# Patient Record
Sex: Female | Born: 1950
Health system: Southern US, Community
[De-identification: ages and names within clinical notes are randomized; demographics above are authoritative.]

## PROBLEM LIST (undated history)

## (undated) DIAGNOSIS — I219 Acute myocardial infarction, unspecified: Secondary | ICD-10-CM

## (undated) DIAGNOSIS — T8859XA Other complications of anesthesia, initial encounter: Secondary | ICD-10-CM

## (undated) DIAGNOSIS — E785 Hyperlipidemia, unspecified: Secondary | ICD-10-CM

## (undated) DIAGNOSIS — T7840XA Allergy, unspecified, initial encounter: Secondary | ICD-10-CM

## (undated) DIAGNOSIS — N390 Urinary tract infection, site not specified: Secondary | ICD-10-CM

## (undated) DIAGNOSIS — T4145XA Adverse effect of unspecified anesthetic, initial encounter: Secondary | ICD-10-CM

## (undated) DIAGNOSIS — H269 Unspecified cataract: Secondary | ICD-10-CM

## (undated) DIAGNOSIS — N393 Stress incontinence (female) (male): Secondary | ICD-10-CM

## (undated) DIAGNOSIS — Z9889 Other specified postprocedural states: Secondary | ICD-10-CM

## (undated) DIAGNOSIS — R112 Nausea with vomiting, unspecified: Secondary | ICD-10-CM

## (undated) DIAGNOSIS — M1990 Unspecified osteoarthritis, unspecified site: Secondary | ICD-10-CM

## (undated) DIAGNOSIS — R809 Proteinuria, unspecified: Secondary | ICD-10-CM

## (undated) DIAGNOSIS — K219 Gastro-esophageal reflux disease without esophagitis: Secondary | ICD-10-CM

## (undated) DIAGNOSIS — I209 Angina pectoris, unspecified: Secondary | ICD-10-CM

## (undated) HISTORY — PX: CARDIAC CATHETERIZATION: SHX172

## (undated) HISTORY — DX: Hyperlipidemia, unspecified: E78.5

## (undated) HISTORY — PX: POLYPECTOMY: SHX149

## (undated) HISTORY — DX: Allergy, unspecified, initial encounter: T78.40XA

## (undated) HISTORY — PX: CYSTOSCOPY: SUR368

## (undated) HISTORY — DX: Unspecified cataract: H26.9

## (undated) HISTORY — PX: COLONOSCOPY: SHX174

## (undated) HISTORY — PX: ABDOMINAL HYSTERECTOMY: SHX81

## (undated) HISTORY — PX: BACK SURGERY: SHX140

---

## 1998-04-19 ENCOUNTER — Ambulatory Visit (HOSPITAL_COMMUNITY): Admission: RE | Admit: 1998-04-19 | Discharge: 1998-04-19 | Payer: Self-pay

## 1999-04-15 ENCOUNTER — Ambulatory Visit (HOSPITAL_COMMUNITY): Admission: RE | Admit: 1999-04-15 | Discharge: 1999-04-15 | Payer: Self-pay | Admitting: Family Medicine

## 2000-02-21 ENCOUNTER — Emergency Department (HOSPITAL_COMMUNITY): Admission: EM | Admit: 2000-02-21 | Discharge: 2000-02-21 | Payer: Self-pay

## 2000-03-31 ENCOUNTER — Encounter: Payer: Self-pay | Admitting: Family Medicine

## 2000-03-31 ENCOUNTER — Encounter: Admission: RE | Admit: 2000-03-31 | Discharge: 2000-03-31 | Payer: Self-pay | Admitting: Family Medicine

## 2000-04-29 ENCOUNTER — Encounter: Payer: Self-pay | Admitting: Family Medicine

## 2000-04-29 ENCOUNTER — Ambulatory Visit (HOSPITAL_COMMUNITY): Admission: RE | Admit: 2000-04-29 | Discharge: 2000-04-29 | Payer: Self-pay | Admitting: Family Medicine

## 2000-05-13 ENCOUNTER — Encounter: Payer: Self-pay | Admitting: Family Medicine

## 2000-05-13 ENCOUNTER — Ambulatory Visit (HOSPITAL_COMMUNITY): Admission: RE | Admit: 2000-05-13 | Discharge: 2000-05-13 | Payer: Self-pay | Admitting: Family Medicine

## 2000-05-27 ENCOUNTER — Ambulatory Visit (HOSPITAL_COMMUNITY): Admission: RE | Admit: 2000-05-27 | Discharge: 2000-05-27 | Payer: Self-pay | Admitting: Family Medicine

## 2000-05-27 ENCOUNTER — Encounter: Payer: Self-pay | Admitting: Family Medicine

## 2000-08-11 ENCOUNTER — Encounter: Payer: Self-pay | Admitting: Family Medicine

## 2000-08-11 ENCOUNTER — Ambulatory Visit (HOSPITAL_COMMUNITY): Admission: RE | Admit: 2000-08-11 | Discharge: 2000-08-11 | Payer: Self-pay | Admitting: Family Medicine

## 2002-09-08 ENCOUNTER — Ambulatory Visit (HOSPITAL_COMMUNITY): Admission: RE | Admit: 2002-09-08 | Discharge: 2002-09-08 | Payer: Self-pay | Admitting: Family Medicine

## 2002-09-08 ENCOUNTER — Encounter: Payer: Self-pay | Admitting: Family Medicine

## 2002-09-21 ENCOUNTER — Encounter: Admission: RE | Admit: 2002-09-21 | Discharge: 2002-09-21 | Payer: Self-pay | Admitting: Family Medicine

## 2002-09-21 ENCOUNTER — Encounter: Payer: Self-pay | Admitting: Family Medicine

## 2003-10-21 DIAGNOSIS — I219 Acute myocardial infarction, unspecified: Secondary | ICD-10-CM

## 2003-10-21 HISTORY — DX: Acute myocardial infarction, unspecified: I21.9

## 2004-08-15 ENCOUNTER — Other Ambulatory Visit: Admission: RE | Admit: 2004-08-15 | Discharge: 2004-08-15 | Payer: Self-pay | Admitting: Gynecology

## 2004-08-20 ENCOUNTER — Ambulatory Visit (HOSPITAL_COMMUNITY): Admission: RE | Admit: 2004-08-20 | Discharge: 2004-08-20 | Payer: Self-pay | Admitting: Family Medicine

## 2004-09-02 ENCOUNTER — Ambulatory Visit (HOSPITAL_COMMUNITY): Admission: RE | Admit: 2004-09-02 | Discharge: 2004-09-02 | Payer: Self-pay | Admitting: Urology

## 2004-09-06 ENCOUNTER — Ambulatory Visit: Payer: Self-pay | Admitting: Oncology

## 2004-10-28 ENCOUNTER — Observation Stay (HOSPITAL_COMMUNITY): Admission: EM | Admit: 2004-10-28 | Discharge: 2004-10-29 | Payer: Self-pay | Admitting: Emergency Medicine

## 2005-02-20 ENCOUNTER — Emergency Department (HOSPITAL_COMMUNITY): Admission: EM | Admit: 2005-02-20 | Discharge: 2005-02-20 | Payer: Self-pay | Admitting: Emergency Medicine

## 2005-03-24 ENCOUNTER — Ambulatory Visit: Payer: Self-pay | Admitting: Gastroenterology

## 2006-09-01 ENCOUNTER — Ambulatory Visit (HOSPITAL_COMMUNITY): Admission: RE | Admit: 2006-09-01 | Discharge: 2006-09-01 | Payer: Self-pay | Admitting: Family Medicine

## 2006-09-03 ENCOUNTER — Other Ambulatory Visit: Admission: RE | Admit: 2006-09-03 | Discharge: 2006-09-03 | Payer: Self-pay | Admitting: Family Medicine

## 2007-06-28 ENCOUNTER — Ambulatory Visit (HOSPITAL_COMMUNITY): Admission: RE | Admit: 2007-06-28 | Discharge: 2007-06-28 | Payer: Self-pay | Admitting: Gynecology

## 2007-08-23 ENCOUNTER — Ambulatory Visit (HOSPITAL_COMMUNITY): Admission: RE | Admit: 2007-08-23 | Discharge: 2007-08-23 | Payer: Self-pay | Admitting: Gynecology

## 2007-09-06 ENCOUNTER — Ambulatory Visit (HOSPITAL_COMMUNITY): Admission: RE | Admit: 2007-09-06 | Discharge: 2007-09-06 | Payer: Self-pay | Admitting: Family Medicine

## 2007-12-28 ENCOUNTER — Ambulatory Visit: Payer: Self-pay | Admitting: Gastroenterology

## 2008-01-11 ENCOUNTER — Ambulatory Visit: Payer: Self-pay | Admitting: Gastroenterology

## 2008-01-11 ENCOUNTER — Encounter: Payer: Self-pay | Admitting: Gastroenterology

## 2009-05-17 ENCOUNTER — Ambulatory Visit (HOSPITAL_COMMUNITY): Admission: RE | Admit: 2009-05-17 | Discharge: 2009-05-18 | Payer: Self-pay | Admitting: Neurological Surgery

## 2009-10-16 ENCOUNTER — Inpatient Hospital Stay (HOSPITAL_COMMUNITY): Admission: RE | Admit: 2009-10-16 | Discharge: 2009-10-19 | Payer: Self-pay | Admitting: Neurological Surgery

## 2010-11-09 ENCOUNTER — Encounter: Payer: Self-pay | Admitting: *Deleted

## 2010-11-09 ENCOUNTER — Encounter: Payer: Self-pay | Admitting: Family Medicine

## 2010-11-10 ENCOUNTER — Encounter: Payer: Self-pay | Admitting: Family Medicine

## 2010-11-10 ENCOUNTER — Encounter: Payer: Self-pay | Admitting: Gynecology

## 2011-01-20 LAB — CBC
HCT: 39.9 % (ref 36.0–46.0)
Hemoglobin: 13.6 g/dL (ref 12.0–15.0)
MCHC: 34.2 g/dL (ref 30.0–36.0)
MCV: 89.8 fL (ref 78.0–100.0)
Platelets: 254 10*3/uL (ref 150–400)
RBC: 4.44 MIL/uL (ref 3.87–5.11)
RDW: 13.1 % (ref 11.5–15.5)
WBC: 5.2 10*3/uL (ref 4.0–10.5)

## 2011-01-20 LAB — ABO/RH: ABO/RH(D): A POS

## 2011-01-20 LAB — GLUCOSE, CAPILLARY
Glucose-Capillary: 110 mg/dL — ABNORMAL HIGH (ref 70–99)
Glucose-Capillary: 116 mg/dL — ABNORMAL HIGH (ref 70–99)
Glucose-Capillary: 121 mg/dL — ABNORMAL HIGH (ref 70–99)
Glucose-Capillary: 130 mg/dL — ABNORMAL HIGH (ref 70–99)
Glucose-Capillary: 136 mg/dL — ABNORMAL HIGH (ref 70–99)

## 2011-01-20 LAB — TYPE AND SCREEN
ABO/RH(D): A POS
Antibody Screen: NEGATIVE

## 2011-01-20 LAB — BASIC METABOLIC PANEL
BUN: 13 mg/dL (ref 6–23)
CO2: 23 mEq/L (ref 19–32)
Calcium: 9.2 mg/dL (ref 8.4–10.5)
Chloride: 106 mEq/L (ref 96–112)
Creatinine, Ser: 0.7 mg/dL (ref 0.4–1.2)
GFR calc Af Amer: >60 mL/min (ref >60)
GFR calc non Af Amer: >60 mL/min (ref >60)
Glucose, Bld: 141 mg/dL — ABNORMAL HIGH (ref 70–99)
Potassium: 4.4 mEq/L (ref 3.5–5.1)
Sodium: 139 mEq/L (ref 135–145)

## 2011-01-26 LAB — BASIC METABOLIC PANEL
CO2: 28 mEq/L (ref 19–32)
Calcium: 9.5 mg/dL (ref 8.4–10.5)
Chloride: 107 mEq/L (ref 96–112)
Creatinine, Ser: 0.69 mg/dL (ref 0.4–1.2)
GFR calc Af Amer: >60 mL/min (ref >60)
Glucose, Bld: 162 mg/dL — ABNORMAL HIGH (ref 70–99)
Sodium: 140 mEq/L (ref 135–145)

## 2011-01-26 LAB — GLUCOSE, CAPILLARY
Glucose-Capillary: 120 mg/dL — ABNORMAL HIGH (ref 70–99)
Glucose-Capillary: 125 mg/dL — ABNORMAL HIGH (ref 70–99)
Glucose-Capillary: 163 mg/dL — ABNORMAL HIGH (ref 70–99)

## 2011-01-26 LAB — CBC
Hemoglobin: 13.9 g/dL (ref 12.0–15.0)
MCHC: 34.6 g/dL (ref 30.0–36.0)
MCV: 89 fL (ref 78.0–100.0)
RBC: 4.52 MIL/uL (ref 3.87–5.11)
RDW: 13.3 % (ref 11.5–15.5)

## 2011-03-04 NOTE — Op Note (Signed)
NAMEALISI, LUPIEN            ACCOUNT NO.:  [id number]   MEDICAL RECORD NO.:  [id number]          PATIENT TYPE:  OIB   LOCATION:  3535                         FACILITY:  [facility]   PHYSICIAN:  Stefani Dama, M.D.  DATE OF BIRTH:  24-Feb-1951   DATE OF PROCEDURE:  05/17/2009  DATE OF DISCHARGE:                               OPERATIVE REPORT   PREOPERATIVE DIAGNOSES:  Herniated nucleus pulposus, spondylosis L5-S1  with right lumbar radiculopathy.   POSTOPERATIVE DIAGNOSES:  Herniated nucleus pulposus, spondylosis L5-S1  with right lumbar radiculopathy.   PROCEDURE:  Right METRx laminotomy and foraminotomy at L5-S1 with  operating microscope, microdissection technique.   SURGEON:  Stefani Dama, MD   FIRST ASSISTANT:  Cristi Loron, MD   ANESTHESIA:  General endotracheal.   INDICATIONS:  Jennice Renegar is a 60 year old individual who has had  problems with her right lumbar radiculopathy.  She has spondylitic  change at the L5-S1 level and has the lateral recess stenosis that is  felt to be due to a chronic disk herniation with perhaps in acute  fragment.  This problem has been refractory to conservative measures of  all sorts over period of a couple of years.  Because she has had  persistence of L5 and S1 radicular syndrome, she is been advised  regarding surgical decompression.   PROCEDURE:  The patient was brought to the operating room supine on the  stretcher.  After smooth induction of general endotracheal anesthesia,  she was turned prone.  The back was prepped with alcohol and DuraPrep  and draped in a sterile fashion.  Fluoroscopic guidance was used to  localize the L5-S1 interspace on the right side and then the skin above  this area was infiltrated with 10 mL of lidocaine with epinephrine mixed  50:50 with 0.5% Marcaine.  A small incision was created over the area of  the laminotomy and then a K-wire was passed under fluoroscopic guidance  to the laminar  arch of L5.  A #15 blade was used to make a small  vertical incision in the lumbodorsal fascia and then a series of  dilators were passed and a winding technique was used to dilate the  prevertebral area and the interlaminar space at L5-S1 using again  fluoroscopic guide.  The wound was then secured with an 18 mm diameter x  5 cm deep endoscopic cannula which was fixed to the operating table with  a clamp.  The inner tubes were removed and the microscope was draped and  brought into the field to look into the small aperture to the  interlaminar space at L5-S1.  Then, a monopolar cautery was used to  clear the soft tissues from the edges of the bone and to expose the L5  laminar arch and including the medial wall of the facet.  A high-speed  drill and 2.3-mm dissecting tool was then used to clear the inferior  margin of the lamina and medial wall of the facet and large laminotomy.  The 2 and 3 mm Kerrison punches were then used to lift and remove the  yellow ligament.  In the lateral recess, the S1 nerve root was  identified being flattened over a significant mass ventral to it.  Once  an adequate opening in the yellow ligament and partial resection of the  facet joint was obtained, the nerve root could be mobilized medially and  then the bipolar cautery was used to cauterize some epidural veins.  The  nerve root was retracted medially and the underlying this was noted to  be the disk space.  There was a solid ridge of bone in the inferior  margin body of L5 to the superior margin of the sacrum.  Palpation and  exploration of this yielded no evidence of loosened ligament or fragment  of disk.  This was felt to be a chronic process that was quite hardened.  In the light of the fact that the ligament was intact over this bony  ridge, a diskectomy was not performed.  However, a more generous  foraminotomy was performed to decompress the S1 nerve root both  inferiorly and superiorly.  Once the  lateral recess was cleared and the  area was secured, hemostasis in the soft tissues was obtained  meticulously.  The microscope was removed from the field.  The  endoscopic cannula was removed and then the lumbodorsal fascia was  closed with 3-0 Vicryl sutures in an interrupted fashion, 3-0 Vicryl  using subcuticular tissues, and Dermabond was used on the skin.  Blood  loss was nil.  The patient tolerated the procedure well and was returned  to the recovery room in stable condition.      Stefani Dama, M.D.  Electronically Signed     HJE/MEDQ  D:  05/17/2009  T:  05/18/2009  Job:  [id number]

## 2011-03-07 NOTE — H&P (Signed)
NAMEMAEDELL, HEDGER            ACCOUNT NO.:  [id number]   MEDICAL RECORD NO.:  [id number]          PATIENT TYPE:  EMS   LOCATION:  ED                           FACILITY:  [facility]   PHYSICIAN:  Sherin Quarry, MD      DATE OF BIRTH:  05-22-1951   DATE OF ADMISSION:  10/27/2004  DATE OF DISCHARGE:                                HISTORY & PHYSICAL   CHIEF COMPLAINT:  Regina Kim is a 60 year old lady who is generally in  good health. She reports that at about 6 p.m. tonight, she ate two bites of  pizza and then began to experience severe substernal chest pain radiating to  her right shoulder and down her right arm.  This was associated with nausea  and diaphoresis.  There was no associated vomiting.  The pain persisted and  eventually her husband brought her to the emergency room about 8 p.m.Marland Kitchen  At  that time she received one sublingual nitroglycerin which apparently did not  have any effect on her symptoms.  She says that her husband then began  rubbing her back and that seemed to make the pain gradually resolve.  At  this time she is having no chest pain or shortness of breath.  She feels as  though there is a burning sensation in the area between her shoulder blades.  Initial cardiac enzymes were remarkable for troponin value of 0.28 on the  initial specimen.  Subsequent specimens were 0.13 and 0.13.  The patient  will, therefore, be admitted at this time for evaluation of this episode of  chest pain.  While in the emergency room, a chest x-ray was obtained which  was remarkable for evidence of bronchitis.  Also a CT scan of the chest was  done which showed no evidence of  pulmonary embolus.  Also possibly of  significance is that the patient has had a two-week history of coughing.  Her cough is described as nonproductive.  She has presented to Dr. Pablo Lawrence  office about 10 days ago and was treated with Tussionex cough syrup and Z-  pack.  She has continued to have some  coughing.   PAST MEDICAL HISTORY:  She had had a hysterectomy in the past.  She cannot  recall having had any other operations.  There have been no other medical  illnesses.   MEDICATIONS:  1.  Prilosec over-the-counter one tablet daily.  2.  A fiber supplement.  3.  Ibuprofen 800 mg p.r.n. for back pain.   FAMILY HISTORY:  Significant in that the patient has seven brothers and four  sisters. Three of her brothers have had myocardial infarctions.  Two of her  brothers have had CABG.  Her father died at the age of 53 of an MI.   SOCIAL HISTORY:  The patient states that she does not smoke or abuse  alcohol.  She has not used any other drugs.  She lives with her husband.  She indicates that she is very sedentary, does not do any type of regular  exercise.   REVIEW OF SYSTEMS:  HEAD:  She denies headache or  dizziness.  EYES:  She  denies visual, blurring or diplopia.  ENT:  Denies earaches, sinus pain or  sore throat.  CHEST:  Denies productive cough.  Otherwise see above.  CARDIOVASCULAR:  Denies orthopnea, PND or ankle edema.  GI:  There has been  no history of acid reflux.  She denies chronic abdominal pain.  There has  been no hematemesis or melena.  GU:  Denies dysuria, urinary frequency.  NEUROLOGIC: Denies history of seizure or stroke.  ENDO:  Denies excessive  urinary frequency or nocturia.   PHYSICAL EXAMINATION:  GENERAL:  She is an obese woman who is currently  feeling well.  VITAL SIGNS:  Temperature 98.4, blood pressure 142/98, pulse 66,  respirations 18, O2 saturation 99%.  HEENT: Within normal limits.  CHEST:  Clear to auscultation and percussion.  BACK:  Examination of the back reveals no CVA or point tenderness.  CARDIOVASCULAR:  Normal S1 and S2.  No murmurs, rubs, or gallops.  ABDOMEN:  Benign.  Audible bowel sounds. No masses or tenderness.  No  guarding or rebound.  NEUROLOGIC: On neurologic testing, examination of the extremities is normal.   LABORATORY  DATA:  Remarkable for a CBC which shows hemoglobin 12.3, white  count 5900.  Sodium 138, potassium 3.6.  Of note is that the glucose is 142.  Troponin values are noted above.   IMPRESSION:  1.  Chest pain with abnormal troponin.  It is very significant that this      patient also has a strong family heart disease.  With these      considerations, the patient needs to be admitted for further evaluation      of her complaints of chest pain.  2.  History of gestational diabetes.  3.  Abnormal glucose on admission.  4.  Status post hysterectomy.   The patient will be admitted for telemetry monitoring.  Will obtain  additional cardiac enzymes values.  Standard rule out myocardial infarction  protocol will be initiated.  The patient will be placed on aspirin and  Lovenox.  Lopressor 25 mg every 12 hours will be initiated.  Consultation  will be obtained with [facility] cardiology for further evaluation, presumably  for Cardiolite stress.      SY/MEDQ  D:  10/28/2004  T:  10/28/2004  Job:  [id number]   cc:   Molly Maduro L. Foy Guadalajara, M.D.  [address] [place]  [place] [address]  Fax: [phone]

## 2011-03-07 NOTE — Consult Note (Signed)
[name], [name]            ACCOUNT NO.:  [id number]   MEDICAL RECORD NO.:  [id number]          PATIENT TYPE:  INP   LOCATION:  6524                         FACILITY:  [facility]   PHYSICIAN:  Meade Maw, M.D.    DATE OF BIRTH:  Nov 18, 1950   DATE OF CONSULTATION:  10/28/2004  DATE OF DISCHARGE:                                   CONSULTATION   REASON FOR CONSULTATION:  Chest pain.   Regina Kim is a very pleasant 60 year old female who presented to the emergency  room  following a two hour episode of substernal chest pain which initially  started as a pain in her right shoulder and back which radiated to her  front.  This was associated with nausea and diaphoresis.  The patient  actually went outside in an attempt to cool down.  The pain persisted.  Her  husband took her blood pressure and noted it was 185/100.  She subsequently  decided to come to the emergency room.  In the emergency room, she received  1 sublingual nitroglycerin.  The pain gradually resolved.  She is unsure as  to what was the alleviating factor.  She has had no further chest pain since  her admission.  Her initial cardiac enzymes were remarkable for an elevated  troponin as well as an elevated CK of 314 with an MB fraction of 31.  Her  relative index was 9.9.  Her troponin I is 1.99.   PAST MEDICAL HISTORY:  1.  Significant for recent upper respiratory infection.  She was treated for      bronchitis as an outpatient with a Z-Pak.  2.  Gastritis.   PAST SURGICAL HISTORY:  Significant for a hysterectomy.   CURRENT MEDICATIONS:  Prilosec over the counter, ibuprofen p.r.n. for back  pain.   FAMILY HISTORY:  Significant in that the patient has seven brothers and four  sisters, three of the brothers have had myocardial infarction, two of her  brothers have had coronary artery bypass graft.  Her father passed at age 25  from myocardial infarction.   SOCIAL HISTORY:  The patient stopped smoking in September 2005,  no history  of alcohol or illicit drug use.  She lives with her husband.  She works as  Hotel manager.  She is not involved in a regular exercise program, but  she states she is active with her job.   REVIEW OF SYMPTOMS:  As noted above, significant for recent bronchitis, she  has had no palpitations, no presyncope, and no syncope.  No orthopnea, no  pedal edema.   PHYSICAL EXAMINATION:  GENERAL:  Middle aged female in no acute distress.  VITAL SIGNS:  She is afebrile.  Her systolic pressure has ranged from 135 to  140 with a diastolic of 70 to 80.  Heart rate has been in the 60s to 70s.  Her O2 saturation has been 98% on 2 liters.  HEENT:  Unremarkable.  PULMONARY EXAM:  Breath sounds which are equal and clear to auscultation.  No use of accessory muscles.  CARDIOVASCULAR EXAM:  Regular rate and rhythm, normal S1 and  normal S2, no  murmurs, gallops, and rubs noted.  ABDOMEN:  Soft, benign, nontender.  EXTREMITIES:  Distal pulses which are equal and palpable.   LABORATORY DATA:  White count 5.9, hemoglobin 12.3, hematocrit 36, platelet  count 206.  Sodium 138, potassium 3.6, creatinine 0.8.  CK scan of the chest  was performed and this was noted to be negative for acute pulmonary embolus.   MEDICATIONS SINCE ADMISSION:  Lovenox per pharmacy protocol, Lopressor 25 mg  p.o. q.12h., Tylenol p.r.n., sublingual nitroglycerin, Restoril p.o. q.8h.  p.r.n., Ativan p.r.n., Protonix 40 mg daily.   IMPRESSION:  76.  60 year old female with a strong family history of coronary artery      disease and remote history of tobacco use.  She has had elevations in      her troponin Is as well as her Cks.  Left heart catheterization has been      recommended.  The risks, benefits, and options have been discussed.  The      patient wishes to proceed with left heart catheterization.  I will      premedicate with prednisone secondary to questionable allergy to      anesthetic material, drugs unknown.   The patient reports that she had a      respiratory affect, therefore, will treat empirically.  2.  Hypertension, blood pressure is currently well controlled.  3.  Health maintenance, cholesterol profile will need to be obtained for      further evaluation.      HP/MEDQ  D:  10/28/2004  T:  10/28/2004  Job:  [id number]   cc:   Sherin Quarry, MD   Doris Cheadle. Foy Guadalajara, M.D.  [address] [place]  [place] [address]  Fax: [phone]

## 2011-03-07 NOTE — Cardiovascular Report (Signed)
NAMEGERARDO, TERRITO            ACCOUNT NO.:  [id number]   MEDICAL RECORD NO.:  [id number]          PATIENT TYPE:  INP   LOCATION:  6524                         FACILITY:  [facility]   PHYSICIAN:  Meade Maw, M.D.    DATE OF BIRTH:  09/26/1951   DATE OF PROCEDURE:  10/29/2004  DATE OF DISCHARGE:                              CARDIAC CATHETERIZATION   REASON FOR PROCEDURE:  Chest pain with abnormal troponins, elevation of CK-  MB and ST depression noted on 12-lead EKG.   PROCEDURE:  After obtaining written informed consent, the patient was  brought to the cardiac catheterization lab in the post-absorptive state.  Preoperative sedation was achieved using Valium p.o. The patient was  premedicated for possible allergic reaction to IV sedation with prednisone  60 mg p.o. 18 hours prior to the procedure. The right groin was prepped and  draped in the usual sterile fashion. Local anesthesia was achieved using 1%  Xylocaine. A 6-French hemostasis sheath was placed into the right femoral  artery using a modified Seldinger technique. Selective coronary angiography  was performed using a 6-French JL4 and a non-torque JR4 catheter. Multiple  views were obtained. All catheter exchanges were made over a guide wire. The  hemostasis sheath was placed on each catheter exchange. Single plane  ventriculogram was performed in the RAO position using a 6-French pigtail  curved catheter. There was no immediate complications. The patient was  transferred to the holding area. Hemostasis was achieved using FemoStop  device. There was no immediate complications.   FINDINGS:  LV pressure was 130/10, EDP 17, aortic pressure was 125/75.  Single plane ventriculogram revealed normal wall motion, ejection fraction  of 75%.   Coronary angiography:  The left main coronary artery bifurcates into the  left anterior descending and circumflex vessel. There is no disease noted in  the left main coronary artery.   Left  anterior descending:  The left anterior descending is a large caliber  artery, gives rise to a large D1. Trivial D2 goes on to end as an apical  branch. There is no disease noted in the left anterior artery descending or  its branches.   Circumflex vessel:  Circumflex vessel is codominant for the posterior  circulation and gives rise to a trivial OM1, small OM2, small OM3, large  OM4, large OM5. It then goes on to end as an AV groove vessel. There is no  disease noted in the circumflex or its branches.   Right coronary artery:  Right coronary artery is codominant for the  posterior circulation and gives rise to a trivial RV marginal, a small to  moderate PDA, and a moderate PL branch. There is luminal irregularities in  the proximal right ________________.   FINAL IMPRESSION:  1.  Normal coronary angiography. Normal single plane ventriculogram.      Ejection fraction 65%.  2.  Elevation in cardiac enzymes possibly from coronary spasm. Will monitor      at this time. May need to consider calcium-channel blockers should the      patient have recurrence. We will also obtain repeat cardiac enzymes in  two weeks to determine if the patient is someone who has a false      positive elevation at baseline.      HP/MEDQ  D:  10/29/2004  T:  10/29/2004  Job:  [id number]   cc:   Molly Maduro L. Foy Guadalajara, M.D.  [address] [place]  [place] [address]  Fax: [phone]

## 2011-05-01 ENCOUNTER — Encounter (HOSPITAL_COMMUNITY)
Admission: RE | Admit: 2011-05-01 | Discharge: 2011-05-01 | Disposition: A | Discharge status: Home / Self Care | Payer: 59 | Source: Ambulatory Visit | Attending: Obstetrics and Gynecology | Admitting: Obstetrics and Gynecology

## 2011-05-01 ENCOUNTER — Encounter (HOSPITAL_COMMUNITY): Payer: Self-pay

## 2011-05-01 ENCOUNTER — Other Ambulatory Visit: Payer: Self-pay

## 2011-05-01 HISTORY — DX: Adverse effect of unspecified anesthetic, initial encounter: T41.45XA

## 2011-05-01 HISTORY — DX: Other complications of anesthesia, initial encounter: T88.59XA

## 2011-05-01 HISTORY — DX: Gastro-esophageal reflux disease without esophagitis: K21.9

## 2011-05-01 HISTORY — DX: Acute myocardial infarction, unspecified: I21.9

## 2011-05-01 HISTORY — DX: Angina pectoris, unspecified: I20.9

## 2011-05-01 HISTORY — DX: Unspecified osteoarthritis, unspecified site: M19.90

## 2011-05-01 LAB — CBC
Hemoglobin: 12.6 g/dL (ref 12.0–15.0)
MCH: 29.4 pg (ref 26.0–34.0)
MCHC: 32.8 g/dL (ref 30.0–36.0)
Platelets: 212 10*3/uL (ref 150–400)
RDW: 12.9 % (ref 11.5–15.5)

## 2011-05-01 LAB — BASIC METABOLIC PANEL
CO2: 24 mEq/L (ref 19–32)
Calcium: 9.4 mg/dL (ref 8.4–10.5)
Creatinine, Ser: 0.85 mg/dL (ref 0.50–1.10)
GFR calc non Af Amer: >60 mL/min (ref >60)
Sodium: 138 mEq/L (ref 135–145)

## 2011-05-01 LAB — URINALYSIS, ROUTINE W REFLEX MICROSCOPIC
Glucose, UA: NEGATIVE mg/dL
Ketones, ur: NEGATIVE mg/dL
Leukocytes, UA: NEGATIVE
Nitrite: NEGATIVE
Protein, ur: NEGATIVE mg/dL
Urobilinogen, UA: 0.2 mg/dL (ref 0.0–1.0)

## 2011-05-01 NOTE — Patient Instructions (Signed)
20 Regina Kim  05/01/2011   Your procedure is scheduled on:  05/08/11  Report to [facility] at 0600 AM.  Call this number if you have problems the morning of surgery: [phone]   Remember:   Do not eat food:After Midnight.  Do not drink clear liquids: After Midnight.  Take these medicines the morning of surgery with A SIP OF WATER: none   Do not wear jewelry, make-up or nail polish.  Do not bring valuables to the hospital.  Contacts, dentures or bridgework may not be worn into surgery.  Leave suitcase in the car. After surgery it may be brought to your room.  For patients admitted to the hospital, checkout time is 11:00 AM the day of discharge.   Patients discharged the day of surgery will not be allowed to drive home.  Name and phone number of your driver: Regina Kim- spouse  Special Instructions:     Please read over the following fact sheets that you were given: none

## 2011-05-01 NOTE — Anesthesia Preprocedure Evaluation (Addendum)
Anesthesia Evaluation  Name, MR# and DOB Patient awake  General Assessment Comment  Reviewed: Allergy & Precautions, H&P  and Patient's Chart, lab work & pertinent test results  History of Anesthesia Complications History of anesthetic complications: Asthmatic Attack with cystoscopy 30 yrs ago. Was a smoker at that time.  Airway Mallampati: II TM Distance: >3 FB Neck ROM: Full    Dental  (+) Teeth Intact   Pulmonaryneg pulmonary ROS      pulmonary exam normal   Cardiovascular Regular Normal   Neuro/PsychNegative Neurological ROS Negative Psych ROS  GI/Hepatic/Renal negative GI ROS, negative Liver ROS, and negative Renal ROS (+)       Endo/Other  Negative Endocrine ROS (+)   Abdominal Normal abdominal exam  (+)   Musculoskeletal negative musculoskeletal ROS (+)  Hematology negative hematology ROS (+)   Peds  Reproductive/Obstetrics negative OB ROS   Anesthesia Other Findings             Anesthesia Physical Anesthesia Plan  ASA: II  Anesthesia Plan: General   Post-op Pain Management:    Induction: Intravenous  Airway Management Planned: LMA  Additional Equipment:   Intra-op Plan:   Post-operative Plan:   Informed Consent: I have reviewed the patients History and Physical, chart, labs and discussed the procedure including the risks, benefits and alternatives for the proposed anesthesia with the patient or authorized representative who has indicated his/her understanding and acceptance.   Dental advisory given  Plan Discussed with: Anesthesiologist (AP)  Anesthesia Plan Comments:         Anesthesia Quick Evaluation

## 2011-05-08 ENCOUNTER — Encounter (HOSPITAL_COMMUNITY): Payer: Self-pay | Admitting: Anesthesiology

## 2011-05-08 ENCOUNTER — Encounter (HOSPITAL_COMMUNITY): Payer: Self-pay | Admitting: Obstetrics and Gynecology

## 2011-05-08 ENCOUNTER — Ambulatory Visit (HOSPITAL_COMMUNITY): Payer: 59 | Admitting: Anesthesiology

## 2011-05-08 ENCOUNTER — Ambulatory Visit (HOSPITAL_COMMUNITY)
Admission: AD | Admit: 2011-05-08 | Discharge: 2011-05-08 | Disposition: A | Discharge status: Home / Self Care | Payer: 59 | Source: Ambulatory Visit | Attending: Obstetrics and Gynecology | Admitting: Obstetrics and Gynecology

## 2011-05-08 ENCOUNTER — Encounter (HOSPITAL_COMMUNITY)
Admission: AD | Disposition: A | Discharge status: Home / Self Care | Payer: Self-pay | Source: Ambulatory Visit | Attending: Obstetrics and Gynecology

## 2011-05-08 DIAGNOSIS — Z01812 Encounter for preprocedural laboratory examination: Secondary | ICD-10-CM | POA: Insufficient documentation

## 2011-05-08 DIAGNOSIS — Z01818 Encounter for other preprocedural examination: Secondary | ICD-10-CM | POA: Insufficient documentation

## 2011-05-08 DIAGNOSIS — N393 Stress incontinence (female) (male): Secondary | ICD-10-CM | POA: Diagnosis present

## 2011-05-08 HISTORY — DX: Stress incontinence (female) (male): N39.3

## 2011-05-08 HISTORY — PX: BLADDER SURGERY: SHX569

## 2011-05-08 HISTORY — PX: BLADDER SUSPENSION: SHX72

## 2011-05-08 SURGERY — URETHROPEXY, USING TRANSVAGINAL TAPE
Anesthesia: General | Site: Vagina | Wound class: Clean Contaminated

## 2011-05-08 MED ORDER — LACTATED RINGERS IV SOLN
INTRAVENOUS | Status: DC
  Administered 2011-05-08 (×3): via INTRAVENOUS

## 2011-05-08 MED ORDER — OXYCODONE-ACETAMINOPHEN 5-325 MG PO TABS
ORAL_TABLET | ORAL | Status: AC
  Administered 2011-05-08: 2 via ORAL
  Filled 2011-05-08: qty 2

## 2011-05-08 MED ORDER — ACETAMINOPHEN 325 MG PO TABS
325.0000 mg | ORAL_TABLET | ORAL | Status: DC | PRN
Start: 2011-05-08 — End: 2011-05-08

## 2011-05-08 MED ORDER — PROMETHAZINE HCL 25 MG/ML IJ SOLN: 6.2500 mg | INTRAMUSCULAR | Status: DC | PRN

## 2011-05-08 MED ORDER — ONDANSETRON HCL 4 MG/2ML IJ SOLN
INTRAMUSCULAR | Status: AC
  Filled 2011-05-08: qty 2

## 2011-05-08 MED ORDER — HYDROMORPHONE HCL 1 MG/ML IJ SOLN
INTRAMUSCULAR | Status: AC
  Filled 2011-05-08: qty 1

## 2011-05-08 MED ORDER — PROPOFOL 10 MG/ML IV EMUL
INTRAVENOUS | Status: AC
  Filled 2011-05-08: qty 20

## 2011-05-08 MED ORDER — SODIUM CHLORIDE 0.9 % IJ SOLN
INTRAMUSCULAR | Status: DC | PRN
  Administered 2011-05-08: 100 mL

## 2011-05-08 MED ORDER — INDIGOTINDISULFONATE SODIUM 8 MG/ML IJ SOLN
INTRAMUSCULAR | Status: DC | PRN
  Administered 2011-05-08: 40 mg via INTRAVENOUS

## 2011-05-08 MED ORDER — HYDROMORPHONE HCL 1 MG/ML IJ SOLN: 0.2500 mg | INTRAMUSCULAR | Status: DC | PRN

## 2011-05-08 MED ORDER — MEPERIDINE HCL 25 MG/ML IJ SOLN: 6.2500 mg | INTRAMUSCULAR | Status: DC | PRN

## 2011-05-08 MED ORDER — KETOROLAC TROMETHAMINE 30 MG/ML IJ SOLN
INTRAMUSCULAR | Status: AC
  Filled 2011-05-08: qty 1

## 2011-05-08 MED ORDER — PROPOFOL 10 MG/ML IV EMUL
INTRAVENOUS | Status: DC | PRN
  Administered 2011-05-08: 20 mg via INTRAVENOUS
  Administered 2011-05-08: 180 mg via INTRAVENOUS

## 2011-05-08 MED ORDER — DEXAMETHASONE SODIUM PHOSPHATE 10 MG/ML IJ SOLN
INTRAMUSCULAR | Status: AC
  Filled 2011-05-08: qty 1

## 2011-05-08 MED ORDER — SODIUM CHLORIDE 0.9 % IR SOLN
Status: DC | PRN
  Administered 2011-05-08: 1000 mL

## 2011-05-08 MED ORDER — LABETALOL HCL 5 MG/ML IV SOLN
INTRAVENOUS | Status: AC
  Filled 2011-05-08: qty 4

## 2011-05-08 MED ORDER — LIDOCAINE-EPINEPHRINE 0.5-1:200000 % IJ SOLN
INTRAMUSCULAR | Status: DC | PRN
  Administered 2011-05-08: 100 mL

## 2011-05-08 MED ORDER — FENTANYL CITRATE 0.05 MG/ML IJ SOLN
INTRAMUSCULAR | Status: AC
  Filled 2011-05-08: qty 2

## 2011-05-08 MED ORDER — MIDAZOLAM HCL 5 MG/5ML IJ SOLN
INTRAMUSCULAR | Status: DC | PRN
  Administered 2011-05-08: 2 mg via INTRAVENOUS

## 2011-05-08 MED ORDER — MIDAZOLAM HCL 2 MG/2ML IJ SOLN
INTRAMUSCULAR | Status: AC
  Filled 2011-05-08: qty 2

## 2011-05-08 MED ORDER — CEFAZOLIN SODIUM 1-5 GM-% IV SOLN
INTRAVENOUS | Status: AC
  Filled 2011-05-08: qty 50

## 2011-05-08 MED ORDER — ACETAMINOPHEN 10 MG/ML IV SOLN: 1000.0000 mg | Freq: Once | INTRAVENOUS | Status: DC | PRN

## 2011-05-08 MED ORDER — STERILE WATER FOR IRRIGATION IR SOLN
Status: DC | PRN
  Administered 2011-05-08: 1000 mL

## 2011-05-08 MED ORDER — METOCLOPRAMIDE HCL 5 MG/ML IJ SOLN
10.0000 mg | Freq: Once | INTRAMUSCULAR | Status: AC
  Administered 2011-05-08: 10 mg via INTRAVENOUS

## 2011-05-08 MED ORDER — FENTANYL CITRATE 0.05 MG/ML IJ SOLN
INTRAMUSCULAR | Status: DC | PRN
  Administered 2011-05-08 (×2): 50 ug via INTRAVENOUS

## 2011-05-08 MED ORDER — CEFAZOLIN SODIUM 1-5 GM-% IV SOLN
INTRAVENOUS | Status: DC | PRN
  Administered 2011-05-08: 1 g via INTRAVENOUS

## 2011-05-08 MED ORDER — LIDOCAINE HCL (CARDIAC) 20 MG/ML IV SOLN
INTRAVENOUS | Status: AC
  Filled 2011-05-08: qty 5

## 2011-05-08 MED ORDER — METOCLOPRAMIDE HCL 5 MG/ML IJ SOLN
INTRAMUSCULAR | Status: AC
  Administered 2011-05-08: 10 mg via INTRAVENOUS
  Filled 2011-05-08: qty 2

## 2011-05-08 MED ORDER — LIDOCAINE HCL (CARDIAC) 20 MG/ML IV SOLN
INTRAVENOUS | Status: DC | PRN
  Administered 2011-05-08 (×2): 30 mg via INTRAVENOUS

## 2011-05-08 MED ORDER — CEFAZOLIN SODIUM 1-5 GM-% IV SOLN: 1.0000 g | INTRAVENOUS | Status: DC

## 2011-05-08 SURGICAL SUPPLY — 29 items
BLADE SURG 11 STRL SS (BLADE) ×1 IMPLANT
BLADE SURG 15 STRL LF C SS BP (BLADE) ×1 IMPLANT
BLADE SURG 15 STRL SS (BLADE) ×2
BLADE SURG CLIPPER 3M 9600 (MISCELLANEOUS) ×1 IMPLANT
CATH ROBINSON RED A/P 16FR (CATHETERS) ×2 IMPLANT
CLOTH BEACON ORANGE TIMEOUT ST (SAFETY) ×2 IMPLANT
DECANTER SPIKE VIAL GLASS SM (MISCELLANEOUS) ×1 IMPLANT
DERMABOND ADVANCED (GAUZE/BANDAGES/DRESSINGS) ×1 IMPLANT
DISSECTOR SPONGE CHERRY (GAUZE/BANDAGES/DRESSINGS) IMPLANT
DRAPE CAMERA CLOSED 9X96 (DRAPES) ×2 IMPLANT
DRAPE HYSTEROSCOPY (DRAPE) ×2 IMPLANT
DRAPE UTILITY XL STRL (DRAPES) ×2 IMPLANT
GAUZE PACKING 2X5 YD STERILE (GAUZE/BANDAGES/DRESSINGS) IMPLANT
GLOVE BIO SURGEON STRL SZ8 (GLOVE) ×4 IMPLANT
GLOVE INDICATOR 6.5 STRL GRN (GLOVE) ×2 IMPLANT
GOWN BRE IMP SLV AUR LG STRL (GOWN DISPOSABLE) ×6 IMPLANT
GOWN BRE IMP SLV AUR XL STRL (GOWN DISPOSABLE) ×2 IMPLANT
NEEDLE HYPO 22GX1.5 SAFETY (NEEDLE) ×2 IMPLANT
NS IRRIG 1000ML POUR BTL (IV SOLUTION) ×2 IMPLANT
PACK VAGINAL WOMENS (CUSTOM PROCEDURE TRAY) ×2 IMPLANT
SET CYSTO W/LG BORE CLAMP LF (SET/KITS/TRAYS/PACK) ×2 IMPLANT
SLING HALO OBTRYX (Sling) ×1 IMPLANT
STRIP CLOSURE SKIN 1/2X4 (GAUZE/BANDAGES/DRESSINGS) IMPLANT
SUT VIC AB 2-0 CT2 27 (SUTURE) ×2 IMPLANT
SUT VIC AB 2-0 UR6 27 (SUTURE) IMPLANT
SYRINGE 10CC LL (SYRINGE) ×2 IMPLANT
TOWEL OR 17X24 6PK STRL BLUE (TOWEL DISPOSABLE) ×4 IMPLANT
TRAY FOLEY CATH 14FR (SET/KITS/TRAYS/PACK) ×2 IMPLANT
WATER STERILE IRR 1000ML POUR (IV SOLUTION) ×2 IMPLANT

## 2011-05-08 NOTE — Anesthesia Procedure Notes (Signed)
Procedure Name: LMA Insertion Performed by: Suella Grove

## 2011-05-08 NOTE — Op Note (Signed)
Regina Kim, Regina Kim            ACCOUNT NO.:  [id number]  MEDICAL RECORD NO.:  [id number]  LOCATION:  [facility]                          FACILITY:  [facility]  PHYSICIAN:  Guy Sandifer. Henderson Cloud, M.D. DATE OF BIRTH:  [birth year]  DATE OF PROCEDURE:  05/08/2011 DATE OF DISCHARGE:                              OPERATIVE REPORT   PREOPERATIVE DIAGNOSIS:  Genuine stress urinary incontinence.  POSTOPERATIVE DIAGNOSIS:  Genuine stress urinary incontinence.  PROCEDURE:  Transobturator mid urethral sling and cystoscopy.  SURGEON:  Guy Sandifer. Henderson Cloud, M.D.  ANESTHESIA:  General with LMA.  ESTIMATED BLOOD LOSS:  Less than 100 cc.  SPECIMENS:  None.  INDICATIONS AND CONSENT:  This patient is a 60 year old, status post hysterectomy and AMP repair many years ago who has genuine stress urinary incontinence as demonstrated by urodynamic studies.  Options of management been discussed preoperatively.  Mid urethral sling has been discussed preoperatively.  Potential risks and complications have been reviewed preoperatively including, but not limited to success and failure rates, prolonged catheterization, self-catheterization, return to the operating room, recurrent stress incontinence, painful intercourse, pelvic pain, extrusion, possible increase and postoperative irritative voiding symptoms.  Also, potential infection, organ damage, bleeding requiring transfusion of blood products with HIV and hepatitis acquisition, DVT, PE, pneumonia been reviewed.  All questions were answered and consent signed on the chart.  PROCEDURE IN DETAIL:  The patient was taken to the operating room where she is identified, placed in dorsal supine position and general anesthesia was induced via LMA.  She was then placed in dorsal lithotomy position.  Time-out undertaken.  She is prepped and draped in sterile fashion.  Points of incision for the obturator passage of needles is carefully palpated bilaterally.  Marked with a pen.   It is then injected bilaterally with a dilute solution of 0.5% lidocaine and 1:200,000 epinephrine, which was diluted 20 cc of 100 cc of normal saline.  Stab incisions are made bilaterally.  Foley catheter was placed in the bladder.  Bladder was drained.  Catheter was left in place.  The intraurethral area was injected with same dilute solution.  A small inferior urethral incision is made longitudinally.  Dissection is carried out bilaterally with the Satinsky scissors toward the urogenital diaphragm.  The halo needles were then used to pass bilaterally from outside to in through the obturator foramen exiting below the urethra. Passage of the needle tip was guided with the examining finger.  At that point, a Foley catheter was removed and cystoscopy is carried out with 70 degrees cystoscope.  A 360 degree inspection with good distention of bladder reveals no evidence of foreign body.  No evidence of perforation, and a good puff of urine from the ureters bilaterally. Cystoscope was removed.  Foley catheter was placed.  The bladder was drained.  The polypropylene sling was then placed on the needle tips, which were withdrawn back through the incisions bilaterally.  Sheath was removed.  Careful inspection reveals the sling in midline flap with no rolls or kinks.  There is no evidence of buttonholing in the vaginal mucosa.  A 2-3 mm of space is noted between the urethra and the sling and a Kelly clamp placed below the sling, can  be rotated perpendicular to the floor with no tension on the sling as well.  Excess sling material at the level of the skin incision was trimmed bilaterally.  The vaginal mucosa was closed in running locking fashion with a 2-0 Vicryl suture.  At the end of the case, there was a small amount of blood in the catheter.  This was then back flushed and irrigated and careful observation revealed urine to become clear with the indigo carmine, which had been given  previously.  All counts were correct and the patient is awakened and taken to recovery room in stable condition.     Guy Sandifer Henderson Cloud, M.D.     JET/MEDQ  D:  05/08/2011  T:  05/08/2011  Job:  [id number]

## 2011-05-08 NOTE — Progress Notes (Signed)
Called to see pt in PACU.   200cc NS instilled into bladder-she voided about 75cc. After that,  Tolerating liquids well, no fever, no flank pain. Voided about 50cc one more time.  U/S of bladder c/w about 275cc. Pt c/o rt vulvar discomfort. On exam, VSS afeb. Rt vulva edematous. A/P-ureters had good puff of urine bilat at surgery. Edema started after foley was removed for voiding trial. Probable small defect in rt bladder wall. D/W pt and husband.  Will leave foley in place.  Macrobid bid while catheter in place. Careful instructions given to report any flank pain, N/V, fever, or steadily increasing swelling. Follow up in office next week.

## 2011-05-08 NOTE — Brief Op Note (Signed)
05/08/2011  8:15 AM  PATIENT:  Regina Kim  60 y.o. female with symptomatic stress urinary incontinence  PRE-OPERATIVE DIAGNOSIS:  Stress Urinary Incontinence  POST-OPERATIVE DIAGNOSIS:  Same  PROCEDURE:  Procedure(s): Transobturator mid-urethral sling  SURGEON:  Surgeon(s): Roselle Locus II  PHYSICIAN ASSISTANT:   ASSISTANTS: none   ANESTHESIA:   general  ESTIMATED BLOOD LOSS: <100cc  BLOOD ADMINISTERED:none  DRAINS: Urinary Catheter (Foley)   LOCAL MEDICATIONS USED:  XYLOCAINE approx 20 CC of .5%lidocaine/1:200,000 epi diluted 20cc in 100cc of NS  SPECIMEN:  No Specimen  DISPOSITION OF SPECIMEN:  N/A  COUNTS:  YES  TOURNIQUET:  * No tourniquets in log *  DICTATION #:  [id number]  PLAN OF CARE: D/C home today  PATIENT DISPOSITION:  PACU - hemodynamically stable.   Delay start of Pharmacological VTE agent (>24hrs) due to surgical blood loss or risk of bleeding:  not applicable

## 2011-05-08 NOTE — Transfer of Care (Signed)
Immediate Anesthesia Transfer of Care Note  Patient: Regina Kim  Procedure(s) Performed:  TRANSVAGINAL TAPE (TVT) PROCEDURE - Transobturator Tape With Cystoscopy  Patient Location: PACU  Anesthesia Type: General  Level of Consciousness: awake, oriented and patient cooperative  Airway & Oxygen Therapy: Patient Spontanous Breathing and Patient connected to nasal cannula oxygen  Post-op Assessment: Report given to PACU RN, Post -op Vital signs reviewed and stable and Patient moving all extremities  Post vital signs: Reviewed and stable  Complications: No apparent anesthesia complications

## 2011-05-08 NOTE — Progress Notes (Signed)
  No changes to H&P per pt history. Allergies-Vicodin-N/V

## 2011-05-08 NOTE — OR Nursing (Signed)
200cc normal saline instilled into bladder via cathetor , foley cathetor dc without difficulty, pt assisted to bathroom .10

## 2011-05-08 NOTE — Progress Notes (Signed)
1300 call to dr tomblin to report pt front bottom area to the right swollen and sore. Pt had only voided 75cc urine states she doesn't feel full no urge to void. Dr Henderson Cloud instructs to put in cathetor and attatch leg bag for pt to go home with. He states he will be in to assess the patient.  Foley cath inserted 16 french expelled 150cc bluish urine . Dr Henderson Cloud comes in to assess patient at approx 1330-1345. States he will be back to assess pt. Pt stable states sore but tolerable.  approx 1410 dr Henderson Cloud comes back to reassess pt. He says to send pt home with leg bag and instructs pt she will keep leg bag approx 2 weeks.  Pt sent home with leg bag verbalizes instructions. Husband also instructed. Bottom still swollen ice pack peri pad applied.

## 2011-05-08 NOTE — OR Nursing (Signed)
Hematuria draining foley post op in OR suite. Dr. Henderson Cloud called back to room. Foley irrigated nacl per Dr. Henderson Cloud. Foley draining indigo colored urine on exit from OR suite.

## 2011-05-08 NOTE — Anesthesia Postprocedure Evaluation (Signed)
  Anesthesia Post-op Note  Patient: Regina Kim  Procedure(s) Performed:  TRANSVAGINAL TAPE (TVT) PROCEDURE - Transobturator Tape With Cystoscopy  Patient's cardiopulmonary status is stable Patient's level of consciousness: sedate but responsive verbally Pain and nausea are all reasonably controlled No anesthetic complications apparent at this time No follow up care necessary at this time

## 2011-05-08 NOTE — H&P (Signed)
NAMEARIAN, MCQUITTY            ACCOUNT NO.:  [id number]  MEDICAL RECORD NO.:  [id number]  LOCATION:                                 FACILITY:  PHYSICIAN:  Guy Sandifer. Henderson Cloud, M.D. DATE OF BIRTH:  1951-07-26  DATE OF ADMISSION:  05/08/2011 DATE OF DISCHARGE:                             HISTORY & PHYSICAL   CHIEF COMPLAINT:  Leaking urine.  HISTORY OF PRESENT ILLNESS:  This patient is a 60 year old married white female G5, P5 status post total vaginal hysterectomy with A and P repair in 1983.  This helped with the urine leakage for about 5 years.  In spite of losing about 40 pounds, she has had progressively more symptomatic leaking of urine with coughing, sneezing, and any activities.  However, she is limited in her activities by constant leaking.  She also remains chronically irritated.  Urodynamics were consistent with genuine stress urinary continence.  Alternative therapies have been reviewed.  After careful discussion of options, the patient is admitted for a transobturator mid urethral sling.  Potential risks, complications, success and failure rates have been reviewed.  PAST MEDICAL HISTORY: 1. Abnormal Pap smear. 2. Heart disease. 3. Asthma. 4. Irritable bowel syndrome. 5. History of UTIs. 6. Diverticulosis. 7. Arthritis. 8. Diabetes. 9. Chronic hypertension.  PAST SURGICAL HISTORY: 1. Total vaginal hysterectomy with A and P repair in 1984. 2. Back surgery twice in 2010.  OBSTETRICAL HISTORY:  Vaginal delivery x5.  FAMILY HISTORY:  Positive for heart disease, asthma, gallbladder disease, osteoporosis, and cancer.  MEDICATIONS: 1. Metformin 850 mg twice a day. 2. Lisinopril 10 mg once a day. 3. Crestor 10 mg once a day. 4. Liraglutide 50 mg once a day. 5. Ibuprofen 800 mg twice a day (this was stopped 1-2 weeks     preoperatively).  SOCIAL HISTORY:  Has 1-2 drinks of alcohol per month.  Denies drug and tobacco abuse.  REVIEW OF SYSTEMS:  NEURO:   Denies headache.  CARDIAC:  Denies chest pain.  PULMONARY:  Denies shortness of breath.  PHYSICAL EXAMINATION:  VITAL SIGNS:  Height 5 feet 7 inches, weight 180 pounds, and blood pressure 128/84. LUNGS:  Clear to auscultation. HEART:  Regular rate and rhythm. ABDOMEN:  Soft, nontender without masses. PELVIC:  Vagina without lesion.  First-degree cystocele with some loss of level I support and small enterocele is noted.  Lax rectovaginal septum.  Good rectal sphincter tone. EXTREMITIES:  Grossly within normal limits. NEUROLOGIC:  Grossly within normal limits.  ASSESSMENT:  Genuine stress urinary continence.  PLAN:  Transobturator mid urethral sling.     Guy Sandifer Henderson Cloud, M.D.     JET/MEDQ  D:  05/07/2011  T:  05/07/2011  Job:  [id number]

## 2011-05-10 ENCOUNTER — Encounter (HOSPITAL_COMMUNITY): Payer: Self-pay | Admitting: Obstetrics and Gynecology

## 2011-05-10 ENCOUNTER — Inpatient Hospital Stay (HOSPITAL_COMMUNITY)
Admission: AD | Admit: 2011-05-10 | Discharge: 2011-05-10 | Disposition: A | Discharge status: Home / Self Care | Payer: 59 | Source: Ambulatory Visit | Attending: Obstetrics and Gynecology | Admitting: Obstetrics and Gynecology

## 2011-05-10 DIAGNOSIS — J45909 Unspecified asthma, uncomplicated: Secondary | ICD-10-CM | POA: Insufficient documentation

## 2011-05-10 DIAGNOSIS — Z79899 Other long term (current) drug therapy: Secondary | ICD-10-CM | POA: Insufficient documentation

## 2011-05-10 DIAGNOSIS — Z87891 Personal history of nicotine dependence: Secondary | ICD-10-CM | POA: Insufficient documentation

## 2011-05-10 DIAGNOSIS — K219 Gastro-esophageal reflux disease without esophagitis: Secondary | ICD-10-CM | POA: Insufficient documentation

## 2011-05-10 DIAGNOSIS — T8389XA Other specified complication of genitourinary prosthetic devices, implants and grafts, initial encounter: Secondary | ICD-10-CM | POA: Insufficient documentation

## 2011-05-10 DIAGNOSIS — R338 Other retention of urine: Secondary | ICD-10-CM | POA: Insufficient documentation

## 2011-05-10 DIAGNOSIS — E119 Type 2 diabetes mellitus without complications: Secondary | ICD-10-CM | POA: Insufficient documentation

## 2011-05-10 DIAGNOSIS — Y849 Medical procedure, unspecified as the cause of abnormal reaction of the patient, or of later complication, without mention of misadventure at the time of the procedure: Secondary | ICD-10-CM | POA: Insufficient documentation

## 2011-05-10 NOTE — ED Provider Notes (Addendum)
History     Chief Complaint  Patient presents with  . Post-op Problem   HPI 60 year old female.  Surgery on 05-08-11 by Dr. Henderson Cloud for transobturator mid urethral sling.  Unable to void and foley placed before leaving hospital.  Expects to keep foley x 2 weeks.  Was draining well until tonight.  Not draining and client states bladder is very full and painful.   Past Medical History  Diagnosis Date  . 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 Myocardial infarction 2005    no blockage found in heart cath  . Angina     history of angina- none in 5 yrs  . Asthma     years ago & related to GERD  . Diabetes mellitus     dx'd 4 yrs ago-NIDDM  . GERD (gastroesophageal reflux disease)   . Arthritis   . SUI (stress urinary incontinence, female) 05/08/2011    Past Surgical History  Procedure Date  . Cardiac catheterization   . Cystoscopy   . Abdominal hysterectomy   . Back surgery   . Bladder surgery 30 yrs ago    Bladder Tack  . Bladder surgery 05/08/11    Bladder Sling    No family history on file.  History  Substance Use Topics  . Smoking status: Former Games developer  . Smokeless tobacco: Not on file  . Alcohol Use: 0.6 oz/week    1 Glasses of wine per week    Allergies:  Allergies  Allergen Reactions  . Vicodin (Hydrocodone-Acetaminophen) Nausea Only  . Nickel Rash    Prescriptions prior to admission  Medication Sig Dispense Refill  . ibuprofen (ADVIL,MOTRIN) 800 MG tablet Take 800 mg by mouth 2 (two) times daily.        Marland Kitchen LIRAGLUTIDE [place] Inject 10 Units into the skin. Patient uses 10 clicks. Unsure of dosage strength and was unable to verify prescription with pharmacy      . lisinopril (PRINIVIL,ZESTRIL) 10 MG tablet Take 10 mg by mouth daily.        . metFORMIN (GLUCOPHAGE) 850 MG tablet Take 850 mg by mouth 2 (two) times daily with a meal.        . oxyCODONE-acetaminophen (PERCOCET) 5-325 MG per tablet Take 1 tablet by  mouth every 6 (six) hours as needed. For pain       . rosuvastatin (CRESTOR) 10 MG tablet Take 10 mg by mouth daily.          ROS Physical Exam   Blood pressure 121/77, pulse 74, temperature 98.1 F (36.7 C), temperature source Oral, height 5\' 5"  (1.651 m), weight 188 lb 2 oz (85.333 kg).  Physical Exam  Nursing note and vitals reviewed. Constitutional: She is oriented to person, place, and time. She appears well-developed and well-nourished.  HENT:  Head: Normocephalic.  Eyes: EOM are normal.  Neck: Neck supple.  GI: Soft.       Suprapubic tenderness noted.  Genitourinary:       Foley catheter in place.  Leg bag has small amount of dark urine   Musculoskeletal: Normal range of motion.  Neurological: She is alert and oriented to person, place, and time.  Skin: Skin is warm and dry.  Psychiatric: She has a normal mood and affect.    MAU Course  Procedures Consult with Dr. Marcelle Overlie to review plan of care. Bladder scan done 829 cc Will replace Foley Catheter.  Foley replaced and 900 cc returned before client went home.  Mucus clog seen in foley which was removed. Client feeling much better now that foley is draining well    Assessment and Plan  Nondraining foley catheter  Plan: Foley catheter replaced Client to follow up in the office as planned or call the office on Monday to be seen if continues to have problems. Encouraged plenty of PO fluids.  Romanita Fager 05/10/2011, 1:55 AM   Nolene Bernheim, NP 05/21/11 [phone]

## 2011-05-10 NOTE — Progress Notes (Signed)
Pt states, " I had a bladder sling yesterday morning. I had to have a second catheter inserted, now that catheter is not draining and have not had any urine for 3-4 hrs. I feel like I need to go."

## 2011-05-28 ENCOUNTER — Encounter (HOSPITAL_COMMUNITY): Payer: Self-pay | Admitting: Obstetrics and Gynecology

## 2012-03-12 ENCOUNTER — Other Ambulatory Visit: Payer: Self-pay | Admitting: Orthopedic Surgery

## 2012-03-23 ENCOUNTER — Encounter (HOSPITAL_COMMUNITY): Payer: Self-pay | Admitting: *Deleted

## 2012-03-23 ENCOUNTER — Encounter (HOSPITAL_COMMUNITY): Payer: Self-pay | Admitting: Pharmacy Technician

## 2012-03-23 NOTE — Progress Notes (Signed)
PRINZMETAL SYNDROME

## 2012-03-23 NOTE — Pre-Procedure Instructions (Signed)
21 Regina Kim  03/23/2012   Your procedure is scheduled on:  03/31/12  Report to [facility] at 1100 AM.  Call this number if you have problems the morning of surgery: [phone]   Remember:   Do not eat food:After Midnight.  May have clear liquids: up to 4 Hours before arrival 700 am.  Clear liquids include soda, tea, black coffee, apple or grape juice, broth.  Take these medicines the morning of surgery with A SIP OF WATER:none STOP ibuprofen    Do not wear jewelry, make-up or nail polish.  Do not wear lotions, powders, or perfumes. You may wear deodorant.  Do not shave 48 hours prior to surgery. Men may shave face and neck.  Do not bring valuables to the hospital.  Contacts, dentures or bridgework may not be worn into surgery.  Leave suitcase in the car. After surgery it may be brought to your room.  For patients admitted to the hospital, checkout time is 11:00 AM the day of discharge.   Patients discharged the day of surgery will not be allowed to drive home.  Name and phone number of your driver: Maurine Minister [phone]  Special Instructions: Incentive Spirometry - Practice and bring it with you on the day of surgery.SHOWER WITH CHG NITE BEFORE AND AM OF SURGERY   Please read over the following fact sheets that you were given: Pain Booklet, Coughing and Deep Breathing, Blood Transfusion Information, Open Heart Packet, MRSA Information and Surgical Site Infection Prevention

## 2012-03-24 ENCOUNTER — Encounter (HOSPITAL_COMMUNITY)
Admission: RE | Admit: 2012-03-24 | Discharge: 2012-03-24 | Disposition: A | Discharge status: Home / Self Care | Payer: 59 | Source: Ambulatory Visit | Attending: Orthopedic Surgery | Admitting: Orthopedic Surgery

## 2012-03-24 LAB — TYPE AND SCREEN
ABO/RH(D): A POS
Antibody Screen: NEGATIVE

## 2012-03-24 LAB — SURGICAL PCR SCREEN: Staphylococcus aureus: NEGATIVE

## 2012-03-24 LAB — DIFFERENTIAL
Basophils Absolute: 0 10*3/uL (ref 0.0–0.1)
Lymphocytes Relative: 24 % (ref 12–46)
Lymphs Abs: 1.3 10*3/uL (ref 0.7–4.0)
Neutro Abs: 3.4 10*3/uL (ref 1.7–7.7)

## 2012-03-24 LAB — URINALYSIS, ROUTINE W REFLEX MICROSCOPIC
Bilirubin Urine: NEGATIVE
Hgb urine dipstick: NEGATIVE
Ketones, ur: NEGATIVE mg/dL
Protein, ur: NEGATIVE mg/dL
Urobilinogen, UA: 0.2 mg/dL (ref 0.0–1.0)

## 2012-03-24 LAB — BASIC METABOLIC PANEL
Calcium: 10.2 mg/dL (ref 8.4–10.5)
Chloride: 103 mEq/L (ref 96–112)
Creatinine, Ser: 0.91 mg/dL (ref 0.50–1.10)
GFR calc Af Amer: 78 mL/min — ABNORMAL LOW (ref >90)
Sodium: 139 mEq/L (ref 135–145)

## 2012-03-24 LAB — APTT: aPTT: 26 seconds (ref 24–37)

## 2012-03-24 LAB — CBC
HCT: 39 % (ref 36.0–46.0)
Platelets: 229 10*3/uL (ref 150–400)
RBC: 4.45 MIL/uL (ref 3.87–5.11)
RDW: 13.2 % (ref 11.5–15.5)
WBC: 5.4 10*3/uL (ref 4.0–10.5)

## 2012-03-24 LAB — PROTIME-INR: Prothrombin Time: 11.9 seconds (ref 11.6–15.2)

## 2012-03-24 LAB — URINE MICROSCOPIC-ADD ON

## 2012-03-24 NOTE — Pre-Procedure Instructions (Signed)
20 Regina Kim   03/24/2012   Your procedure is scheduled on:  June 12, Wednesday  Report to [facility] at  11:00 AM.  Call this number if you have problems the morning of surgery: [phone]   Remember:   Do not eat food:After Midnight TUESDAY.  May have clear liquids: up to 4 Hours before arrival TIME---7:00 AM.  Clear liquids include soda, tea, black coffee, apple or grape juice, broth.   Take these medicines the morning of surgery with A SIP OF WATER:  NOTHING   Do not wear jewelry, make-up or nail polish.  Do not wear lotions, powders, or perfumes. You may wear deodorant.  Do not shave 48 hours prior to surgery. Men may shave face and neck.   Do not bring valuables to the hospital.   Contacts, dentures or bridgework may not be worn into surgery.  Leave suitcase in the car. After surgery it may be brought to your room.  For patients admitted to the hospital, checkout time is 11:00 AM the day of discharge.   Patients discharged the day of surgery will not be allowed to drive home.  Name and phone number of your driver:   Special Instructions: CHG Shower Use Special Wash: 1/2 bottle night before surgery and 1/2 bottle morning of surgery.   Please read over the following fact sheets that you were given: Pain Booklet, Coughing and Deep Breathing, MRSA Information and Surgical Site Infection Prevention

## 2012-03-30 MED ORDER — CHLORHEXIDINE GLUCONATE 4 % EX LIQD: 60.0000 mL | Freq: Once | CUTANEOUS | Status: DC

## 2012-03-30 MED ORDER — CEFAZOLIN SODIUM-DEXTROSE 2-3 GM-% IV SOLR
2.0000 g | INTRAVENOUS | Status: AC
  Administered 2012-03-31: 2 g via INTRAVENOUS

## 2012-03-30 MED ORDER — DEXTROSE-NACL 5-0.45 % IV SOLN: INTRAVENOUS | Status: DC

## 2012-03-30 NOTE — H&P (Signed)
Subjective: Patient returns today with increased pain in her right hip which is avascular necrosis with a positive crescent sign when we last saw her in August of 2012 and discussed her possible need for hip replacement.  The pain affects her walking with a limp, wakes her up at night and is worse when she goes from sitting to standing.  She is a non-insulin-dependent diabetic and reports that her blood sugars are stable.  PAST MEDICAL HISTORY:   She has had back pain in the past and has had a couple of back surgeries, appendectomy, hysterectomy and bladder surgery.  She is allergic to Vicodin.  She is a diabetic on oral medications.  Years ago when I gave her a cortisone shot in the shoulder her sugar went up to 585.  She will never had a cortisone shot again.  She takes Crestor for elevated cholesterol.    ROS: Wears glasses.  Has a history of asthma.  Currently is being treated for a UTI.  ROS: Patient denies dizziness, nausea, fever, chills, vomiting, shortness of breath, chest pain, loss of appetite, or rash.    Medications include metformin, ibuprofen, Crestor, and lisinopril.  FAMILY HISTORY:  Positive for diabetes, high blood pressure, heart disease, and arthritis.  SOCIAL HISTORY:  She does not smoke.  Has 1 ounce of alcohol a month.  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  PHYSICAL EXAM: Well-developed, well-nourished.  Awake, alert, and oriented x3.  Extraocular motion is intact.  No use of accessory respiratory muscles for breathing.   Cardiovascular exam reveals a regular rhythm.  Skin is intact without cuts, scrapes, or abrasions. Examination her right hip is irritable to internal rotation.  Foot tap is negative.  AP and lateral x-rays of both hips ordered and interpreted by me today show positive crescent sign on the right and 80 on the left.  It also involves more than 50% of the weightbearing dome of the femoral head.  She has normal sensation to her lower  extremities.  Normal pulses to her feet and mildly diminished sensation of the foot pads.  Asses: Symptomatic avascular necrosis of the right hip minimally symptomatic to the left hip with AVN involving more than 50% of the femoral heads.  Plan: We will get her set up for right total hip arthroplasty.  Models were brought into the room.  Her some benefits of surgery discussed.  She plans to go home after her surgery and, at age 61 should be a good candidate.

## 2012-03-31 ENCOUNTER — Encounter (HOSPITAL_COMMUNITY): Payer: Self-pay | Admitting: Anesthesiology

## 2012-03-31 ENCOUNTER — Encounter (HOSPITAL_COMMUNITY)
Admission: RE | Disposition: A | Discharge status: Home / Self Care | Payer: Self-pay | Source: Ambulatory Visit | Attending: Orthopedic Surgery

## 2012-03-31 ENCOUNTER — Inpatient Hospital Stay (HOSPITAL_COMMUNITY)
Admission: RE | Admit: 2012-03-31 | Discharge: 2012-04-02 | DRG: 470 | Disposition: A | Discharge status: Home / Self Care | Payer: 59 | Source: Ambulatory Visit | Attending: Orthopedic Surgery | Admitting: Orthopedic Surgery

## 2012-03-31 ENCOUNTER — Inpatient Hospital Stay (HOSPITAL_COMMUNITY): Payer: 59

## 2012-03-31 ENCOUNTER — Ambulatory Visit (HOSPITAL_COMMUNITY): Payer: 59 | Admitting: Anesthesiology

## 2012-03-31 DIAGNOSIS — E119 Type 2 diabetes mellitus without complications: Secondary | ICD-10-CM | POA: Diagnosis present

## 2012-03-31 DIAGNOSIS — K219 Gastro-esophageal reflux disease without esophagitis: Secondary | ICD-10-CM | POA: Diagnosis present

## 2012-03-31 DIAGNOSIS — I252 Old myocardial infarction: Secondary | ICD-10-CM

## 2012-03-31 DIAGNOSIS — E7800 Pure hypercholesterolemia, unspecified: Secondary | ICD-10-CM | POA: Diagnosis present

## 2012-03-31 DIAGNOSIS — J45909 Unspecified asthma, uncomplicated: Secondary | ICD-10-CM | POA: Diagnosis present

## 2012-03-31 DIAGNOSIS — M87059 Idiopathic aseptic necrosis of unspecified femur: Principal | ICD-10-CM | POA: Diagnosis present

## 2012-03-31 HISTORY — DX: Nausea with vomiting, unspecified: R11.2

## 2012-03-31 HISTORY — DX: Proteinuria, unspecified: R80.9

## 2012-03-31 HISTORY — PX: TOTAL HIP ARTHROPLASTY: SHX124

## 2012-03-31 HISTORY — DX: Other specified postprocedural states: Z98.890

## 2012-03-31 HISTORY — DX: Urinary tract infection, site not specified: N39.0

## 2012-03-31 LAB — GLUCOSE, CAPILLARY
Glucose-Capillary: 99 mg/dL (ref 70–99)
Glucose-Capillary: 178 mg/dL — ABNORMAL HIGH (ref 70–99)
Glucose-Capillary: 199 mg/dL — ABNORMAL HIGH (ref 70–99)

## 2012-03-31 SURGERY — ARTHROPLASTY, HIP, TOTAL,POSTERIOR APPROACH
Anesthesia: General | Site: Hip | Laterality: Right | Wound class: Clean

## 2012-03-31 MED ORDER — BISACODYL 5 MG PO TBEC: 5.0000 mg | DELAYED_RELEASE_TABLET | Freq: Every day | ORAL | Status: DC | PRN

## 2012-03-31 MED ORDER — 0.9 % SODIUM CHLORIDE (POUR BTL) OPTIME
TOPICAL | Status: DC | PRN
  Administered 2012-03-31: 1000 mL

## 2012-03-31 MED ORDER — LISINOPRIL 10 MG PO TABS
10.0000 mg | ORAL_TABLET | Freq: Every evening | ORAL | Status: DC
  Filled 2012-03-31 (×3): qty 1

## 2012-03-31 MED ORDER — ACETAMINOPHEN 325 MG PO TABS: 650.0000 mg | ORAL_TABLET | Freq: Four times a day (QID) | ORAL | Status: DC | PRN

## 2012-03-31 MED ORDER — INSULIN ASPART 100 UNIT/ML ~~LOC~~ SOLN
0.0000 [IU] | Freq: Three times a day (TID) | SUBCUTANEOUS | Status: DC
  Administered 2012-03-31 – 2012-04-01 (×2): 3 [IU] via SUBCUTANEOUS
  Administered 2012-04-01: 5 [IU] via SUBCUTANEOUS
  Administered 2012-04-01: 3 [IU] via SUBCUTANEOUS
  Administered 2012-04-02: 2 [IU] via SUBCUTANEOUS

## 2012-03-31 MED ORDER — FENTANYL CITRATE 0.05 MG/ML IJ SOLN
INTRAMUSCULAR | Status: DC | PRN
  Administered 2012-03-31 (×2): 50 ug via INTRAVENOUS
  Administered 2012-03-31: 150 ug via INTRAVENOUS

## 2012-03-31 MED ORDER — ASPIRIN EC 325 MG PO TBEC
325.0000 mg | DELAYED_RELEASE_TABLET | Freq: Two times a day (BID) | ORAL | Status: DC
  Administered 2012-03-31 – 2012-04-02 (×4): 325 mg via ORAL
  Filled 2012-03-31 (×5): qty 1

## 2012-03-31 MED ORDER — HYDROMORPHONE HCL PF 1 MG/ML IJ SOLN
INTRAMUSCULAR | Status: AC
  Filled 2012-03-31: qty 1

## 2012-03-31 MED ORDER — KCL IN DEXTROSE-NACL 20-5-0.45 MEQ/L-%-% IV SOLN
INTRAVENOUS | Status: DC
  Administered 2012-03-31: 125 mL/h via INTRAVENOUS
  Filled 2012-03-31 (×8): qty 1000

## 2012-03-31 MED ORDER — METHOCARBAMOL 500 MG PO TABS
500.0000 mg | ORAL_TABLET | Freq: Four times a day (QID) | ORAL | Status: DC | PRN
  Administered 2012-03-31 – 2012-04-01 (×4): 500 mg via ORAL
  Filled 2012-03-31 (×4): qty 1

## 2012-03-31 MED ORDER — HYDROMORPHONE HCL PF 1 MG/ML IJ SOLN
0.2500 mg | INTRAMUSCULAR | Status: DC | PRN
  Administered 2012-03-31 (×4): 0.5 mg via INTRAVENOUS

## 2012-03-31 MED ORDER — METFORMIN HCL 500 MG PO TABS
1000.0000 mg | ORAL_TABLET | Freq: Two times a day (BID) | ORAL | Status: DC
  Administered 2012-03-31 – 2012-04-02 (×5): 1000 mg via ORAL
  Filled 2012-03-31 (×6): qty 2

## 2012-03-31 MED ORDER — ALUM & MAG HYDROXIDE-SIMETH 200-200-20 MG/5ML PO SUSP
30.0000 mL | ORAL | Status: DC | PRN
  Administered 2012-04-01 (×2): 30 mL via ORAL
  Filled 2012-03-31: qty 30

## 2012-03-31 MED ORDER — MAGNESIUM HYDROXIDE 400 MG/5ML PO SUSP: 30.0000 mL | Freq: Every day | ORAL | Status: DC | PRN

## 2012-03-31 MED ORDER — VECURONIUM BROMIDE 10 MG IV SOLR
INTRAVENOUS | Status: DC | PRN
  Administered 2012-03-31: 1 mg via INTRAVENOUS

## 2012-03-31 MED ORDER — LIDOCAINE HCL (CARDIAC) 20 MG/ML IV SOLN
INTRAVENOUS | Status: DC | PRN
  Administered 2012-03-31: 50 mg via INTRAVENOUS

## 2012-03-31 MED ORDER — GLYCOPYRROLATE 0.2 MG/ML IJ SOLN
INTRAMUSCULAR | Status: DC | PRN
  Administered 2012-03-31: .8 mg via INTRAVENOUS

## 2012-03-31 MED ORDER — HYDROMORPHONE HCL PF 1 MG/ML IJ SOLN
INTRAMUSCULAR | Status: AC
  Administered 2012-03-31: 0.5 mg via INTRAVENOUS
  Filled 2012-03-31: qty 1

## 2012-03-31 MED ORDER — FLEET ENEMA 7-19 GM/118ML RE ENEM: 1.0000 | ENEMA | Freq: Once | RECTAL | Status: AC | PRN

## 2012-03-31 MED ORDER — LACTATED RINGERS IV SOLN
INTRAVENOUS | Status: DC
  Administered 2012-03-31: 12:00:00 via INTRAVENOUS

## 2012-03-31 MED ORDER — ONDANSETRON HCL 4 MG/2ML IJ SOLN: 4.0000 mg | Freq: Four times a day (QID) | INTRAMUSCULAR | Status: DC | PRN

## 2012-03-31 MED ORDER — MIDAZOLAM HCL 5 MG/5ML IJ SOLN
INTRAMUSCULAR | Status: DC | PRN
  Administered 2012-03-31: 2 mg via INTRAVENOUS

## 2012-03-31 MED ORDER — HETASTARCH-ELECTROLYTES 6 % IV SOLN
INTRAVENOUS | Status: DC | PRN
  Administered 2012-03-31: 14:00:00 via INTRAVENOUS

## 2012-03-31 MED ORDER — METOCLOPRAMIDE HCL 5 MG PO TABS
5.0000 mg | ORAL_TABLET | Freq: Three times a day (TID) | ORAL | Status: DC | PRN
  Filled 2012-03-31: qty 2

## 2012-03-31 MED ORDER — ZOLPIDEM TARTRATE 5 MG PO TABS: 5.0000 mg | ORAL_TABLET | Freq: Every evening | ORAL | Status: DC | PRN

## 2012-03-31 MED ORDER — LIRAGLUTIDE 18 MG/3ML ~~LOC~~ SOLN: 1.2000 mL | Freq: Every morning | SUBCUTANEOUS | Status: DC

## 2012-03-31 MED ORDER — PROPOFOL 10 MG/ML IV EMUL
INTRAVENOUS | Status: DC | PRN
  Administered 2012-03-31: 140 mg via INTRAVENOUS

## 2012-03-31 MED ORDER — BUPIVACAINE-EPINEPHRINE 0.5% -1:200000 IJ SOLN
INTRAMUSCULAR | Status: DC | PRN
  Administered 2012-03-31: 10 mL

## 2012-03-31 MED ORDER — HYDROMORPHONE HCL PF 1 MG/ML IJ SOLN
0.5000 mg | INTRAMUSCULAR | Status: DC | PRN
  Administered 2012-03-31 – 2012-04-01 (×3): 1 mg via INTRAVENOUS
  Filled 2012-03-31 (×3): qty 1

## 2012-03-31 MED ORDER — ATORVASTATIN CALCIUM 20 MG PO TABS
20.0000 mg | ORAL_TABLET | Freq: Every day | ORAL | Status: DC
  Administered 2012-03-31 – 2012-04-02 (×3): 20 mg via ORAL
  Filled 2012-03-31 (×3): qty 1

## 2012-03-31 MED ORDER — MENTHOL 3 MG MT LOZG: 1.0000 | LOZENGE | OROMUCOSAL | Status: DC | PRN

## 2012-03-31 MED ORDER — ACETAMINOPHEN 650 MG RE SUPP: 650.0000 mg | Freq: Four times a day (QID) | RECTAL | Status: DC | PRN

## 2012-03-31 MED ORDER — PHENOL 1.4 % MT LIQD: 1.0000 | OROMUCOSAL | Status: DC | PRN

## 2012-03-31 MED ORDER — LACTATED RINGERS IV SOLN
INTRAVENOUS | Status: DC | PRN
  Administered 2012-03-31 (×2): via INTRAVENOUS

## 2012-03-31 MED ORDER — OXYCODONE-ACETAMINOPHEN 5-325 MG PO TABS
1.0000 | ORAL_TABLET | ORAL | Status: DC | PRN
  Administered 2012-03-31: 2 via ORAL
  Filled 2012-03-31: qty 2

## 2012-03-31 MED ORDER — NEOSTIGMINE METHYLSULFATE 1 MG/ML IJ SOLN
INTRAMUSCULAR | Status: DC | PRN
  Administered 2012-03-31: 4 mg via INTRAVENOUS

## 2012-03-31 MED ORDER — ONDANSETRON HCL 4 MG/2ML IJ SOLN
INTRAMUSCULAR | Status: DC | PRN
  Administered 2012-03-31: 4 mg via INTRAVENOUS

## 2012-03-31 MED ORDER — METHOCARBAMOL 100 MG/ML IJ SOLN
500.0000 mg | Freq: Four times a day (QID) | INTRAVENOUS | Status: DC | PRN
  Filled 2012-03-31: qty 5

## 2012-03-31 MED ORDER — ONDANSETRON HCL 4 MG PO TABS: 4.0000 mg | ORAL_TABLET | Freq: Four times a day (QID) | ORAL | Status: DC | PRN

## 2012-03-31 MED ORDER — DIPHENHYDRAMINE HCL 12.5 MG/5ML PO ELIX: 12.5000 mg | ORAL_SOLUTION | ORAL | Status: DC | PRN

## 2012-03-31 MED ORDER — OXYCODONE HCL 5 MG PO TABS
5.0000 mg | ORAL_TABLET | ORAL | Status: DC | PRN
  Administered 2012-03-31 – 2012-04-01 (×2): 10 mg via ORAL
  Filled 2012-03-31 (×2): qty 2

## 2012-03-31 MED ORDER — ROCURONIUM BROMIDE 100 MG/10ML IV SOLN
INTRAVENOUS | Status: DC | PRN
  Administered 2012-03-31: 50 mg via INTRAVENOUS

## 2012-03-31 MED ORDER — METOCLOPRAMIDE HCL 5 MG/ML IJ SOLN
5.0000 mg | Freq: Three times a day (TID) | INTRAMUSCULAR | Status: DC | PRN
  Filled 2012-03-31: qty 2

## 2012-03-31 SURGICAL SUPPLY — 54 items
BLADE SAW SAG 73X25 THK (BLADE) ×1
BLADE SAW SGTL 18X1.27X75 (BLADE) IMPLANT
BLADE SAW SGTL 73X25 THK (BLADE) ×1 IMPLANT
BLADE SAW SGTL MED 73X18.5 STR (BLADE) IMPLANT
BRUSH FEMORAL CANAL (MISCELLANEOUS) IMPLANT
CLOTH BEACON ORANGE TIMEOUT ST (SAFETY) ×2 IMPLANT
COVER BACK TABLE 24X17X13 BIG (DRAPES) ×1 IMPLANT
COVER SURGICAL LIGHT HANDLE (MISCELLANEOUS) ×3 IMPLANT
DRAPE ORTHO SPLIT 77X108 STRL (DRAPES) ×2
DRAPE PROXIMA HALF (DRAPES) ×2 IMPLANT
DRAPE SURG ORHT 6 SPLT 77X108 (DRAPES) ×1 IMPLANT
DRAPE U-SHAPE 47X51 STRL (DRAPES) ×2 IMPLANT
DRILL BIT 7/64X5 (BIT) ×2 IMPLANT
DRSG MEPILEX BORDER 4X12 (GAUZE/BANDAGES/DRESSINGS) ×1 IMPLANT
DRSG MEPILEX BORDER 4X8 (GAUZE/BANDAGES/DRESSINGS) ×2 IMPLANT
DURAPREP 26ML APPLICATOR (WOUND CARE) ×2 IMPLANT
ELECT BLADE 4.0 EZ CLEAN MEGAD (MISCELLANEOUS) ×2
ELECT REM PT RETURN 9FT ADLT (ELECTROSURGICAL) ×2
ELECTRODE BLDE 4.0 EZ CLN MEGD (MISCELLANEOUS) IMPLANT
ELECTRODE REM PT RTRN 9FT ADLT (ELECTROSURGICAL) ×1 IMPLANT
GAUZE XEROFORM 1X8 LF (GAUZE/BANDAGES/DRESSINGS) ×1 IMPLANT
GLOVE BIO SURGEON STRL SZ7 (GLOVE) ×2 IMPLANT
GLOVE BIO SURGEON STRL SZ7.5 (GLOVE) ×2 IMPLANT
GLOVE BIOGEL PI IND STRL 7.0 (GLOVE) ×1 IMPLANT
GLOVE BIOGEL PI IND STRL 8 (GLOVE) ×1 IMPLANT
GLOVE BIOGEL PI INDICATOR 7.0 (GLOVE) ×3
GLOVE BIOGEL PI INDICATOR 8 (GLOVE) ×1
GLOVE SURG SS PI 6.5 STRL IVOR (GLOVE) ×1 IMPLANT
GOWN PREVENTION PLUS XLARGE (GOWN DISPOSABLE) ×2 IMPLANT
GOWN STRL NON-REIN LRG LVL3 (GOWN DISPOSABLE) ×5 IMPLANT
HANDPIECE INTERPULSE COAX TIP (DISPOSABLE)
HOOD PEEL AWAY FACE SHEILD DIS (HOOD) ×4 IMPLANT
KIT BASIN OR (CUSTOM PROCEDURE TRAY) ×2 IMPLANT
KIT ROOM TURNOVER OR (KITS) ×2 IMPLANT
MANIFOLD NEPTUNE II (INSTRUMENTS) ×2 IMPLANT
NEEDLE 22X1 1/2 (OR ONLY) (NEEDLE) ×2 IMPLANT
NS IRRIG 1000ML POUR BTL (IV SOLUTION) ×2 IMPLANT
PACK TOTAL JOINT (CUSTOM PROCEDURE TRAY) ×2 IMPLANT
PAD ARMBOARD 7.5X6 YLW CONV (MISCELLANEOUS) ×4 IMPLANT
PASSER SUT SWANSON 36MM LOOP (INSTRUMENTS) ×2 IMPLANT
PRESSURIZER FEMORAL UNIV (MISCELLANEOUS) IMPLANT
SET HNDPC FAN SPRY TIP SCT (DISPOSABLE) IMPLANT
SUT ETHIBOND 2 V 37 (SUTURE) ×2 IMPLANT
SUT ETHILON 3 0 FSL (SUTURE) ×2 IMPLANT
SUT VIC AB 0 CTB1 27 (SUTURE) ×2 IMPLANT
SUT VIC AB 1 CTX 36 (SUTURE) ×2
SUT VIC AB 1 CTX36XBRD ANBCTR (SUTURE) ×1 IMPLANT
SUT VIC AB 2-0 CTB1 (SUTURE) ×2 IMPLANT
SYR CONTROL 10ML LL (SYRINGE) ×2 IMPLANT
TOWEL OR 17X24 6PK STRL BLUE (TOWEL DISPOSABLE) ×2 IMPLANT
TOWEL OR 17X26 10 PK STRL BLUE (TOWEL DISPOSABLE) ×2 IMPLANT
TOWER CARTRIDGE SMART MIX (DISPOSABLE) IMPLANT
TRAY FOLEY CATH 14FR (SET/KITS/TRAYS/PACK) ×1 IMPLANT
WATER STERILE IRR 1000ML POUR (IV SOLUTION) ×5 IMPLANT

## 2012-03-31 NOTE — Anesthesia Procedure Notes (Signed)
Procedure Name: Intubation Date/Time: 03/31/2012 1:04 PM Performed by: Garen Lah Pre-anesthesia Checklist: Patient identified, Timeout performed, Emergency Drugs available, Suction available and Patient being monitored Patient Re-evaluated:Patient Re-evaluated prior to inductionOxygen Delivery Method: Circle system utilized Preoxygenation: Pre-oxygenation with 100% oxygen Intubation Type: IV induction Ventilation: Mask ventilation without difficulty Laryngoscope Size: Mac and 3 Grade View: Grade I Tube type: Oral Tube size: 7.5 mm Number of attempts: 1 Airway Equipment and Method: Stylet Placement Confirmation: ETT inserted through vocal cords under direct vision,  positive ETCO2 and breath sounds checked- equal and bilateral Secured at: 21 cm Tube secured with: Tape Dental Injury: Teeth and Oropharynx as per pre-operative assessment

## 2012-03-31 NOTE — Interval H&P Note (Signed)
History and Physical Interval Note:  03/31/2012 1:20 PM  Regina Kim  has presented today for surgery, with the diagnosis of AVASCULAR NECROSIS RIGHT HIP  The various methods of treatment have been discussed with the patient and family. After consideration of risks, benefits and other options for treatment, the patient has consented to  Procedure(s) (LRB): TOTAL HIP ARTHROPLASTY (Right) as a surgical intervention .  The patients' history has been reviewed, patient examined, no change in status, stable for surgery.  I have reviewed the patients' chart and labs.  Questions were answered to the patient's satisfaction.     Nestor Lewandowsky

## 2012-03-31 NOTE — Preoperative (Signed)
Beta Blockers   Reason not to administer Beta Blockers:Not Applicable. No home beta blockers 

## 2012-03-31 NOTE — Op Note (Signed)
OPERATIVE REPORT    DATE OF PROCEDURE:  03/31/2012       PREOPERATIVE DIAGNOSIS:  AVASCULAR NECROSIS RIGHT HIP                                                          POSTOPERATIVE DIAGNOSIS:  AVASCULAR NECROSIS RIGHT HIP                                                           PROCEDURE:  R total hip arthroplasty using a 52 mm DePuy Pinnacle  Cup, [employer], 10-degree polyethylene liner index superior  and posterior, a +0 36 mm ceramic head, a [phone] SROM stem, 18Dsm Cone   SURGEON: Desire Fulp J    ASSISTANT:   Mauricia Area, PA-C  (present throughout entire procedure and necessary for timely completion of the procedure)   ANESTHESIA: General BLOOD LOSS: 300 FLUID REPLACEMENT: 1500 crystalloid DRAINS: Foley Catheter URINE OUTPUT: 300cc COMPLICATIONS: none    INDICATIONS FOR PROCEDURE: A 61 y.o. year-old With  AVASCULAR NECROSIS RIGHT HIP   for 2 years, x-rays show bone-on-bone arthritic changes. Despite conservative measures with observation, anti-inflammatory medicine, narcotics, use of a cane, has severe unremitting pain and can ambulate only a few blocks before resting.  Patient desires elective R total hip arthroplasty to decrease pain and increase function. The risks, benefits, and alternatives were discussed at length including but not limited to the risks of infection, bleeding, nerve injury, stiffness, blood clots, the need for revision surgery, cardiopulmonary complications, among others, and they were willing to proceed.y have been discussed. Questions answered.     PROCEDURE IN DETAIL: The patient was identified by armband,  received preoperative IV antibiotics in the holding area at [facility], taken to the operating room , appropriate anesthetic monitors  were attached and general endotracheal anesthesia induced. Foley catheter was inserted. She was rolled into the L lateral decubitus position and fixed there with a Stulberg Mark II pelvic  clamp and the R lower extremity was then prepped and draped  in the usual sterile fashion from the ankle to the hemipelvis. A time-out  procedure was performed. The skin along the lateral hip and thigh  infiltrated with 10 mL of 0.5% Marcaine and epinephrine solution. We  then made a posterolateral approach to the hip. With a #10 blade, 12 cm  incision through skin and subcutaneous tissue down to the level of the  IT band. Small bleeders were identified and cauterized. IT band cut in  line with skin incision exposing the greater trochanter. A Cobra retractor was placed between the gluteus minimus and the superior hip joint capsule, and a spiked Cobra between the quadratus femoris and the inferior hip joint capsule. This isolated the short  external rotators and piriformis tendons. These were tagged with a #2 Ethibond  suture and cut off their insertion on the intertrochanteric crest. The posterior  capsule was then developed into an acetabular-based flap from Posterior Superior off of the acetabulum out over the femoral neck and back posterior inferior to the acetabular rim. This flap was tagged with two #2 Ethibond sutures  and retracted protecting the sciatic nerve. This exposed the arthritic femoral head and osteophytes. The hip was then flexed and internally rotated, dislocating the femoral head and a standard neck cut performed 1 fingerbreadth above the lesser trochanter.  A spiked Cobra was placed in the cotyloid notch and a Hohmann retractor was then used to lever the femur anteriorly off of the anterior pelvic column. A posterior-inferior wing retractor was placed at the junction of the acetabulum and the ischium completing the acetabular exposure.We then removed the peripheral osteophytes and labrum from the acetabulum. We then reamed the acetabulum up to 52 mm with basket reamers obtaining good coverage in all quadrants, irrigated out with normal  saline solution and hammered into place a 52 mm  pinnacle cup in 45  degrees of abduction and about 20 degrees of anteversion. More  peripheral osteophytes removed and a trial 10-degree liner placed with the  index superior-posterior. The hip was then flexed and internally rotated exposing the  proximal femur, which was entered with the initiating reamer followed by  the axial reamers up to a 13.5 mm full depth and 14mm partial depth. We then conically reamed to 18D to the correct depth for a 42 base neck. The calcar was milled to 18Dsm. A trial cone and stem was inserted in the 25 degrees anteversion, with a +0 36mm trial head. Trial reduction was then performed and excellent stability was noted with at 90 of flexion with 75 of internal rotation and then full extension with maximal external rotation. The hip could not be dislocated in full extension. The knee could easily flex  to about 130 degrees. We also stretched the abductors at this point,  because of the preexisting adductor contractures. All trial components  were then removed. The acetabulum was irrigated out with normal saline  solution. A titanium [facility] was then screwed into place  followed by a 10-degree polyethylene liner index superior-posterior. On  the femoral side a 18Dsm ZTT1 cone was hammered into place, followed by a [id number] SROM stem in 25 degrees of anteversion. At this point, a +0 36 mm ceramic head was  hammered on the stem. The hip was reduced. We checked our stability  one more time and found to be excellent. The wound was once again  thoroughly irrigated out with normal saline solution pulse lavage. The  capsular flap and short external rotators were repaired back to the  intertrochanteric crest through drill holes with a #2 Ethibond suture.  The IT band was closed with running 1 Vicryl suture. The subcutaneous  tissue with 0 and 2-0 undyed Vicryl suture and the skin with running  interlocking 3-0 nylon suture. Dressing of Xeroform and Mepilex was   then applied. The patient was then unclamped, rolled supine, awaken extubated and taken to recovery room without difficulty in stable condition.   Nestor Lewandowsky 03/31/2012, 2:33 PM

## 2012-03-31 NOTE — Plan of Care (Signed)
Problem: Consults Goal: Diagnosis- Total Joint Replacement Outcome: Completed/Met Date Met:  03/31/12 Primary Total Hip

## 2012-03-31 NOTE — Anesthesia Preprocedure Evaluation (Addendum)
Anesthesia Evaluation  Patient identified by MRN, date of birth, ID band Patient awake    Reviewed: Allergy & Precautions, H&P , NPO status , Patient's Chart, lab work & pertinent test results, reviewed documented beta blocker date and time   History of Anesthesia Complications (+) PONV  Airway Mallampati: II  Neck ROM: full    Dental  (+) Teeth Intact and Dental Advisory Given   Pulmonary former smoker         Cardiovascular + angina + Past MI     Neuro/Psych    GI/Hepatic GERD-  ,  Endo/Other  Diabetes mellitus-, Well Controlled, Type 2, Oral Hypoglycemic Agentsobese  Renal/GU      Musculoskeletal   Abdominal   Peds  Hematology   Anesthesia Other Findings   Reproductive/Obstetrics                          Anesthesia Physical Anesthesia Plan  ASA: III  Anesthesia Plan: General   Post-op Pain Management:    Induction: Intravenous  Airway Management Planned: Oral ETT  Additional Equipment:   Intra-op Plan:   Post-operative Plan: Extubation in OR  Informed Consent: I have reviewed the patients History and Physical, chart, labs and discussed the procedure including the risks, benefits and alternatives for the proposed anesthesia with the patient or authorized representative who has indicated his/her understanding and acceptance.   Dental advisory given  Plan Discussed with: CRNA and Surgeon  Anesthesia Plan Comments:        Anesthesia Quick Evaluation

## 2012-03-31 NOTE — Anesthesia Postprocedure Evaluation (Signed)
Anesthesia Post Note  Patient: Regina Kim  Procedure(s) Performed: Procedure(s) (LRB): TOTAL HIP ARTHROPLASTY (Right)  Anesthesia type: General  Patient location: PACU  Post pain: Pain level controlled and Adequate analgesia  Post assessment: Post-op Vital signs reviewed, Patient's Cardiovascular Status Stable, Respiratory Function Stable, Patent Airway and Pain level controlled  Last Vitals:  Filed Vitals:   03/31/12 1513  BP: 141/68  Pulse: 80  Temp:   Resp: 14    Post vital signs: Reviewed and stable  Level of consciousness: awake, alert  and oriented  Complications: No apparent anesthesia complications

## 2012-03-31 NOTE — Transfer of Care (Signed)
Immediate Anesthesia Transfer of Care Note  Patient: Regina Kim  Procedure(s) Performed: Procedure(s) (LRB): TOTAL HIP ARTHROPLASTY (Right)  Patient Location: PACU  Anesthesia Type: General  Level of Consciousness: awake and patient cooperative  Airway & Oxygen Therapy: Patient Spontanous Breathing and Patient connected to face mask oxygen  Post-op Assessment: Report given to PACU RN  Post vital signs: Reviewed and stable  Complications: No apparent anesthesia complications

## 2012-04-01 LAB — CBC
MCV: 87.2 fL (ref 78.0–100.0)
Platelets: 222 10*3/uL (ref 150–400)
RBC: 3.67 MIL/uL — ABNORMAL LOW (ref 3.87–5.11)
RDW: 13.2 % (ref 11.5–15.5)
WBC: 9.4 10*3/uL (ref 4.0–10.5)

## 2012-04-01 LAB — BASIC METABOLIC PANEL
CO2: 22 mEq/L (ref 19–32)
Chloride: 96 mEq/L (ref 96–112)
Creatinine, Ser: 0.76 mg/dL (ref 0.50–1.10)
GFR calc Af Amer: >90 mL/min (ref >90)
Sodium: 132 mEq/L — ABNORMAL LOW (ref 135–145)

## 2012-04-01 LAB — GLUCOSE, CAPILLARY: Glucose-Capillary: 165 mg/dL — ABNORMAL HIGH (ref 70–99)

## 2012-04-01 MED ORDER — PANTOPRAZOLE SODIUM 40 MG PO TBEC
40.0000 mg | DELAYED_RELEASE_TABLET | Freq: Every day | ORAL | Status: DC
  Administered 2012-04-01 – 2012-04-02 (×2): 40 mg via ORAL
  Filled 2012-04-01: qty 1

## 2012-04-01 MED ORDER — HYDROMORPHONE HCL 2 MG PO TABS
2.0000 mg | ORAL_TABLET | ORAL | Status: DC | PRN
  Administered 2012-04-01 – 2012-04-02 (×6): 2 mg via ORAL
  Filled 2012-04-01: qty 1
  Filled 2012-04-01: qty 2
  Filled 2012-04-01 (×4): qty 1

## 2012-04-01 MED ORDER — PANTOPRAZOLE SODIUM 40 MG PO TBEC
40.0000 mg | DELAYED_RELEASE_TABLET | Freq: Every morning | ORAL | Status: DC
  Administered 2012-04-01: 40 mg via ORAL

## 2012-04-01 NOTE — Evaluation (Signed)
Occupational Therapy Evaluation and Discharge Patient Details Name: Regina Kim MRN: [id number] DOB: [birth year] Today's Date: 04/01/2012 Time: 7829-5621 OT Time Calculation (min): 24 min  OT Assessment / Plan / Recommendation Clinical Impression  This 61 yo female s/p THR presents to acute OT with all education completed based off of pt saying that she really needs to only go over shower stall transfer. D/C from acute OT.    OT Assessment       Follow Up Recommendations  No OT follow up    Barriers to Discharge      Equipment Recommendations  None recommended by OT    Recommendations for Other Services    Frequency       Precautions / Restrictions Precautions Precautions: Posterior Hip Precaution Booklet Issued: Yes (comment) Precaution Comments: Pt did well with adhering to precautions with mobility this session Restrictions Weight Bearing Restrictions: Yes RLE Weight Bearing: Weight bearing as tolerated       ADL  Eating/Feeding: Simulated;Independent Where Assessed - Eating/Feeding: Chair Grooming: Simulated;Set up Where Assessed - Grooming: Unsupported sitting Upper Body Bathing: Simulated;Set up Where Assessed - Upper Body Bathing: Unsupported sitting Lower Body Bathing: Simulated;Maximal assistance Where Assessed - Lower Body Bathing: Unsupported sit to stand Upper Body Dressing: Simulated;Set up Where Assessed - Upper Body Dressing: Supported standing Lower Body Dressing: Simulated;Maximal assistance Where Assessed - Lower Body Dressing: Unsupported sit to stand Toilet Transfer: Performed;Min guard Toilet Transfer Method: Sit to Barista: Raised toilet seat with arms (or 3-in-1 over toilet) Toileting - Clothing Manipulation and Hygiene: Performed;Independent Where Assessed - Toileting Clothing Manipulation and Hygiene: Sit to stand from 3-in-1 or toilet Tub/Shower Transfer:  (while pt sitting on 3-n-1 went over how she does in/out  shower) Equipment Used: Rolling walker ADL Comments: Pt able to verbalize to me that she will step into the her shower at home with her good leg and step out of the shower with her operated leg, while backing into the shower stall and stepping out of the shower stall    OT Diagnosis:    OT Problem List:   OT Treatment Interventions:     OT Goals    Visit Information  Last OT Received On: 04/01/12 Assistance Needed: +1 PT/OT Co-Evaluation/Treatment: Yes    Subjective Data  Subjective: I have some of the AE from when I had back surgery   Prior Functioning  Home Living Lives With: Spouse Available Help at Discharge: Family;Friend(s);Available 24 hours/day Type of Home: House Home Access: Stairs to enter [employer] of Steps: 4 Entrance Stairs-Rails: Right Home Layout: Two level Alternate Level Stairs-Number of Steps: 15 Alternate Level Stairs-Rails: Right;Left Bathroom Shower/Tub: Walk-in shower;Door [employer] Toilet: Pharmacist, community: Yes How Accessible: Accessible via walker Home Adaptive Equipment: None Prior Function Level of Independence: Independent Able to Take Stairs?: Yes Driving: Yes Vocation: Full time employment Communication Communication: No difficulties Dominant Hand: Right    Cognition  Overall Cognitive Status: Appears within functional limits for tasks assessed/performed Arousal/Alertness: Awake/alert Orientation Level: Appears intact for tasks assessed Behavior During Session: [facility] for tasks performed    Extremity/Trunk Assessment Right Upper Extremity Assessment RUE ROM/Strength/Tone: Within functional levels   Mobility Bed Mobility Bed Mobility: Not assessed Details for Bed Mobility Assistance: Pt sitting in recliner upon arrival & at end of session Transfers Sit to Stand: 4: Min guard;With armrests;With upper extremity assist;From chair/3-in-1 Stand to Sit: 4: Min guard;With upper extremity assist;With armrests;To  chair/3-in-1 Details for Transfer Assistance: Pt did well  with adhering to precautions & demonstrated safe technique.     Exercise    Balance Balance Balance Assessed: No  End of Session OT - End of Session Equipment Utilized During Treatment: Gait belt Activity Tolerance: Patient tolerated treatment well Patient left: in chair;with call bell/phone within reach;with family/visitor present (husband)   Evette Georges [phone] 04/01/2012, 1:19 PM

## 2012-04-01 NOTE — Progress Notes (Signed)
CARE MANAGEMENT NOTE 04/01/2012  Patient:  Regina Kim, Regina Kim   Account Number:  [id number]  Date Initiated:  04/01/2012  Documentation initiated by:  Vance Peper  Subjective/Objective Assessment:   61 yr old female s/p right total hip arthroplasty     Action/Plan:   CM spoke with patient regarding home health needs. Choice offered. patient preoperatively setup with [facility], no changes. CPM and 3in1 to be delivered by [facility]. Has family support at discharge   Anticipated DC Date:  04/02/2012   Anticipated DC Plan:  HOME W HOME HEALTH SERVICES      DC Planning Services  CM consult      [facility] Choice  HOME HEALTH  DURABLE MEDICAL EQUIPMENT   Choice offered to / List presented to:  C-1 Patient   DME arranged  3-N-1  WALKER - ROLLING      DME agency  [employer]     HH arranged  HH-2 PT      HH agency  [facility]   Status of service:  Completed, signed off  Discharge Disposition:  HOME W HOME HEALTH SERVICES   HOME HEALTH AGENCIES SERVING [place]   Agencies that are Medicare-Certified and are affiliated with [facility]  Telephone Number Address  [facility]   The [facility] has ownership interest in this company; however, you are under no obligation to use this agency. [phone] or  [phone] [address] [place], [place] [address]   Agencies that are Medicare-Certified and are not affiliated with [facility] Agency Telephone Number Address  [facility] [phone] Fax [phone] [address], [address] [place], [place]  [address]  [facility] [phone] or [phone] Fax [phone] [address] [place], [place] [address]  [facility] [phone] Fax [phone] [address] [place], [place] [address]  [facility] [phone] Fax [phone] [address] [place], [place]  [address]  [facility] [phone] Fax [phone] [address], [address] [place], [place] [address]  [facility] 2 [phone] [address] [place], [place] [address]  [facility] [phone]  [address] [place], [place] [address]  [facility] [phone] or [phone] Fax [phone] [address] [place], [place]  [address]  [facility] [phone] Fax [phone] [address] [place], [place]  [address]  [facility]  [phone] Fax [phone] [address] [place], [place] [address]

## 2012-04-01 NOTE — Progress Notes (Signed)
Utilization review completed. Norvin Ohlin, RN, BSN. 04/01/12  

## 2012-04-01 NOTE — Evaluation (Signed)
Physical Therapy Evaluation Patient Details Name: Regina Kim MRN: [id number] DOB: [birth year] Today's Date: 04/01/2012 Time: 0825-0903 PT Time Calculation (min): 38 min  PT Assessment / Plan / Recommendation Clinical Impression  Pt s/p R THA presenting with increased R LE pain, decreased R hip ROM, and decreased R LE strength. Patient requires increased assist for all mobility and RW for safe ambulation. Patient reports to have 24/7 assist at home however as 15 steps to access bed/bath and staying on first floor is not an option. Will follow patient progression and assess stair negotiation tomorrow to assure patient safety for returning home as patient desires.    PT Assessment  Patient needs continued PT services    Follow Up Recommendations  Home health PT;Supervision/Assistance - 24 hour    Barriers to Discharge Inaccessible home environment pt unable to stay on first floor, 15 steps with unilateral handrail to access bed/bath    lEquipment Recommendations  Rolling walker with 5" wheels;3 in 1 bedside comode    Recommendations for Other Services     Frequency 7X/week    Precautions / Restrictions Precautions Precautions: Posterior Hip Precaution Booklet Issued: Yes (comment) Precaution Comments: educated patient on post hip prec Restrictions Weight Bearing Restrictions: Yes RLE Weight Bearing: Weight bearing as tolerated   Pertinent Vitals/Pain 5/10 R hip      Mobility  Bed Mobility Bed Mobility: Supine to Sit Supine to Sit: 3: Mod assist;HOB flat Details for Bed Mobility Assistance: assist for R LE management and trunk elevation, v/c's for long sit technique Transfers Transfers: Sit to Stand;Stand to Sit Sit to Stand: 4: Min assist;With upper extremity assist;From bed Stand to Sit: 4: Min assist;To chair/3-in-1;With upper extremity assist Details for Transfer Assistance: pt dizzy upon initial stand requiring pt to return to sitting. Patient then able to tolerate  second attempt. v/c's for hand placement and safety Ambulation/Gait Ambulation/Gait Assistance: 4: Min assist Ambulation Distance (Feet): 10 Feet Assistive device: Rolling walker Ambulation/Gait Assistance Details: v/c's for sequencing, decreased R LE WBing, increased bilat UE WBing Gait Pattern: Step-to pattern;Decreased step length - right;Decreased stance time - right;Decreased stride length Gait velocity: slow Stairs: No    Exercises Total Joint Exercises Ankle Circles/Pumps: AROM;Both;10 reps;Supine Quad Sets: AROM;Right;10 reps;Supine Gluteal Sets: AROM;Left;10 reps;Supine Heel Slides: AAROM;Both;10 reps;Supine   PT Diagnosis: Abnormality of gait;Difficulty walking;Generalized weakness;Acute pain  PT Problem List: Decreased strength;Decreased range of motion;Decreased activity tolerance;Decreased balance PT Treatment Interventions: DME instruction;Gait training;Stair training;Functional mobility training;Therapeutic activities;Therapeutic exercise   PT Goals Acute Rehab PT Goals PT Goal Formulation: With patient Time For Goal Achievement: 04/08/12 Potential to Achieve Goals: Good Pt will go Supine/Side to Sit: with modified independence;with HOB 0 degrees PT Goal: Supine/Side to Sit - Progress: Goal set today Pt will go Sit to Supine/Side: with modified independence;with HOB 0 degrees PT Goal: Sit to Supine/Side - Progress: Goal set today Pt will go Sit to Stand: with modified independence;with upper extremity assist (up to RW.) PT Goal: Sit to Stand - Progress: Goal set today Pt will Ambulate: >150 feet;with modified independence;with rolling walker PT Goal: Ambulate - Progress: Goal set today Pt will Go Up / Down Stairs: Flight;with supervision;with rail(s) PT Goal: Up/Down Stairs - Progress: Goal set today Pt will Perform Home Exercise Program: Independently PT Goal: Perform Home Exercise Program - Progress: Goal set today Additional Goals Additional Goal #1: Pt able to  I'ly recall 3/3 post hip prec and be 100% compliant with them. PT Goal: Additional Goal #1 - Progress: Goal  set today  Visit Information  Last PT Received On: 04/01/12 Assistance Needed: +2 (nice for chair follow due to lightheadedness)    Subjective Data  Subjective: Pt received supine in bed with c/o 3-4/10 R hip pain   Prior Functioning  Home Living Lives With: Spouse Available Help at Discharge: Family;Friend(s);Available 24 hours/day (spouse and daughters) Type of Home: House Home Access: Stairs to enter [employer] of Steps: 4 Entrance Stairs-Rails: Right Home Layout: Two level Alternate Level Stairs-Number of Steps: 15 Alternate Level Stairs-Rails: Right;Left (R HR on first set/ L HR on second set) Bathroom Shower/Tub: Health visitor: Standard Bathroom Accessibility: Yes How Accessible: Accessible via walker (but not into where the toliet is) Home Adaptive Equipment: None Prior Function Level of Independence: Independent Able to Take Stairs?: Yes Driving: Yes Vocation: Full time employment Comments: desk drop Communication Communication: No difficulties Dominant Hand: Right    Cognition  Overall Cognitive Status: Appears within functional limits for tasks assessed/performed Arousal/Alertness: Awake/alert Orientation Level: Oriented X4 / Intact Behavior During Session: [facility] for tasks performed    Extremity/Trunk Assessment Right Upper Extremity Assessment RUE ROM/Strength/Tone: Within functional levels Left Upper Extremity Assessment LUE ROM/Strength/Tone: Within functional levels Right Lower Extremity Assessment RLE ROM/Strength/Tone: Deficits;Due to pain;Due to precautions RLE ROM/Strength/Tone Deficits: AA to 45 deg knee/hip ROM Left Lower Extremity Assessment LLE ROM/Strength/Tone: Within functional levels Trunk Assessment Trunk Assessment: Normal   Balance    End of Session PT - End of Session Equipment Utilized During  Treatment: Gait belt Activity Tolerance: Patient limited by fatigue Patient left: in chair;with call bell/phone within reach Nurse Communication: Mobility status   [name] 04/01/2012, 10:24 AM  Lewis Shock, PT, DPT Pager #: [phone] Office #: [phone]

## 2012-04-01 NOTE — Progress Notes (Signed)
Patient ID: Regina Kim, female   DOB: September 12, 1951, 62 y.o.   MRN: [id number] PATIENT ID: Regina Kim  MRN: [id number]  DOB/AGE:  Apr 21, 1951 / 61 y.o.  1 Day Post-Op Procedure(s) (LRB): TOTAL HIP ARTHROPLASTY (Right)    PROGRESS NOTE Subjective: Patient is alert, oriented,2x Nausea, 2x Vomiting, yes passing gas, no Bowel Movement. Taking PO sips. Denies SOB, Chest or Calf Pain. Using Incentive Spirometer, PAS in place. Ambulate WBAT Patient reports pain as 6 on 0-10 scale  .    Objective: Vital signs in last 24 hours: Filed Vitals:   03/31/12 1946 03/31/12 2109 04/01/12 0144 04/01/12 0608  BP:  112/56 113/64 100/50  Pulse:  74 96 82  Temp:  97 F (36.1 C) 97.1 F (36.2 C) 97.4 F (36.3 C)  TempSrc:  Oral    Resp:  18 18 18   Height: 5\' 6"  (1.676 m)     Weight: 87.272 kg (192 lb 6.4 oz)     SpO2:  98% 100% 95%      Intake/Output from previous day: I/O last 3 completed shifts: In: 3960 [P.O.:360; I.V.:3000; Other:100; IV Piggyback:500] Out: 1950 [Urine:1650; Blood:300]   Intake/Output this shift:     LABORATORY DATA:  Basename 04/01/12 0706 04/01/12 0627 03/31/12 2143 03/31/12 1641  WBC -- 9.4 -- --  HGB -- 10.6* -- --  HCT -- 32.0* -- --  PLT -- 222 -- --  NA -- 132* -- --  K -- 4.4 -- --  CL -- 96 -- --  CO2 -- 22 -- --  BUN -- 15 -- --  CREATININE -- 0.76 -- --  GLUCOSE -- 197* -- --  GLUCAP 165* -- 199* 178*  INR -- -- -- --  CALCIUM -- 8.4 -- --    Examination: Neurologically intact ABD soft Neurovascular intact Sensation intact distally Intact pulses distally Dorsiflexion/Plantar flexion intact Incision: scant drainage No cellulitis present Compartment soft} XR AP&Lat of hip shows well placed\fixed THA  Assessment:   1 Day Post-Op Procedure(s) (LRB): TOTAL HIP ARTHROPLASTY (Right) ADDITIONAL DIAGNOSIS:  Diabetes  Plan: PT/OT WBAT, THA  posterior precautions D/C percocet, start Dilaudid PO DVT Prophylaxis: SCDx72 hrs, ASA 325 mg BID  x 2 weeks  DISCHARGE PLAN: Home  DISCHARGE NEEDS: HHPT, HHRN, CPM, Walker, 3-in-1 comode seat and IV Antibiotics

## 2012-04-01 NOTE — Progress Notes (Signed)
PT Progress Note:     04/01/12 1100  PT Visit Information  Last PT Received On 04/01/12  Assistance Needed +1  PT Time Calculation  PT Start Time 1130  PT Stop Time 1150  PT Time Calculation (min) 20 min  Precautions  Precautions Posterior Hip  Precaution Booklet Issued Yes (comment)  Precaution Comments Pt did well with adhering to precautions with mobility this session  Restrictions  RLE Weight Bearing WBAT  Cognition  Overall Cognitive Status Appears within functional limits for tasks assessed/performed  Arousal/Alertness Awake/alert  Orientation Level Oriented X4 / Intact  Behavior During Session [facility] for tasks performed  Bed Mobility  Bed Mobility Not assessed  Details for Bed Mobility Assistance Pt sitting in recliner upon arrival & at end of session  Transfers  Transfers Sit to Stand;Stand to Sit  Sit to Stand 4: Min guard;With armrests;With upper extremity assist;From chair/3-in-1  Stand to Sit 4: Min guard;With upper extremity assist;With armrests;To chair/3-in-1  Details for Transfer Assistance Pt did well with adhering to precautions & demonstrated safe technique.    Ambulation/Gait  Ambulation/Gait Assistance 4: Min guard  Ambulation Distance (Feet) 50 Feet  Assistive device Rolling walker  Ambulation/Gait Assistance Details Cues to keep eyes open for increased safety.  Guarding for safety due to pt with dizziness in previous PT session earlier this AM.  Pt reports no dizziness this session.    Gait Pattern Step-to pattern;Decreased stance time - right;Decreased step length - left;Antalgic  General Gait Details Pt reports pain being limiting factor.    Stairs No  Engineering geologist No  Balance  Balance Assessed No  PT - End of Session  Equipment Utilized During Treatment Gait belt  Activity Tolerance Patient tolerated treatment well;Patient limited by pain  Patient left in chair;with call bell/phone within reach;with family/visitor present   PT - Assessment/Plan  Comments on Treatment Session Pt making steady progress with mobility.  Increased ambulation distance this session.  No reports of dizziness but states pain is limiting factor.     PT Plan Discharge plan remains appropriate  PT Frequency 7X/week  Follow Up Recommendations Home health PT;Supervision/Assistance - 24 hour  Equipment Recommended Rolling walker with 5" wheels;3 in 1 bedside comode  Acute Rehab PT Goals  Time For Goal Achievement 04/08/12  Potential to Achieve Goals Good  PT Goal: Sit to Stand - Progress Progressing toward goal  PT Goal: Ambulate - Progress Progressing toward goal  Additional Goals  PT Goal: Additional Goal #1 - Progress Progressing toward goal  PT General Charges  $$ ACUTE PT VISIT 1 Procedure  PT Treatments  $Gait Training 8-22 mins     Verdell Face, [place] [phone] 04/01/2012

## 2012-04-02 ENCOUNTER — Encounter (HOSPITAL_COMMUNITY): Payer: Self-pay | Admitting: Orthopedic Surgery

## 2012-04-02 DIAGNOSIS — M87059 Idiopathic aseptic necrosis of unspecified femur: Secondary | ICD-10-CM | POA: Diagnosis present

## 2012-04-02 LAB — CBC
HCT: 29.9 % — ABNORMAL LOW (ref 36.0–46.0)
Hemoglobin: 10 g/dL — ABNORMAL LOW (ref 12.0–15.0)
MCHC: 33.4 g/dL (ref 30.0–36.0)
MCV: 87.9 fL (ref 78.0–100.0)
WBC: 7.7 10*3/uL (ref 4.0–10.5)

## 2012-04-02 LAB — GLUCOSE, CAPILLARY

## 2012-04-02 MED ORDER — ASPIRIN 325 MG PO TBEC: 325.0000 mg | DELAYED_RELEASE_TABLET | Freq: Two times a day (BID) | ORAL | Status: AC

## 2012-04-02 MED ORDER — HYDROMORPHONE HCL 2 MG PO TABS: 2.0000 mg | ORAL_TABLET | ORAL | Status: AC | PRN

## 2012-04-02 MED ORDER — METHOCARBAMOL 500 MG PO TABS: 500.0000 mg | ORAL_TABLET | Freq: Four times a day (QID) | ORAL | Status: AC | PRN

## 2012-04-02 NOTE — Discharge Summary (Signed)
Patient ID: Regina Kim MRN: [id number] DOB/AGE: 1950/12/09 61 y.o.  Admit date: 03/31/2012 Discharge date: 04/02/2012  Admission Diagnoses:  Active Problems:  Avascular necrosis of hip   Discharge Diagnoses:  Same  Past Medical History  Diagnosis Date  . Diabetes mellitus     dx'd 4 yrs ago-NIDDM  . GERD (gastroesophageal reflux disease)   . Arthritis   . SUI (stress urinary incontinence, female) 05/08/2011  . 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 PONV (postoperative nausea and vomiting)   . Angina     history of angina- none in 5 yrs  . Myocardial infarction 2005    no blockage found in heart cath  . Asthma     years ago & related to GERD  . UTI (urinary tract infection)   . Urine protein increased     Surgeries: Procedure(s): TOTAL HIP ARTHROPLASTY on 03/31/2012   Consultants:    Discharged Condition: Improved  Hospital Course: Regina Kim is an 61 y.o. female who was admitted 03/31/2012 for operative treatment of<principal problem not specified>. Patient has severe unremitting pain that affects sleep, daily activities, and work/hobbies. After pre-op clearance the patient was taken to the operating room on 03/31/2012 and underwent  Procedure(s): TOTAL HIP ARTHROPLASTY.    Patient was given perioperative antibiotics: Anti-infectives     Start     Dose/Rate Route Frequency Ordered Stop   03/30/12 1505   ceFAZolin (ANCEF) IVPB 2 g/50 mL premix        2 g 100 mL/hr over 30 Minutes Intravenous 60 min pre-op 03/30/12 1505 03/31/12 1327           Patient was given sequential compression devices, early ambulation, and chemoprophylaxis to prevent DVT.  Patient benefited maximally from hospital stay and there were no complications.    Recent vital signs: Patient Vitals for the past 24 hrs:  BP Temp Pulse Resp SpO2  04/02/12 0641 127/56 mmHg 98.6 F (37 C) 89  18  96 %  04-11-2012 2131 117/58 mmHg 98  F (36.7 C) 93  18  95 %  11-Apr-2012 1400 99/63 mmHg 98.1 F (36.7 C) 92  16  97 %     Recent laboratory studies:  [facility] 04/02/12 0610 2012-04-11 0627  WBC 7.7 9.4  HGB 10.0* 10.6*  HCT 29.9* 32.0*  PLT 171 222  NA -- 132*  K -- 4.4  CL -- 96  CO2 -- 22  BUN -- 15  CREATININE -- 0.76  GLUCOSE -- 197*  INR -- --  CALCIUM -- 8.4     Discharge Medications:   Medication List  As of 04/02/2012  8:19 AM   TAKE these medications         aspirin 325 MG EC tablet   Take 1 tablet (325 mg total) by mouth 2 (two) times daily.      HYDROmorphone 2 MG tablet   Commonly known as: DILAUDID   Take 1-2 tablets (2-4 mg total) by mouth every 4 (four) hours as needed.      ibuprofen 800 MG tablet   Commonly known as: ADVIL,MOTRIN   Take 800 mg by mouth 2 (two) times daily as needed. For pain      lisinopril 10 MG tablet   Commonly known as: PRINIVIL,ZESTRIL   Take 10 mg by mouth every evening.      metFORMIN 1000 MG tablet   Commonly known  as: GLUCOPHAGE   Take 1,000 mg by mouth 2 (two) times daily with a meal.      methocarbamol 500 MG tablet   Commonly known as: ROBAXIN   Take 1 tablet (500 mg total) by mouth every 6 (six) hours as needed.      rosuvastatin 10 MG tablet   Commonly known as: CRESTOR   Take 10 mg by mouth daily.      VICTOZA 18 MG/3ML Soln   Generic drug: Liraglutide   Inject 1.2 mLs into the skin every morning.            Diagnostic Studies: Dg Chest 2 View  03/24/2012  *RADIOLOGY REPORT*  Clinical Data: Preop for total hip arthroplasty.  Avascular necrosis.  CHEST - 2 VIEW  Comparison: Two-view chest 05/16/2009.  Findings: The heart size is normal.  Mild interstitial coarsening at the lung bases is stable.  No focal airspace disease is evident. Mild leftward curvature of the mid thoracic spine is stable.  The visualized soft tissues and bony thorax are otherwise unremarkable.  IMPRESSION:  1.  No acute cardiopulmonary disease or significant interval change.   Original Report Authenticated By: Jamesetta Orleans. MATTERN, M.D.   Dg Pelvis Portable  03/31/2012  *RADIOLOGY REPORT*  Clinical Data: Postop right hip.  PORTABLE PELVIS,PORTABLE RIGHT HIP - 1 VIEW  Comparison: None.  Findings: Total right hip prosthesis appears in satisfactory position without complication noted.  IMPRESSION: Total right hip prosthesis appears in satisfactory position without complication noted.  Original Report Authenticated By: Fuller Canada, M.D.   Dg Hip Portable 1 View Right  03/31/2012  *RADIOLOGY REPORT*  Clinical Data: Postop right hip.  PORTABLE PELVIS,PORTABLE RIGHT HIP - 1 VIEW  Comparison: None.  Findings: Total right hip prosthesis appears in satisfactory position without complication noted.  IMPRESSION: Total right hip prosthesis appears in satisfactory position without complication noted.  Original Report Authenticated By: Fuller Canada, M.D.    Disposition: 01-Home or Self Care  Discharge Orders    Future Orders Please Complete By Expires   Increase activity slowly      Walker       May shower / Bathe      Driving Restrictions      Comments:   No driving for 2 weeks.   Change dressing (specify)      Comments:   Dressing change as needed.   Call MD for:  temperature >100.4      Call MD for:  severe uncontrolled pain      Call MD for:  redness, tenderness, or signs of infection (pain, swelling, redness, odor or green/yellow discharge around incision site)      Discharge instructions      Comments:   F/U with Dr. Turner Daniels in 10-12 days.         SignedHazle Nordmann. 04/02/2012, 8:19 AM

## 2012-04-02 NOTE — Progress Notes (Signed)
Physical Therapy Treatment Note   04/02/12 1418  PT Visit Information  Last PT Received On 04/02/12  Assistance Needed +1  PT Time Calculation  PT Start Time 1418  PT Stop Time 1436  PT Time Calculation (min) 18 min  Subjective Data  Subjective Pt received sitting up in chair.  Precautions  Precautions Posterior Hip  Precaution Comments pt able to recall 3/3  Restrictions  RLE Weight Bearing WBAT  Cognition  Overall Cognitive Status Appears within functional limits for tasks assessed/performed  Arousal/Alertness Awake/alert  Orientation Level Appears intact for tasks assessed  Behavior During Session [facility] for tasks performed  Bed Mobility  Bed Mobility Not assessed  Transfers  Transfers Sit to Stand;Stand to Sit  Sit to Stand 5: Supervision;With upper extremity assist;From chair/3-in-1  Stand to Sit With upper extremity assist;With armrests;To chair/3-in-1;5: Supervision  Details for Transfer Assistance pt with good technique  Ambulation/Gait  Ambulation/Gait Assistance 5: Supervision  Ambulation Distance (Feet) 75 Feet  Assistive device Rolling walker  Ambulation/Gait Assistance Details focused on reciprocal gait pattern  Gait Pattern Step-through pattern;Decreased step length - right;Decreased stance time - right  Exercises  Exercises Total Joint  Total Joint Exercises  Hip ABduction/ADduction AROM;Right;10 reps;Standing  Long Arc Quad AROM;Right;10 reps;Seated  Marching in Standing AROM;Right;10 reps  Standing Hip Extension AROM;Right;10 reps;Standing  PT - End of Session  Equipment Utilized During Treatment Gait belt  Activity Tolerance Patient tolerated treatment well  Patient left in chair;with call bell/phone within reach;with family/visitor present  PT - Assessment/Plan  Comments on Treatment Session Pt progressing well towards all goals and is safe to d/c home today.  PT Plan Discharge plan remains appropriate  PT Frequency 7X/week  Follow Up Recommendations  Home health PT;Supervision/Assistance - 24 hour  Equipment Recommended None recommended by OT  Acute Rehab PT Goals  PT Goal: Sit to Stand - Progress Progressing toward goal  PT Goal: Ambulate - Progress Progressing toward goal  PT Goal: Perform Home Exercise Program - Progress Progressing toward goal  Additional Goals  PT Goal: Additional Goal #1 - Progress Met  PT General Charges  $$ ACUTE PT VISIT 1 Procedure  PT Treatments  $Therapeutic Exercise 8-22 mins    Lewis Shock, PT, DPT Pager #: [phone] Office #: [phone]

## 2012-04-02 NOTE — Progress Notes (Signed)
Physical Therapy Treatment Note   04/02/12 1016  PT Visit Information  Last PT Received On 04/02/12  Assistance Needed +1  PT Time Calculation  PT Start Time 1016  PT Stop Time 1035  PT Time Calculation (min) 19 min  Subjective Data  Subjective Pt received on commode.  Precautions  Precautions Posterior Hip  Restrictions  RLE Weight Bearing WBAT  Cognition  Overall Cognitive Status Appears within functional limits for tasks assessed/performed  Arousal/Alertness Awake/alert  Orientation Level Appears intact for tasks assessed  Behavior During Session [facility] for tasks performed  Bed Mobility  Bed Mobility Not assessed  Transfers  Transfers Sit to Stand;Stand to Sit  Sit to Stand 5: Supervision;With upper extremity assist;From chair/3-in-1  Stand to Sit 4: Min guard;With upper extremity assist;With armrests;To chair/3-in-1  Details for Transfer Assistance pt demo'd good technique.  Ambulation/Gait  Ambulation/Gait Assistance 5: Supervision  Ambulation Distance (Feet) 150 Feet  Assistive device Rolling walker  Ambulation/Gait Assistance Details pt with good technique  Gait Pattern Step-to pattern;Decreased stance time - right;Decreased step length - left;Antalgic  Gait velocity slow  Stairs Yes  Stairs Assistance 4: Min assist  Stairs Assistance Details (indicate cue type and reason) trialed both using R handrail and asc/desc sideways as well as L hand rail and R HHA to mimic home set up..  Number of Stairs 6   PT - End of Session  Equipment Utilized During Treatment Gait belt  Activity Tolerance Patient tolerated treatment well  Patient left in chair;with call bell/phone within reach;with family/visitor present  PT - Assessment/Plan  Comments on Treatment Session Pt progressing well towards all goals. Patient tolerated stair negotiation well and is safe to d/c home this date with support of family and RW.  PT Plan Discharge plan remains appropriate  PT Frequency 7X/week    Follow Up Recommendations Home health PT;Supervision/Assistance - 24 hour  Equipment Recommended None recommended by OT  Acute Rehab PT Goals  Potential to Achieve Goals Good  PT Goal: Sit to Stand - Progress Progressing toward goal  PT Goal: Ambulate - Progress Progressing toward goal  PT Goal: Up/Down Stairs - Progress Progressing toward goal  Additional Goals  PT Goal: Additional Goal #1 - Progress Progressing toward goal  PT General Charges  $$ ACUTE PT VISIT 1 Procedure  PT Treatments  $Gait Training 8-22 mins     Pain: 4/10 R hip pain  Lewis Shock, PT, DPT Pager #: [phone] Office #: [phone]

## 2012-04-02 NOTE — Progress Notes (Signed)
PATIENT ID: NICKOLA LENIG  MRN: [id number]  DOB/AGE:  1951/09/17 / 61 y.o.  2 Days Post-Op Procedure(s) (LRB): TOTAL HIP ARTHROPLASTY (Right)    PROGRESS NOTE Subjective: Patient is alert, oriented,no Nausea, no Vomiting, yes passing gas, no Bowel Movement. Taking PO well. Denies SOB, Chest or Calf Pain. Using Incentive Spirometer, PAS in place. Ambulating with PT.   Patient reports pain as moderate  .    Objective: Vital signs in last 24 hours: Filed Vitals:   04/01/12 0608 04/01/12 1400 04/01/12 2131 04/02/12 0641  BP: 100/50 99/63 117/58 127/56  Pulse: 82 92 93 89  Temp: 97.4 F (36.3 C) 98.1 F (36.7 C) 98 F (36.7 C) 98.6 F (37 C)  TempSrc:      Resp: 18 16 18 18   Height:      Weight:      SpO2: 95% 97% 95% 96%      Intake/Output from previous day: I/O last 3 completed shifts: In: 3180 [P.O.:1440; I.V.:1740] Out: 850 [Urine:850]   Intake/Output this shift:     LABORATORY DATA:  Basename 04/02/12 0707 04/02/12 0610 04/01/12 2229 04/01/12 1633 04/01/12 0627  WBC -- 7.7 -- -- 9.4  HGB -- 10.0* -- -- 10.6*  HCT -- 29.9* -- -- 32.0*  PLT -- 171 -- -- 222  NA -- -- -- -- 132*  K -- -- -- -- 4.4  CL -- -- -- -- 96  CO2 -- -- -- -- 22  BUN -- -- -- -- 15  CREATININE -- -- -- -- 0.76  GLUCOSE -- -- -- -- 197*  GLUCAP 138* -- 151* 179* --  INR -- -- -- -- --  CALCIUM -- -- -- -- 8.4    Examination: Neurologically intact ABD soft Neurovascular intact Sensation intact distally Intact pulses distally Dorsiflexion/Plantar flexion intact Incision: scant drainage} XR AP&Lat of hip shows well placed\fixed THA  Assessment:   2 Days Post-Op Procedure(s) (LRB): TOTAL HIP ARTHROPLASTY (Right) ADDITIONAL DIAGNOSIS:  none  Plan: PT/OT WBAT, THA  posterior precautions  DVT Prophylaxis: SCDx72 hrs, ASA 325 mg BID x 2 weeks  DISCHARGE PLAN: Home today  DISCHARGE NEEDS: HHPT, HHRN, Walker and 3-in-1 comode seat

## 2012-07-05 ENCOUNTER — Other Ambulatory Visit: Payer: Self-pay | Admitting: Orthopedic Surgery

## 2012-07-05 DIAGNOSIS — M5410 Radiculopathy, site unspecified: Secondary | ICD-10-CM

## 2012-07-07 ENCOUNTER — Ambulatory Visit
Admission: RE | Admit: 2012-07-07 | Discharge: 2012-07-07 | Disposition: A | Discharge status: Home / Self Care | Payer: 59 | Source: Ambulatory Visit | Attending: Orthopedic Surgery | Admitting: Orthopedic Surgery

## 2012-07-07 VITALS — BP 103/56 | HR 75

## 2012-07-07 DIAGNOSIS — M5410 Radiculopathy, site unspecified: Secondary | ICD-10-CM

## 2012-07-07 MED ORDER — DIAZEPAM 5 MG PO TABS
10.0000 mg | ORAL_TABLET | Freq: Once | ORAL | Status: AC
  Administered 2012-07-07: 10 mg via ORAL

## 2012-07-07 MED ORDER — IOHEXOL 180 MG/ML  SOLN
15.0000 mL | Freq: Once | INTRAMUSCULAR | Status: AC | PRN
  Administered 2012-07-07: 15 mL via INTRATHECAL

## 2012-07-11 ENCOUNTER — Encounter (HOSPITAL_BASED_OUTPATIENT_CLINIC_OR_DEPARTMENT_OTHER): Payer: Self-pay | Admitting: *Deleted

## 2012-07-11 ENCOUNTER — Emergency Department (HOSPITAL_BASED_OUTPATIENT_CLINIC_OR_DEPARTMENT_OTHER)
Admission: EM | Admit: 2012-07-11 | Discharge: 2012-07-11 | Disposition: A | Discharge status: Home / Self Care | Payer: 59 | Attending: Emergency Medicine | Admitting: Emergency Medicine

## 2012-07-11 DIAGNOSIS — K219 Gastro-esophageal reflux disease without esophagitis: Secondary | ICD-10-CM | POA: Insufficient documentation

## 2012-07-11 DIAGNOSIS — R51 Headache: Secondary | ICD-10-CM | POA: Insufficient documentation

## 2012-07-11 DIAGNOSIS — I252 Old myocardial infarction: Secondary | ICD-10-CM | POA: Insufficient documentation

## 2012-07-11 DIAGNOSIS — Z79899 Other long term (current) drug therapy: Secondary | ICD-10-CM | POA: Insufficient documentation

## 2012-07-11 DIAGNOSIS — Z87891 Personal history of nicotine dependence: Secondary | ICD-10-CM | POA: Insufficient documentation

## 2012-07-11 DIAGNOSIS — E119 Type 2 diabetes mellitus without complications: Secondary | ICD-10-CM | POA: Insufficient documentation

## 2012-07-11 LAB — CBC WITH DIFFERENTIAL/PLATELET
Eosinophils Relative: 2 % (ref 0–5)
HCT: 35 % — ABNORMAL LOW (ref 36.0–46.0)
Hemoglobin: 11.8 g/dL — ABNORMAL LOW (ref 12.0–15.0)
Lymphocytes Relative: 33 % (ref 12–46)
Lymphs Abs: 1.9 10*3/uL (ref 0.7–4.0)
MCH: 28.6 pg (ref 26.0–34.0)
MCV: 85 fL (ref 78.0–100.0)
Monocytes Relative: 11 % (ref 3–12)
Platelets: 183 10*3/uL (ref 150–400)
RBC: 4.12 MIL/uL (ref 3.87–5.11)
WBC: 5.8 10*3/uL (ref 4.0–10.5)

## 2012-07-11 LAB — BASIC METABOLIC PANEL
CO2: 22 mEq/L (ref 19–32)
Chloride: 100 mEq/L (ref 96–112)
Creatinine, Ser: 0.9 mg/dL (ref 0.50–1.10)
Glucose, Bld: 136 mg/dL — ABNORMAL HIGH (ref 70–99)
Sodium: 135 mEq/L (ref 135–145)

## 2012-07-11 MED ORDER — SODIUM CHLORIDE 0.9 % IV SOLN
Freq: Once | INTRAVENOUS | Status: AC
  Administered 2012-07-11: 17:00:00 via INTRAVENOUS

## 2012-07-11 MED ORDER — ONDANSETRON HCL 4 MG/2ML IJ SOLN
4.0000 mg | Freq: Once | INTRAMUSCULAR | Status: AC
  Administered 2012-07-11: 4 mg via INTRAVENOUS
  Filled 2012-07-11: qty 2

## 2012-07-11 MED ORDER — HYDROMORPHONE HCL PF 1 MG/ML IJ SOLN
1.0000 mg | Freq: Once | INTRAMUSCULAR | Status: AC
  Administered 2012-07-11: 1 mg via INTRAVENOUS
  Filled 2012-07-11: qty 1

## 2012-07-11 NOTE — ED Provider Notes (Signed)
History     CSN: [id number]  Arrival date & time 07/11/12  1438   First MD Initiated Contact with Patient 07/11/12 1600      Chief Complaint  Patient presents with  . Headache    (Consider location/radiation/quality/duration/timing/severity/associated sxs/prior treatment) Patient is a 61 y.o. female presenting with headaches. The history is provided by the patient. No language interpreter was used.  Headache  This is a new problem. Episode onset: 4 days. The problem occurs constantly. The problem has not changed since onset.The headache is associated with nothing. The pain is located in the bilateral region. The quality of the pain is described as throbbing and sharp. The pain is at a severity of 9/10. The pain is severe. The pain does not radiate. Pertinent negatives include no vomiting. She has tried nothing for the symptoms.  Pt complains of a headache since Thursday.  Pt had a myelogram on Wednesday.  Pt reports no relief with caffeine and fluids at home  Past Medical History  Diagnosis Date  . Diabetes mellitus     dx'd 4 yrs ago-NIDDM  . GERD (gastroesophageal reflux disease)   . Arthritis   . SUI (stress urinary incontinence, female) 05/08/2011  . 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 PONV (postoperative nausea and vomiting)   . Angina     history of angina- none in 5 yrs  . Myocardial infarction 2005    no blockage found in heart cath  . Asthma     years ago & related to GERD  . UTI (urinary tract infection)   . Urine protein increased     Past Surgical History  Procedure Date  . Cardiac catheterization   . Cystoscopy   . Abdominal hysterectomy   . Back surgery   . Bladder surgery 05/08/11    Bladder Sling  . Bladder surgery 05/08/11    Bladder Sling  . Bladder suspension 05/08/2011    Procedure: TRANSVAGINAL TAPE (TVT) PROCEDURE;  Surgeon: Roselle Locus II;  Location: [facility];  Service: Gynecology;   Laterality: N/A;  Transobturator Tape With Cystoscopy  . Total hip arthroplasty 03/31/2012    Procedure: TOTAL HIP ARTHROPLASTY;  Surgeon: Nestor Lewandowsky, MD;  Location: [facility] OR;  Service: Orthopedics;  Laterality: Right;    History reviewed. No pertinent family history.  History  Substance Use Topics  . Smoking status: Former Smoker -- 1.0 packs/day for 30 years    Types: Cigarettes    Quit date: 03/23/2004  . Smokeless tobacco: Not on file  . Alcohol Use: 0.6 oz/week    1 Glasses of wine per week    OB History    Grav Para Term Preterm Abortions TAB SAB Ect Mult Living   5 5 5       5       Review of Systems  Gastrointestinal: Negative for vomiting.  Neurological: Positive for headaches.  All other systems reviewed and are negative.    Allergies  Nickel and Vicodin  Home Medications   Current Outpatient Rx  Name Route Sig Dispense Refill  . IBUPROFEN 800 MG PO TABS Oral Take 800 mg by mouth 2 (two) times daily as needed. For pain    . LIRAGLUTIDE 18 MG/3ML [place] SOLN Subcutaneous Inject 1.2 mLs into the skin every morning.    Marland Kitchen LISINOPRIL 10 MG PO TABS Oral Take 10 mg by mouth every evening.     Marland Kitchen  METFORMIN HCL 1000 MG PO TABS Oral Take 1,000 mg by mouth 2 (two) times daily with a meal.    . ROSUVASTATIN CALCIUM 10 MG PO TABS Oral Take 10 mg by mouth daily.        BP 149/87  Pulse 86  Temp 97.5 F (36.4 C) (Oral)  Resp 20  Ht 5' 6.5" (1.689 m)  Wt 195 lb (88.451 kg)  BMI 31.00 kg/m2  SpO2 100%  Physical Exam  Nursing note and vitals reviewed. Constitutional: She is oriented to person, place, and time. She appears well-developed and well-nourished.  HENT:  Head: Normocephalic and atraumatic.  Eyes: Conjunctivae normal and EOM are normal. Pupils are equal, round, and reactive to light.  Neck: Normal range of motion.  Cardiovascular: Normal rate, regular rhythm and normal heart sounds.   Pulmonary/Chest: Effort normal and breath sounds normal.  Abdominal: Soft.  Bowel sounds are normal.  Musculoskeletal: Normal range of motion.  Neurological: She is alert and oriented to person, place, and time. She has normal reflexes.  Skin: There is erythema.  Psychiatric: She has a normal mood and affect.    ED Course  Procedures (including critical care time)  Labs Reviewed - No data to display No results found.   1. Headache       MDM  Pt given iv fluids and dilaudid.  Pt feels better.    I spoke to Dr. Constance Goltz who advised to have pt call [facility] on Monday morning to have a blood patch.   Pt counseled on follow up.   Pt has pain medication at home        Lonia Skinner Luke, [place] 07/11/12 2349

## 2012-07-11 NOTE — ED Notes (Signed)
Pt has a ride- rates pain 1/10 at time of d/c

## 2012-07-11 NOTE — ED Notes (Signed)
Headache s/p myelogram Wed PERL

## 2012-07-12 ENCOUNTER — Ambulatory Visit
Admission: RE | Admit: 2012-07-12 | Discharge: 2012-07-12 | Disposition: A | Discharge status: Home / Self Care | Payer: 59 | Source: Ambulatory Visit | Attending: Emergency Medicine | Admitting: Emergency Medicine

## 2012-07-12 ENCOUNTER — Other Ambulatory Visit: Payer: Self-pay | Admitting: Emergency Medicine

## 2012-07-12 VITALS — BP 128/74 | HR 66

## 2012-07-12 DIAGNOSIS — G971 Other reaction to spinal and lumbar puncture: Secondary | ICD-10-CM

## 2012-07-12 MED ORDER — IOHEXOL 180 MG/ML  SOLN
1.0000 mL | Freq: Once | INTRAMUSCULAR | Status: AC | PRN
  Administered 2012-07-12: 1 mL via EPIDURAL

## 2012-07-12 NOTE — Progress Notes (Signed)
20cc blood drawn from right hand for procedure; site unremarkable.  Larina Earthly, RN

## 2012-07-12 NOTE — ED Provider Notes (Signed)
Medical screening examination/treatment/procedure(s) were performed by non-physician practitioner and as supervising physician I was immediately available for consultation/collaboration.   Carleene Cooper III, MD 07/12/12 1200

## 2012-07-12 NOTE — ED Notes (Signed)
Pt reported to [facility] per d/c instructions. Reece Levy, Rn at [facility] called for order. Order written by Dr. Kennyth Arnold for Blood Patch for post myelogram headache. Order faxed at [phone] to [phone].

## 2012-11-30 ENCOUNTER — Encounter: Payer: Self-pay | Admitting: Gastroenterology

## 2012-12-04 ENCOUNTER — Other Ambulatory Visit: Payer: Self-pay

## 2013-04-27 ENCOUNTER — Other Ambulatory Visit (HOSPITAL_COMMUNITY): Payer: Self-pay | Admitting: Orthopedic Surgery

## 2013-04-27 DIAGNOSIS — T8484XA Pain due to internal orthopedic prosthetic devices, implants and grafts, initial encounter: Secondary | ICD-10-CM

## 2013-04-29 ENCOUNTER — Ambulatory Visit (HOSPITAL_COMMUNITY)
Admission: RE | Admit: 2013-04-29 | Discharge: 2013-04-29 | Disposition: A | Discharge status: Home / Self Care | Payer: 59 | Source: Ambulatory Visit | Attending: Orthopedic Surgery | Admitting: Orthopedic Surgery

## 2013-04-29 DIAGNOSIS — M87059 Idiopathic aseptic necrosis of unspecified femur: Secondary | ICD-10-CM | POA: Insufficient documentation

## 2013-04-29 DIAGNOSIS — Z96649 Presence of unspecified artificial hip joint: Secondary | ICD-10-CM | POA: Insufficient documentation

## 2013-04-29 DIAGNOSIS — T8484XA Pain due to internal orthopedic prosthetic devices, implants and grafts, initial encounter: Secondary | ICD-10-CM

## 2013-04-29 DIAGNOSIS — M76899 Other specified enthesopathies of unspecified lower limb, excluding foot: Secondary | ICD-10-CM | POA: Insufficient documentation

## 2013-04-29 DIAGNOSIS — M25559 Pain in unspecified hip: Secondary | ICD-10-CM | POA: Insufficient documentation

## 2013-06-08 ENCOUNTER — Emergency Department (HOSPITAL_COMMUNITY)
Admission: EM | Admit: 2013-06-08 | Discharge: 2013-06-08 | Disposition: A | Discharge status: Home / Self Care | Payer: Worker's Compensation | Attending: Emergency Medicine | Admitting: Emergency Medicine

## 2013-06-08 ENCOUNTER — Emergency Department (HOSPITAL_COMMUNITY): Payer: Worker's Compensation

## 2013-06-08 ENCOUNTER — Encounter (HOSPITAL_COMMUNITY): Payer: Self-pay | Admitting: Emergency Medicine

## 2013-06-08 DIAGNOSIS — Y929 Unspecified place or not applicable: Secondary | ICD-10-CM | POA: Insufficient documentation

## 2013-06-08 DIAGNOSIS — W1841XA Slipping, tripping and stumbling without falling due to stepping on object, initial encounter: Secondary | ICD-10-CM

## 2013-06-08 DIAGNOSIS — W010XXA Fall on same level from slipping, tripping and stumbling without subsequent striking against object, initial encounter: Secondary | ICD-10-CM | POA: Insufficient documentation

## 2013-06-08 DIAGNOSIS — Z79899 Other long term (current) drug therapy: Secondary | ICD-10-CM | POA: Insufficient documentation

## 2013-06-08 DIAGNOSIS — S59909A Unspecified injury of unspecified elbow, initial encounter: Secondary | ICD-10-CM | POA: Insufficient documentation

## 2013-06-08 DIAGNOSIS — K219 Gastro-esophageal reflux disease without esophagitis: Secondary | ICD-10-CM | POA: Insufficient documentation

## 2013-06-08 DIAGNOSIS — S8990XA Unspecified injury of unspecified lower leg, initial encounter: Secondary | ICD-10-CM | POA: Insufficient documentation

## 2013-06-08 DIAGNOSIS — Y990 Civilian activity done for income or pay: Secondary | ICD-10-CM | POA: Insufficient documentation

## 2013-06-08 DIAGNOSIS — Z8679 Personal history of other diseases of the circulatory system: Secondary | ICD-10-CM | POA: Insufficient documentation

## 2013-06-08 DIAGNOSIS — IMO0002 Reserved for concepts with insufficient information to code with codable children: Secondary | ICD-10-CM | POA: Insufficient documentation

## 2013-06-08 DIAGNOSIS — Z8739 Personal history of other diseases of the musculoskeletal system and connective tissue: Secondary | ICD-10-CM | POA: Insufficient documentation

## 2013-06-08 DIAGNOSIS — T07XXXA Unspecified multiple injuries, initial encounter: Secondary | ICD-10-CM

## 2013-06-08 DIAGNOSIS — Z87891 Personal history of nicotine dependence: Secondary | ICD-10-CM | POA: Insufficient documentation

## 2013-06-08 DIAGNOSIS — S6990XA Unspecified injury of unspecified wrist, hand and finger(s), initial encounter: Secondary | ICD-10-CM | POA: Insufficient documentation

## 2013-06-08 DIAGNOSIS — S79919A Unspecified injury of unspecified hip, initial encounter: Secondary | ICD-10-CM | POA: Insufficient documentation

## 2013-06-08 DIAGNOSIS — J45909 Unspecified asthma, uncomplicated: Secondary | ICD-10-CM | POA: Insufficient documentation

## 2013-06-08 DIAGNOSIS — Z87448 Personal history of other diseases of urinary system: Secondary | ICD-10-CM | POA: Insufficient documentation

## 2013-06-08 DIAGNOSIS — Y9389 Activity, other specified: Secondary | ICD-10-CM | POA: Insufficient documentation

## 2013-06-08 DIAGNOSIS — I252 Old myocardial infarction: Secondary | ICD-10-CM | POA: Insufficient documentation

## 2013-06-08 DIAGNOSIS — M25562 Pain in left knee: Secondary | ICD-10-CM

## 2013-06-08 DIAGNOSIS — E119 Type 2 diabetes mellitus without complications: Secondary | ICD-10-CM | POA: Insufficient documentation

## 2013-06-08 DIAGNOSIS — Z87440 Personal history of urinary (tract) infections: Secondary | ICD-10-CM | POA: Insufficient documentation

## 2013-06-08 NOTE — ED Provider Notes (Signed)
CSN: [id number]     Arrival date & time 06/08/13  4098 History     First MD Initiated Contact with Patient 06/08/13 [phone]     Chief Complaint  Patient presents with  . Fall  . Knee Pain  . Hip Pain  . Arm Pain   (Consider location/radiation/quality/duration/timing/severity/associated sxs/prior Treatment) HPI   Regina Kim is a 62 y.o.female with a significant PMH of diabetes, GERD, arthritis, angina, MI, asthma and UTI presents to the ER with complaints of fall at work today. She was moving some boxes when she tripped over another box. She was able to turn quickly enough to fall forward. She scrapped bilateral knees and left forearm. She also landed on her left hip. She denies having hip pain or limping while ambulating. Her knees feel as though they are burning where the abrasions are. Her forearm also does not hurt. The incident happened this morning and the company required she go to the ED for evaluation. The patient did not feel as though an ER visit was necessary. Denies loc, head or neck injury.  Past Medical History  Diagnosis Date  . Diabetes mellitus     dx'd 4 yrs ago-NIDDM  . GERD (gastroesophageal reflux disease)   . Arthritis   . SUI (stress urinary incontinence, female) 05/08/2011  . 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 PONV (postoperative nausea and vomiting)   . Angina     history of angina- none in 5 yrs  . Myocardial infarction 2005    no blockage found in heart cath  . Asthma     years ago & related to GERD  . UTI (urinary tract infection)   . Urine protein increased    Past Surgical History  Procedure Laterality Date  . Cardiac catheterization    . Cystoscopy    . Abdominal hysterectomy    . Back surgery    . Bladder surgery  05/08/11    Bladder Sling  . Bladder surgery  05/08/11    Bladder Sling  . Bladder suspension  05/08/2011    Procedure: TRANSVAGINAL TAPE (TVT) PROCEDURE;  Surgeon:  Roselle Locus II;  Location: [facility];  Service: Gynecology;  Laterality: N/A;  Transobturator Tape With Cystoscopy  . Total hip arthroplasty  03/31/2012    Procedure: TOTAL HIP ARTHROPLASTY;  Surgeon: Nestor Lewandowsky, MD;  Location: [facility] OR;  Service: Orthopedics;  Laterality: Right;   No family history on file. History  Substance Use Topics  . Smoking status: Former Smoker -- 1.00 packs/day for 30 years    Types: Cigarettes    Quit date: 03/23/2004  . Smokeless tobacco: Not on file  . Alcohol Use: 0.6 oz/week    1 Glasses of wine per week   OB History   Grav Para Term Preterm Abortions TAB SAB Ect Mult Living   5 5 5       5      Review of Systems ROS is negative unless otherwise stated in the HPI  Allergies  Nickel and Vicodin  Home Medications   Current Outpatient Rx  Name  Route  Sig  Dispense  Refill  . cetirizine (ZYRTEC) 10 MG tablet   Oral   Take 10 mg by mouth daily.         Marland Kitchen lisinopril (PRINIVIL,ZESTRIL) 10 MG tablet   Oral   Take 10 mg by mouth daily.          Marland Kitchen  meloxicam (MOBIC) 15 MG tablet   Oral   Take 15 mg by mouth at bedtime.         . metFORMIN (GLUCOPHAGE) 1000 MG tablet   Oral   Take 1,000 mg by mouth 2 (two) times daily with a meal.         . methocarbamol (ROBAXIN) 500 MG tablet   Oral   Take 500 mg by mouth at bedtime.         Marland Kitchen omeprazole (PRILOSEC) 20 MG capsule   Oral   Take 20 mg by mouth at bedtime.         . rosuvastatin (CRESTOR) 10 MG tablet   Oral   Take 10 mg by mouth daily.            BP 155/88  Pulse 93  Temp(Src) 98.3 F (36.8 C) (Oral)  SpO2 97% Physical Exam  Nursing note and vitals reviewed. Constitutional: She appears well-developed and well-nourished. No distress.  HENT:  Head: Normocephalic and atraumatic.   HEENT: Anicteric.  No pallor.  No discharge from ears, eyes, nose, or mouth.   Eyes: Pupils are equal, round, and reactive to light.  Neck: Normal range of motion. Neck supple.    Cardiovascular: Normal rate and regular rhythm.   Pulmonary/Chest: Effort normal.  Abdominal: Soft.  Musculoskeletal:       Left hip: Normal.       Right knee: She exhibits normal range of motion, no swelling, no effusion and no laceration. No tenderness found.       Left knee: She exhibits effusion. She exhibits normal range of motion and no swelling. Tenderness found. Medial joint line and lateral joint line tenderness noted.       Left forearm: She exhibits no tenderness, no bony tenderness, no swelling, no edema, no deformity and no laceration.       Arms:      Legs: Neurological: She is alert.  Skin: Skin is warm and dry.    ED Course   Procedures (including critical care time)  Labs Reviewed - No data to display Dg Knee Complete 4 Views Left  06/08/2013   *RADIOLOGY REPORT*  Clinical Data: Bilateral knee pain post fall  LEFT KNEE - COMPLETE 4+ VIEW  Comparison: None  Findings: Bone mineralization normal. Joint spaces preserved. No fracture, dislocation, or bone destruction. No joint effusion.  IMPRESSION: Normal exam.   Original Report Authenticated By: Ulyses Southward, M.D.   1. Slip/trip w/o falling due to stepping on object, init   2. Multiple abrasions   3. Knee pain, left     MDM  Patients xray is negative. Physical exam is not concerning for any acute concerns.  She denies wanting pain medication. Referral to Ortho given.  62 y.o.Regina Kim's evaluation in the Emergency Department is complete. It has been determined that no acute conditions requiring further emergency intervention are present at this time. The patient/guardian have been advised of the diagnosis and plan. We have discussed signs and symptoms that warrant return to the ED, such as changes or worsening in symptoms.  Vital signs are stable at discharge. Filed Vitals:   06/08/13 0854  BP: 155/88  Pulse: 93  Temp: 98.3 F (36.8 C)    Patient/guardian has voiced understanding and agreed to  follow-up with the PCP or specialist.   Dorthula Matas, PA-C 06/08/13 1054

## 2013-06-08 NOTE — ED Provider Notes (Signed)
Medical screening examination/treatment/procedure(s) were performed by non-physician practitioner and as supervising physician I was immediately available for consultation/collaboration.  Naelani Lafrance M Waneta Fitting, MD 06/08/13 1539 

## 2013-06-08 NOTE — ED Notes (Signed)
Pt states that she tripped over a box at work ([employer]).  States that she fell onto her knees.  C/o bilateral knee pain, lt hip pain, lt arm pain.

## 2013-08-25 ENCOUNTER — Other Ambulatory Visit: Payer: Self-pay

## 2014-06-05 ENCOUNTER — Ambulatory Visit (HOSPITAL_BASED_OUTPATIENT_CLINIC_OR_DEPARTMENT_OTHER)
Admission: RE | Admit: 2014-06-05 | Discharge: 2014-06-05 | Disposition: A | Discharge status: Home / Self Care | Payer: 59 | Source: Ambulatory Visit | Attending: Family Medicine | Admitting: Family Medicine

## 2014-06-05 ENCOUNTER — Other Ambulatory Visit (HOSPITAL_BASED_OUTPATIENT_CLINIC_OR_DEPARTMENT_OTHER): Payer: Self-pay | Admitting: Family Medicine

## 2014-06-05 ENCOUNTER — Encounter (HOSPITAL_BASED_OUTPATIENT_CLINIC_OR_DEPARTMENT_OTHER): Payer: Self-pay

## 2014-06-05 DIAGNOSIS — R109 Unspecified abdominal pain: Secondary | ICD-10-CM

## 2014-06-05 DIAGNOSIS — K5730 Diverticulosis of large intestine without perforation or abscess without bleeding: Secondary | ICD-10-CM | POA: Diagnosis not present

## 2014-06-05 DIAGNOSIS — K7689 Other specified diseases of liver: Secondary | ICD-10-CM | POA: Insufficient documentation

## 2014-06-05 MED ORDER — IOHEXOL 300 MG/ML  SOLN
100.0000 mL | Freq: Once | INTRAMUSCULAR | Status: AC | PRN
  Administered 2014-06-05: 100 mL via INTRAVENOUS

## 2014-06-05 NOTE — Discharge Instructions (Signed)
° ° °  Outpatient Metformin Instructions °(Glucophage, Glucovance, Fortamet, Riomet, Metaglip, Glumetza, Actoplus met  Avandamet, Janumet) ° ° °Patient: Regina Kim                                                06/05/2014: ° ° ° °Radiology Exam:   ° ° °As part of your exam today in the Radiology Department, you were given a radiographic contrast material or x-ray dye. ° °Because you have had this contrast material and you are taking a Metformin drug (Glucophage, Glucovance, Avandamet, Fortamet, Riomet, Metaglip, Glumetza, Actoplus met, Actoplus Met XR, Prandimet or Janumet), please observe the following instructions: ° °· DO NOT  Take this medication for 48 hours after your exam. °· Because you have normal renal function and have no comorbidities, you may restart your medication in 48 hours with no need for a renal function test or consultation with your physician. °· You have normal renal function but have some comorbidities.  Comorbidities include liver disease, alcohol overuse, heart failure, myocardial or muscular ischemia, sepsis, or other severe infection.  Therefore you should consult your physician before restarting your medication. °· You have impaired renal function.  You should consult your physician before restarting your medication and you are advised to get a renal function test before restarting your medication.  Please discuss this with your physician. ° °· Call your doctor before you start taking this medication again.  Your doctor may want to check your kidney function before you start taking this medication again. ° °I understand these instructions and have had an opportunity to discuss them with Radiology Department personnel. ° ° ° ° ° ° °

## 2014-08-21 ENCOUNTER — Encounter (HOSPITAL_BASED_OUTPATIENT_CLINIC_OR_DEPARTMENT_OTHER): Payer: Self-pay

## 2014-09-28 ENCOUNTER — Other Ambulatory Visit (HOSPITAL_COMMUNITY): Payer: Self-pay | Admitting: Family Medicine

## 2014-09-28 DIAGNOSIS — Z1231 Encounter for screening mammogram for malignant neoplasm of breast: Secondary | ICD-10-CM

## 2014-10-02 ENCOUNTER — Ambulatory Visit (HOSPITAL_COMMUNITY): Payer: 59

## 2014-12-04 ENCOUNTER — Encounter: Payer: Self-pay | Admitting: Gastroenterology

## 2015-02-09 ENCOUNTER — Encounter: Payer: Self-pay | Admitting: Gastroenterology

## 2015-02-09 ENCOUNTER — Other Ambulatory Visit (HOSPITAL_COMMUNITY): Payer: Self-pay | Admitting: Family Medicine

## 2015-02-09 DIAGNOSIS — Z1231 Encounter for screening mammogram for malignant neoplasm of breast: Secondary | ICD-10-CM

## 2015-02-12 ENCOUNTER — Ambulatory Visit (HOSPITAL_COMMUNITY)
Admission: RE | Admit: 2015-02-12 | Discharge: 2015-02-12 | Disposition: A | Discharge status: Home / Self Care | Payer: 59 | Source: Ambulatory Visit | Attending: Family Medicine | Admitting: Family Medicine

## 2015-02-12 DIAGNOSIS — Z1231 Encounter for screening mammogram for malignant neoplasm of breast: Secondary | ICD-10-CM | POA: Diagnosis not present

## 2015-03-17 ENCOUNTER — Emergency Department (HOSPITAL_BASED_OUTPATIENT_CLINIC_OR_DEPARTMENT_OTHER): Payer: 59

## 2015-03-17 ENCOUNTER — Emergency Department (HOSPITAL_BASED_OUTPATIENT_CLINIC_OR_DEPARTMENT_OTHER)
Admission: EM | Admit: 2015-03-17 | Discharge: 2015-03-17 | Disposition: A | Discharge status: Home / Self Care | Payer: 59 | Attending: Emergency Medicine | Admitting: Emergency Medicine

## 2015-03-17 ENCOUNTER — Encounter (HOSPITAL_BASED_OUTPATIENT_CLINIC_OR_DEPARTMENT_OTHER): Payer: Self-pay

## 2015-03-17 DIAGNOSIS — I252 Old myocardial infarction: Secondary | ICD-10-CM | POA: Diagnosis not present

## 2015-03-17 DIAGNOSIS — Z9889 Other specified postprocedural states: Secondary | ICD-10-CM | POA: Insufficient documentation

## 2015-03-17 DIAGNOSIS — E119 Type 2 diabetes mellitus without complications: Secondary | ICD-10-CM | POA: Insufficient documentation

## 2015-03-17 DIAGNOSIS — R109 Unspecified abdominal pain: Secondary | ICD-10-CM

## 2015-03-17 DIAGNOSIS — Z87891 Personal history of nicotine dependence: Secondary | ICD-10-CM | POA: Diagnosis not present

## 2015-03-17 DIAGNOSIS — Z791 Long term (current) use of non-steroidal anti-inflammatories (NSAID): Secondary | ICD-10-CM | POA: Diagnosis not present

## 2015-03-17 DIAGNOSIS — J45909 Unspecified asthma, uncomplicated: Secondary | ICD-10-CM | POA: Diagnosis not present

## 2015-03-17 DIAGNOSIS — Z87440 Personal history of urinary (tract) infections: Secondary | ICD-10-CM | POA: Insufficient documentation

## 2015-03-17 DIAGNOSIS — K219 Gastro-esophageal reflux disease without esophagitis: Secondary | ICD-10-CM | POA: Insufficient documentation

## 2015-03-17 DIAGNOSIS — M1990 Unspecified osteoarthritis, unspecified site: Secondary | ICD-10-CM | POA: Diagnosis not present

## 2015-03-17 DIAGNOSIS — R8271 Bacteriuria: Secondary | ICD-10-CM

## 2015-03-17 DIAGNOSIS — Z79899 Other long term (current) drug therapy: Secondary | ICD-10-CM | POA: Diagnosis not present

## 2015-03-17 DIAGNOSIS — N390 Urinary tract infection, site not specified: Secondary | ICD-10-CM | POA: Insufficient documentation

## 2015-03-17 LAB — CBC
HCT: 39.4 % (ref 36.0–46.0)
Hemoglobin: 12.9 g/dL (ref 12.0–15.0)
MCH: 28.7 pg (ref 26.0–34.0)
MCHC: 32.7 g/dL (ref 30.0–36.0)
MCV: 87.6 fL (ref 78.0–100.0)
Platelets: 233 10*3/uL (ref 150–400)
RBC: 4.5 MIL/uL (ref 3.87–5.11)
RDW: 13.7 % (ref 11.5–15.5)
WBC: 5.1 10*3/uL (ref 4.0–10.5)

## 2015-03-17 LAB — COMPREHENSIVE METABOLIC PANEL
ALBUMIN: 4.1 g/dL (ref 3.5–5.0)
ALT: 19 U/L (ref 14–54)
ANION GAP: 10 (ref 5–15)
AST: 24 U/L (ref 15–41)
Alkaline Phosphatase: 91 U/L (ref 38–126)
BUN: 20 mg/dL (ref 6–20)
CALCIUM: 9.1 mg/dL (ref 8.9–10.3)
CO2: 22 mmol/L (ref 22–32)
Chloride: 105 mmol/L (ref 101–111)
Creatinine, Ser: 0.82 mg/dL (ref 0.44–1.00)
GFR calc non Af Amer: >60 mL/min (ref >60)
GLUCOSE: 133 mg/dL — AB (ref 65–99)
Potassium: 4.3 mmol/L (ref 3.5–5.1)
SODIUM: 137 mmol/L (ref 135–145)
TOTAL PROTEIN: 7.4 g/dL (ref 6.5–8.1)
Total Bilirubin: 0.8 mg/dL (ref 0.3–1.2)

## 2015-03-17 LAB — URINE MICROSCOPIC-ADD ON

## 2015-03-17 LAB — URINALYSIS, ROUTINE W REFLEX MICROSCOPIC
BILIRUBIN URINE: NEGATIVE
Glucose, UA: NEGATIVE mg/dL
HGB URINE DIPSTICK: NEGATIVE
KETONES UR: NEGATIVE mg/dL
Nitrite: NEGATIVE
PROTEIN: 100 mg/dL — AB
Specific Gravity, Urine: 1.015 (ref 1.005–1.030)
UROBILINOGEN UA: 0.2 mg/dL (ref 0.0–1.0)
pH: 5.5 (ref 5.0–8.0)

## 2015-03-17 LAB — LIPASE, BLOOD: Lipase: 34 U/L (ref 22–51)

## 2015-03-17 MED ORDER — TRAMADOL HCL 50 MG PO TABS: 50.0000 mg | ORAL_TABLET | Freq: Four times a day (QID) | ORAL | Status: DC | PRN

## 2015-03-17 MED ORDER — MORPHINE SULFATE 4 MG/ML IJ SOLN
4.0000 mg | Freq: Once | INTRAMUSCULAR | Status: AC
  Administered 2015-03-17: 4 mg via INTRAVENOUS
  Filled 2015-03-17: qty 1

## 2015-03-17 MED ORDER — CEPHALEXIN 500 MG PO CAPS: 500.0000 mg | ORAL_CAPSULE | Freq: Four times a day (QID) | ORAL | Status: DC

## 2015-03-17 NOTE — ED Notes (Signed)
Patient here with RLQ pain x 3 days. Reports that the pain started on left side and today moved to right. Denies nausea and vomiting. [facility]

## 2015-03-17 NOTE — ED Provider Notes (Signed)
CSN: [id number]     Arrival date & time 03/17/15  1346 History   First MD Initiated Contact with Patient 03/17/15 1358     Chief Complaint  Patient presents with  . Abdominal Pain     (Consider location/radiation/quality/duration/timing/severity/associated sxs/prior Treatment) HPI Comments: 64 year old female complaining of bilateral flank pain 2 days. Reports initially, she experienced a sharp, severe pain in her left flank, worse when she moved her arm to put seatbelt on, but she had pulled a muscle, however the pain went away on its own. Later that evening, the pain returned while she was laying in bed, described as a sharp, intense pain. Yesterday, she no longer had the pain on the left, and started to experience the same symptoms on the right, described as a knife stabbing her beneath her right ribs in her abdomen. Pain has been intermittent, worse when laying flat. Denies ever having pain like this in the past. Denies any other symptoms. Denies fever, chills, nausea, vomiting, chest pain, shortness of breath, urinary symptoms, GYN complaints or diarrhea. Last bowel movement was 2 days ago. Denies any history of kidney stones. Pain does not have any relation to food.  Patient is a 64 y.o. female presenting with abdominal pain. The history is provided by the patient and the spouse.  Abdominal Pain   Past Medical History  Diagnosis Date  . Diabetes mellitus     dx'd 4 yrs ago-NIDDM  . GERD (gastroesophageal reflux disease)   . Arthritis   . SUI (stress urinary incontinence, female) 05/08/2011  . 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 PONV (postoperative nausea and vomiting)   . Angina     history of angina- none in 5 yrs  . Myocardial infarction 2005    no blockage found in heart cath  . Asthma     years ago & related to GERD  . UTI (urinary tract infection)   . Urine protein increased    Past Surgical History  Procedure  Laterality Date  . Cardiac catheterization    . Cystoscopy    . Abdominal hysterectomy    . Back surgery    . Bladder surgery  05/08/11    Bladder Sling  . Bladder surgery  05/08/11    Bladder Sling  . Bladder suspension  05/08/2011    Procedure: TRANSVAGINAL TAPE (TVT) PROCEDURE;  Surgeon: Roselle LocusJames E Tomblin II;  Location: [facility];  Service: Gynecology;  Laterality: N/A;  Transobturator Tape With Cystoscopy  . Total hip arthroplasty  03/31/2012    Procedure: TOTAL HIP ARTHROPLASTY;  Surgeon: Nestor LewandowskyFrank J Rowan, MD;  Location: [facility] OR;  Service: Orthopedics;  Laterality: Right;   No family history on file. History  Substance Use Topics  . Smoking status: Former Smoker -- 1.00 packs/day for 30 years    Types: Cigarettes    Quit date: 03/23/2004  . Smokeless tobacco: Not on file  . Alcohol Use: 0.6 oz/week    1 Glasses of wine per week   OB History    Gravida Para Term Preterm AB TAB SAB Ectopic Multiple Living   5 5 5       5      Review of Systems  Gastrointestinal: Positive for abdominal pain.  Genitourinary: Positive for flank pain.  All other systems reviewed and are negative.     Allergies  Nickel and Vicodin  Home Medications   Prior to Admission medications  Medication Sig Start Date End Date Taking? Authorizing Provider  Albiglutide 30 MG PEN Inject into the skin.   Yes Historical Provider, MD  atorvastatin (LIPITOR) 10 MG tablet Take 10 mg by mouth daily.   Yes Historical Provider, MD  cephALEXin (KEFLEX) 500 MG capsule Take 1 capsule (500 mg total) by mouth 4 (four) times daily. 03/17/15   Kathrynn Speed, PA-C  cetirizine (ZYRTEC) 10 MG tablet Take 10 mg by mouth daily.    Historical Provider, MD  lisinopril (PRINIVIL,ZESTRIL) 10 MG tablet Take 10 mg by mouth daily.     Historical Provider, MD  meloxicam (MOBIC) 15 MG tablet Take 15 mg by mouth at bedtime.    Historical Provider, MD  metFORMIN (GLUCOPHAGE) 1000 MG tablet Take 1,000 mg by mouth 2 (two) times daily with a meal.     Historical Provider, MD  methocarbamol (ROBAXIN) 500 MG tablet Take 500 mg by mouth at bedtime.    Historical Provider, MD  omeprazole (PRILOSEC) 20 MG capsule Take 20 mg by mouth at bedtime.    Historical Provider, MD  traMADol (ULTRAM) 50 MG tablet Take 1 tablet (50 mg total) by mouth every 6 (six) hours as needed. 03/17/15   Tanyon Alipio M Chemeka Filice, PA-C   BP 122/84 mmHg  Pulse 93  Temp(Src) 98.4 F (36.9 C) (Oral)  Resp 18  Wt 194 lb (87.998 kg)  SpO2 99% Physical Exam  Constitutional: She is oriented to person, place, and time. She appears well-developed and well-nourished. No distress.  HENT:  Head: Normocephalic and atraumatic.  Mouth/Throat: Oropharynx is clear and moist.  Eyes: Conjunctivae and EOM are normal.  Neck: Normal range of motion. Neck supple.  Cardiovascular: Normal rate, regular rhythm and normal heart sounds.   Pulmonary/Chest: Effort normal and breath sounds normal. No respiratory distress.  Abdominal: Soft. Normal appearance and bowel sounds are normal. She exhibits no distension. There is tenderness in the right upper quadrant and left lower quadrant. There is CVA tenderness (left). There is no rigidity, no rebound, no guarding, no tenderness at McBurney's point and negative Murphy's sign.  Musculoskeletal: Normal range of motion. She exhibits no edema.  Neurological: She is alert and oriented to person, place, and time. No sensory deficit.  Skin: Skin is warm and dry.  Psychiatric: She has a normal mood and affect. Her behavior is normal.  Nursing note and vitals reviewed.   ED Course  Procedures (including critical care time) Labs Review Labs Reviewed  URINALYSIS, ROUTINE W REFLEX MICROSCOPIC (NOT AT [facility]) - Abnormal; Notable for the following:    APPearance CLOUDY (*)    Protein, ur 100 (*)    Leukocytes, UA TRACE (*)    All other components within normal limits  COMPREHENSIVE METABOLIC PANEL - Abnormal; Notable for the following:    Glucose, Bld 133 (*)     All other components within normal limits  URINE MICROSCOPIC-ADD ON - Abnormal; Notable for the following:    Squamous Epithelial / LPF FEW (*)    Bacteria, UA MANY (*)    All other components within normal limits  URINE CULTURE  CBC  LIPASE, BLOOD    Imaging Review Ct Renal Stone Study  03/17/2015   CLINICAL DATA:  Left flank pain for 3 days, with right lower quadrant pain beginning this morning, history of appendectomy  EXAM: CT ABDOMEN AND PELVIS WITHOUT CONTRAST  TECHNIQUE: Multidetector CT imaging of the abdomen and pelvis was performed following the standard protocol without IV contrast.  COMPARISON:  06/05/2014  FINDINGS: No pleural or pericardial effusion. Visualized portions of the lung bases clear except for 5 mm pulmonary nodule anteriorly laterally on the right.  Hepatic steatosis. Gallbladder and spleen appear normal. Pancreas is normal.  Mild fullness of the left adrenal gland stable. Right adrenal gland normal. Mild stable chronic appearing bilateral perinephric inflammatory change. Left kidney is normal with no stones or hydronephrosis. Right kidney appears similarly normal, with some limitation in evaluating the ureteral vesicle junction on the right due to beam artifact from right hip replacement.  No significant adenopathy. No ascites. Aortoiliac vessels show no acute findings.  Mild wall thickening of the cecum. No surrounding inflammatory change. Stool throughout the colon. Diverticulosis of moderate severity involving the sigmoid colon. Small bowel and stomach normal. Reproductive organs not identified.  Musculoskeletal findings.  IMPRESSION: 1. Pulmonary nodule on the right. This is stable from 06/05/2014. If the patient is at low risk for bronchogenic carcinoma this can be considered benign. If the patient is at high risk then follow-up in 6-12 months would be recommended with CT scan. This recommendation follows the consensus statement: Guidelines for Management of Small  Pulmonary Nodules Detected on CT Scans: A Statement from the Fleischner Society as published in Radiology 2005;237:395-400. 2. Mild wall thickening cecum without surrounding inflammatory change. Evaluation limited by stool and absence of contrast. Would suggest nonemergent evaluation with either barium enema or colonoscopy. 3. Sigmoid colon diverticulosis 4. No nephroureterolithiasis or hydroureteronephrosis. Bilateral chronic appearing perinephric inflammatory change.   Electronically Signed   By: Esperanza Heir M.D.   On: 03/17/2015 15:41     EKG Interpretation None      MDM   Final diagnoses:  Bacteriuria  Bilateral flank pain   Nontoxic appearing, NAD. Afebrile. Vital signs stable. Tenderness in right upper and left lower quadrant without guarding or peritoneal signs. Left-sided CVA tenderness. Initial concern for kidney stone. Labs normal. Unlikely gallbladder issue. Negative Murphy's sign. UA with only few squamous epithelial cells, however many bacteria, 7-10 white cells. CT with results as stated above. Discussed the pulmonary nodule patient and she was not aware of this. Patient aware of diverticulosis and has a colonoscopy scheduled for June 10. Otherwise no acute findings. There is no associated fever, nausea, vomiting, chest pain or shortness of breath. Given the flank pain and bacteriuria, will treat with Keflex. Advised follow-up with PCP early next week. Tramadol for pain as she states she does not develop narcotic medication. Pain controlled in the ED, remains in NAD. Repeat exam with CVAT, no other tenderness. Stable for discharge. Return precautions given. Patient states understanding of treatment care plan and is agreeable.  Kathrynn Speed, PA-C 03/17/15 1610  Rolland Porter, MD 03/24/15 [phone]

## 2015-03-17 NOTE — Discharge Instructions (Signed)
Take antibiotic to completion. Take tramadol as directed for pain. Follow up with your primary care doctor.  Flank Pain Flank pain refers to pain that is located on the side of the body between the upper abdomen and the back. The pain may occur over a short period of time (acute) or may be long-term or reoccurring (chronic). It may be mild or severe. Flank pain can be caused by many things. CAUSES  Some of the more common causes of flank pain include:  Muscle strains.   Muscle spasms.   A disease of your spine (vertebral disk disease).   A lung infection (pneumonia).   Fluid around your lungs (pulmonary edema).   A kidney infection.   Kidney stones.   A very painful skin rash caused by the chickenpox virus (shingles).   Gallbladder disease.  HOME CARE INSTRUCTIONS  Home care will depend on the cause of your pain. In general,  Rest as directed by your caregiver.  Drink enough fluids to keep your urine clear or pale yellow.  Only take over-the-counter or prescription medicines as directed by your caregiver. Some medicines may help relieve the pain.  Tell your caregiver about any changes in your pain.  Follow up with your caregiver as directed. SEEK IMMEDIATE MEDICAL CARE IF:   Your pain is not controlled with medicine.   You have new or worsening symptoms.  Your pain increases.   You have abdominal pain.   You have shortness of breath.   You have persistent nausea or vomiting.   You have swelling in your abdomen.   You feel faint or pass out.   You have blood in your urine.  You have a fever or persistent symptoms for more than 2-3 days.  You have a fever and your symptoms suddenly get worse. MAKE SURE YOU:   Understand these instructions.  Will watch your condition.  Will get help right away if you are not doing well or get worse. Document Released: 11/27/2005 Document Revised: 06/30/2012 Document Reviewed: 05/20/2012 [facility] Patient  Information 2015 [place], [place]. This information is not intended to replace advice given to you by your health care provider. Make sure you discuss any questions you have with your health care provider.  Asymptomatic Bacteriuria Asymptomatic bacteriuria is the presence of a large number of bacteria in your urine without the usual symptoms of burning or frequent urination. The following conditions increase the risk of asymptomatic bacteriuria:  Diabetes mellitus.  Advanced age.  Pregnancy in the first trimester.  Kidney stones.  Kidney transplants.  Leaky kidney tube valve in young children (reflux). Treatment for this condition is not needed in most people and can lead to other problems such as too much yeast and growth of resistant bacteria. However, some people, such as pregnant women, do need treatment to prevent kidney infection. Asymptomatic bacteriuria in pregnancy is also associated with fetal growth restriction, premature labor, and newborn death. HOME CARE INSTRUCTIONS Monitor your condition for any changes. The following actions may help to relieve any discomfort you are feeling:  Drink enough water and fluids to keep your urine clear or pale yellow. Go to the bathroom more often to keep your bladder empty.  Keep the area around your vagina and rectum clean. Wipe yourself from front to back after urinating. SEEK IMMEDIATE MEDICAL CARE IF:  You develop signs of an infection such as:  Burning with urination.  Frequency of voiding.  Back pain.  Fever.  You have blood in the urine.  You develop a fever. MAKE SURE YOU:  Understand these instructions.  Will watch your condition.  Will get help right away if you are not doing well or get worse. Document Released: 10/06/2005 Document Revised: 02/20/2014 Document Reviewed: 03/28/2013 [facility] Patient Information 2015 [place], [place]. This information is not intended to replace advice given to you by your health care  provider. Make sure you discuss any questions you have with your health care provider.

## 2015-03-19 LAB — URINE CULTURE: Colony Count: >=100000

## 2015-03-20 ENCOUNTER — Telehealth (HOSPITAL_BASED_OUTPATIENT_CLINIC_OR_DEPARTMENT_OTHER): Payer: Self-pay | Admitting: Emergency Medicine

## 2015-03-20 NOTE — Telephone Encounter (Signed)
Post ED Visit - Positive Culture Follow-up  Culture report reviewed by antimicrobial stewardship pharmacist: []  Wes Dulaney, Pharm.D., BCPS []  Celedonio MiyamotoJeremy Frens, Pharm.D., BCPS []  Georgina PillionElizabeth Martin, Pharm.D., BCPS []  Rock HallMinh Pham, [address]Pharm[address]D., BCPS, AAHIVP []  Estella HuskMichelle Turner, Pharm.D., BCPS, AAHIVP []  Elder CyphersLorie Poole, [address]Pharm.D., BCPS X Tegan Magsam, Pharm D  Positive urine culture E. coli Treated with cephalexin, organism sensitive to the same and no further patient follow-up is required at this time.  Berle MullMiller, Anthea Udovich 03/20/2015, 2:15 PM

## 2015-03-23 ENCOUNTER — Ambulatory Visit (AMBULATORY_SURGERY_CENTER): Payer: Self-pay | Admitting: *Deleted

## 2015-03-23 VITALS — Ht 66.5 in | Wt 190.2 lb

## 2015-03-23 DIAGNOSIS — Z86010 Personal history of colonic polyps: Secondary | ICD-10-CM

## 2015-03-23 MED ORDER — NA SULFATE-K SULFATE-MG SULF 17.5-3.13-1.6 GM/177ML PO SOLN: 1.0000 | Freq: Once | ORAL | Status: DC

## 2015-03-23 NOTE — Progress Notes (Signed)
No diet pills No home 02 use No allergy to eggs or soy 30 years ago pt had an episode with a surgery that caused  respiratory difficulty due to reflux induced asthma, gerd under control, no asthma issues now per pt.  Hip replacement had N/V post op but no other issues  emmi declined

## 2015-04-02 ENCOUNTER — Encounter: Payer: Self-pay | Admitting: Gastroenterology

## 2015-04-02 ENCOUNTER — Ambulatory Visit (AMBULATORY_SURGERY_CENTER): Payer: 59 | Admitting: Gastroenterology

## 2015-04-02 VITALS — BP 121/87 | HR 73 | Temp 96.9°F | Resp 28 | Ht 66.0 in | Wt 190.0 lb

## 2015-04-02 DIAGNOSIS — Z86010 Personal history of colonic polyps: Secondary | ICD-10-CM

## 2015-04-02 DIAGNOSIS — K5730 Diverticulosis of large intestine without perforation or abscess without bleeding: Secondary | ICD-10-CM

## 2015-04-02 LAB — GLUCOSE, CAPILLARY
GLUCOSE-CAPILLARY: 135 mg/dL — AB (ref 65–99)
GLUCOSE-CAPILLARY: 132 mg/dL — AB (ref 65–99)

## 2015-04-02 MED ORDER — SODIUM CHLORIDE 0.9 % IV SOLN: 500.0000 mL | INTRAVENOUS | Status: DC

## 2015-04-02 NOTE — Op Note (Signed)
[facility] [address] [place], [address]   COLONOSCOPY PROCEDURE REPORT  PATIENT: Regina Kim, Regina Kim  MR#: [id number] BIRTHDATE: 1951-06-19 , 63  yrs. old GENDER: female ENDOSCOPIST: Louis Meckel, MD REFERRED BY: PROCEDURE DATE:  04/02/2015 PROCEDURE:   Colonoscopy, surveillance First Screening Colonoscopy - Avg.  risk and is 50 yrs.  old or older - No.  Prior Negative Screening - Now for repeat screening. N/A  History of Adenoma - Now for follow-up colonoscopy & has been > or = to 3 yrs.  Yes hx of adenoma.  Has been 3 or more years since last colonoscopy.  Polyps removed today? No Recommend repeat exam, <10 yrs? No ASA CLASS:   Class II INDICATIONS:PH Colon Adenoma. 2003; 2009 colonoscopy negative for adenomatous polyps MEDICATIONS: Monitored anesthesia care and Propofol 300 mg IV  DESCRIPTION OF PROCEDURE:   After the risks benefits and alternatives of the procedure were thoroughly explained, informed consent was obtained.  The digital rectal exam revealed no abnormalities of the rectum.   The [id number]  endoscope was introduced through the anus and advanced to the ileum. No adverse events experienced.   The quality of the prep was (Suprep was used) excellent.  The instrument was then slowly withdrawn as the colon was fully examined. Estimated blood loss is zero unless otherwise noted in this procedure report.      COLON FINDINGS: There was mild diverticulosis noted in the descending colon and sigmoid colon.   The examination was otherwise normal.  Retroflexed views revealed no abnormalities. The time to cecum = 5.Withdrawal time = 6:17   The scope was withdrawn and the procedure completed. COMPLICATIONS: There were no immediate complications.  ENDOSCOPIC IMPRESSION: 1.   Mild diverticulosis was noted in the descending colon and sigmoid colon 2.   The examination was otherwise normal  RECOMMENDATIONS: Repeat Colonscopy in 10  years.  eSigned:  Louis Meckel, MD 04/02/2015 8:42 AM   cc: Antionette Fairy, MD

## 2015-04-02 NOTE — Progress Notes (Signed)
Patient stating the ED doctor stated to have GI doctor look at CT results from ED admission related to thickening near small intestine.

## 2015-04-02 NOTE — Patient Instructions (Signed)
Discharge instructions given. Handouts on diverticulosis and a high fiber diet Resume previous medications. YOU HAD AN ENDOSCOPIC PROCEDURE TODAY AT THE [facility]:   Refer to the procedure report that was given to you for any specific questions about what was found during the examination.  If the procedure report does not answer your questions, please call your gastroenterologist to clarify.  If you requested that your care partner not be given the details of your procedure findings, then the procedure report has been included in a sealed envelope for you to review at your convenience later.  YOU SHOULD EXPECT: Some feelings of bloating in the abdomen. Passage of more gas than usual.  Walking can help get rid of the air that was put into your GI tract during the procedure and reduce the bloating. If you had a lower endoscopy (such as a colonoscopy or flexible sigmoidoscopy) you may notice spotting of blood in your stool or on the toilet paper. If you underwent a bowel prep for your procedure, you may not have a normal bowel movement for a few days.  Please Note:  You might notice some irritation and congestion in your nose or some drainage.  This is from the oxygen used during your procedure.  There is no need for concern and it should clear up in a day or so.  SYMPTOMS TO REPORT IMMEDIATELY:   Following lower endoscopy (colonoscopy or flexible sigmoidoscopy):  Excessive amounts of blood in the stool  Significant tenderness or worsening of abdominal pains  Swelling of the abdomen that is new, acute  Fever of 100F or higher   For urgent or emergent issues, a gastroenterologist can be reached at any hour by calling [phone].   DIET: Your first meal following the procedure should be a small meal and then it is ok to progress to your normal diet. Heavy or fried foods are harder to digest and may make you feel nauseous or bloated.  Likewise, meals heavy in dairy and vegetables  can increase bloating.  Drink plenty of fluids but you should avoid alcoholic beverages for 24 hours.  ACTIVITY:  You should plan to take it easy for the rest of today and you should NOT DRIVE or use heavy machinery until tomorrow (because of the sedation medicines used during the test).    FOLLOW UP: Our staff will call the number listed on your records the next business day following your procedure to check on you and address any questions or concerns that you may have regarding the information given to you following your procedure. If we do not reach you, we will leave a message.  However, if you are feeling well and you are not experiencing any problems, there is no need to return our call.  We will assume that you have returned to your regular daily activities without incident.  If any biopsies were taken you will be contacted by phone or by letter within the next 1-3 weeks.  Please call us at [phone] if you have not heard about the biopsies in 3 weeks.    SIGNATURES/CONFIDENTIALITY: You and/or your care partner have signed paperwork which will be entered into your electronic medical record.  These signatures attest to the fact that that the information above on your After Visit Summary has been reviewed and is understood.  Full responsibility of the confidentiality of this discharge information lies with you and/or your care-partner. 

## 2015-04-02 NOTE — Progress Notes (Signed)
A/ox3, pleased with MAC, report to RN 

## 2015-04-03 ENCOUNTER — Telehealth: Payer: Self-pay | Admitting: Emergency Medicine

## 2015-04-03 NOTE — Telephone Encounter (Signed)
  Follow up Call-  Call back number 04/02/2015  Post procedure Call Back phone  # [phone]  Permission to leave phone message Yes     Patient questions:  Do you have a fever, pain , or abdominal swelling? No. Pain Score  0 *  Have you tolerated food without any problems? yes  Have you been able to return to your normal activities? yes  Do you have any questions about your discharge instructions: Diet   No. Medications  No. Follow up visit  No.  Do you have questions or concerns about your Care? no  Actions: * If pain score is 4 or above: No action needed, pain <4.

## 2015-12-27 ENCOUNTER — Other Ambulatory Visit: Payer: Self-pay

## 2015-12-27 ENCOUNTER — Other Ambulatory Visit: Payer: Self-pay | Admitting: Family Medicine

## 2015-12-27 DIAGNOSIS — Z1231 Encounter for screening mammogram for malignant neoplasm of breast: Secondary | ICD-10-CM

## 2015-12-27 DIAGNOSIS — Z780 Asymptomatic menopausal state: Secondary | ICD-10-CM

## 2016-02-14 ENCOUNTER — Ambulatory Visit
Admission: RE | Admit: 2016-02-14 | Discharge: 2016-02-14 | Disposition: A | Discharge status: Home / Self Care | Payer: 59 | Source: Ambulatory Visit

## 2016-02-14 ENCOUNTER — Ambulatory Visit
Admission: RE | Admit: 2016-02-14 | Discharge: 2016-02-14 | Disposition: A | Discharge status: Home / Self Care | Payer: 59 | Source: Ambulatory Visit | Attending: Family Medicine | Admitting: Family Medicine

## 2016-02-14 DIAGNOSIS — Z780 Asymptomatic menopausal state: Secondary | ICD-10-CM

## 2016-02-14 DIAGNOSIS — Z1231 Encounter for screening mammogram for malignant neoplasm of breast: Secondary | ICD-10-CM

## 2016-03-08 ENCOUNTER — Encounter (HOSPITAL_COMMUNITY): Payer: Self-pay | Admitting: Emergency Medicine

## 2016-03-08 ENCOUNTER — Emergency Department (HOSPITAL_COMMUNITY): Payer: Commercial Managed Care - HMO

## 2016-03-08 ENCOUNTER — Emergency Department (HOSPITAL_COMMUNITY)
Admission: EM | Admit: 2016-03-08 | Discharge: 2016-03-09 | Disposition: A | Discharge status: Home / Self Care | Payer: Commercial Managed Care - HMO | Attending: Emergency Medicine | Admitting: Emergency Medicine

## 2016-03-08 ENCOUNTER — Ambulatory Visit (INDEPENDENT_AMBULATORY_CARE_PROVIDER_SITE_OTHER): Payer: Commercial Managed Care - HMO | Admitting: Family Medicine

## 2016-03-08 VITALS — BP 110/78 | HR 90 | Temp 98.2°F | Resp 18 | Ht 66.0 in | Wt 186.0 lb

## 2016-03-08 DIAGNOSIS — E162 Hypoglycemia, unspecified: Secondary | ICD-10-CM | POA: Insufficient documentation

## 2016-03-08 DIAGNOSIS — Z7982 Long term (current) use of aspirin: Secondary | ICD-10-CM | POA: Diagnosis not present

## 2016-03-08 DIAGNOSIS — K5730 Diverticulosis of large intestine without perforation or abscess without bleeding: Secondary | ICD-10-CM | POA: Insufficient documentation

## 2016-03-08 DIAGNOSIS — M1990 Unspecified osteoarthritis, unspecified site: Secondary | ICD-10-CM | POA: Diagnosis not present

## 2016-03-08 DIAGNOSIS — R109 Unspecified abdominal pain: Secondary | ICD-10-CM

## 2016-03-08 DIAGNOSIS — J45909 Unspecified asthma, uncomplicated: Secondary | ICD-10-CM | POA: Diagnosis not present

## 2016-03-08 DIAGNOSIS — R1031 Right lower quadrant pain: Secondary | ICD-10-CM

## 2016-03-08 DIAGNOSIS — E119 Type 2 diabetes mellitus without complications: Secondary | ICD-10-CM | POA: Insufficient documentation

## 2016-03-08 DIAGNOSIS — N12 Tubulo-interstitial nephritis, not specified as acute or chronic: Secondary | ICD-10-CM | POA: Diagnosis not present

## 2016-03-08 DIAGNOSIS — Z87891 Personal history of nicotine dependence: Secondary | ICD-10-CM | POA: Diagnosis not present

## 2016-03-08 DIAGNOSIS — Z7984 Long term (current) use of oral hypoglycemic drugs: Secondary | ICD-10-CM | POA: Insufficient documentation

## 2016-03-08 DIAGNOSIS — K219 Gastro-esophageal reflux disease without esophagitis: Secondary | ICD-10-CM | POA: Diagnosis not present

## 2016-03-08 DIAGNOSIS — N390 Urinary tract infection, site not specified: Secondary | ICD-10-CM

## 2016-03-08 DIAGNOSIS — E785 Hyperlipidemia, unspecified: Secondary | ICD-10-CM | POA: Insufficient documentation

## 2016-03-08 DIAGNOSIS — N179 Acute kidney failure, unspecified: Secondary | ICD-10-CM

## 2016-03-08 LAB — POCT URINALYSIS DIP (MANUAL ENTRY)
Bilirubin, UA: NEGATIVE
Glucose, UA: NEGATIVE
Ketones, POC UA: NEGATIVE
Leukocytes, UA: NEGATIVE
NITRITE UA: POSITIVE — AB
PH UA: 5.5
Protein Ur, POC: 100 — AB
RBC UA: NEGATIVE
Spec Grav, UA: 1.02
Urobilinogen, UA: 0.2

## 2016-03-08 LAB — COMPREHENSIVE METABOLIC PANEL
ALBUMIN: 4.7 g/dL (ref 3.5–5.0)
ALT: 21 U/L (ref 14–54)
ANION GAP: 9 (ref 5–15)
AST: 26 U/L (ref 15–41)
Alkaline Phosphatase: 76 U/L (ref 38–126)
BILIRUBIN TOTAL: 0.6 mg/dL (ref 0.3–1.2)
BUN: 22 mg/dL — ABNORMAL HIGH (ref 6–20)
CHLORIDE: 108 mmol/L (ref 101–111)
CO2: 22 mmol/L (ref 22–32)
Calcium: 9.5 mg/dL (ref 8.9–10.3)
Creatinine, Ser: 1.04 mg/dL — ABNORMAL HIGH (ref 0.44–1.00)
GFR calc Af Amer: >60 mL/min (ref >60)
GFR calc non Af Amer: 56 mL/min — ABNORMAL LOW (ref >60)
GLUCOSE: 63 mg/dL — AB (ref 65–99)
POTASSIUM: 4.5 mmol/L (ref 3.5–5.1)
SODIUM: 139 mmol/L (ref 135–145)
TOTAL PROTEIN: 7.6 g/dL (ref 6.5–8.1)

## 2016-03-08 LAB — URINE MICROSCOPIC-ADD ON

## 2016-03-08 LAB — CBC
HEMATOCRIT: 40.3 % (ref 36.0–46.0)
HEMOGLOBIN: 13.2 g/dL (ref 12.0–15.0)
MCH: 29.3 pg (ref 26.0–34.0)
MCHC: 32.8 g/dL (ref 30.0–36.0)
MCV: 89.4 fL (ref 78.0–100.0)
Platelets: 222 10*3/uL (ref 150–400)
RBC: 4.51 MIL/uL (ref 3.87–5.11)
RDW: 13.9 % (ref 11.5–15.5)
WBC: 6.7 10*3/uL (ref 4.0–10.5)

## 2016-03-08 LAB — URINALYSIS, ROUTINE W REFLEX MICROSCOPIC
BILIRUBIN URINE: NEGATIVE
Glucose, UA: NEGATIVE mg/dL
Hgb urine dipstick: NEGATIVE
Ketones, ur: NEGATIVE mg/dL
NITRITE: POSITIVE — AB
PH: 5.5 (ref 5.0–8.0)
Protein, ur: 100 mg/dL — AB
SPECIFIC GRAVITY, URINE: 1.024 (ref 1.005–1.030)

## 2016-03-08 LAB — LIPASE, BLOOD: LIPASE: 25 U/L (ref 11–51)

## 2016-03-08 LAB — CBG MONITORING, ED: GLUCOSE-CAPILLARY: 104 mg/dL — AB (ref 65–99)

## 2016-03-08 MED ORDER — CEPHALEXIN 500 MG PO CAPS: 1000.0000 mg | ORAL_CAPSULE | Freq: Two times a day (BID) | ORAL | Status: AC

## 2016-03-08 MED ORDER — SODIUM CHLORIDE 0.9 % IV BOLUS (SEPSIS)
1000.0000 mL | Freq: Once | INTRAVENOUS | Status: AC
Start: 2016-03-08 — End: 2016-03-08
  Administered 2016-03-08: 1000 mL via INTRAVENOUS

## 2016-03-08 MED ORDER — CEFTRIAXONE SODIUM 1 G IJ SOLR
1.0000 g | Freq: Once | INTRAMUSCULAR | Status: AC
  Administered 2016-03-08: 1 g via INTRAVENOUS
  Filled 2016-03-08: qty 10

## 2016-03-08 MED ORDER — MORPHINE SULFATE (PF) 4 MG/ML IV SOLN
4.0000 mg | Freq: Once | INTRAVENOUS | Status: DC
  Filled 2016-03-08: qty 1

## 2016-03-08 NOTE — ED Provider Notes (Signed)
CSN: [id number]     Arrival date & time 03/08/16  1707 History   First MD Initiated Contact with Patient 03/08/16 1840     Chief Complaint  Patient presents with  . Flank Pain     (Consider location/radiation/quality/duration/timing/severity/associated sxs/prior Treatment) HPI Comments: Regina Kim is a 65 y.o. female with a PMHx of DM2, GERD, arthritis, stress urge incontinence s/p bladder sling surgery, chronic angina, asthma, recurrent UTIs, HLD, and cataracts, and a PSHx of bladder sling surgery, abdominal hysterectomy, appendectomy, and cystoscopy, who presents to the ED with complaints of gradual onset right flank pain that has been intermittent 2 weeks but gradually worsening and today has become more intense. She was seen at urgent care prior to arrival, had a UA that had positive nitrates, but they felt that she needed further evaluation because "a simple UTI could not be the source of her pain". She describes the pain is 8/10 intermittent aching with occasional sharp R flank pain radiating into the right upper quadrant and right shoulder, worse with breathing and movement, with no treatments tried prior to arrival. She denies any recent heavy lifting, twisting, or bending, or any trauma. Denies history of kidney stones but states she has had multiple "kidney infections". She has never needed admission for any of these infections.  She denies any fevers, chills, chest pain, shortness breath, nausea, vomiting, diarrhea, constipation, melena, hematochezia, obstipation, dysuria, hematuria, increased urinary frequency or urgency, vaginal bleeding or discharge, numbness, tingling, weakness, recent travel, sick contacts, suspicious food intake, chronic NSAID use, or chronic alcohol use. She is under the care of a urologist for her recurrent UTIs.  Patient is a 65 y.o. female presenting with flank pain. The history is provided by the patient and medical records. No language interpreter was  used.  Flank Pain This is a new problem. The current episode started 1 to 4 weeks ago. The problem occurs intermittently. The problem has been gradually worsening. Associated symptoms include abdominal pain (radiating from R flank). Pertinent negatives include no arthralgias, chest pain, chills, fever, myalgias, nausea, numbness, urinary symptoms, vomiting or weakness. Exacerbated by: breathing/moving. She has tried nothing for the symptoms. The treatment provided no relief.    Past Medical History  Diagnosis Date  . Diabetes mellitus     dx'd 4 yrs ago-NIDDM  . GERD (gastroesophageal reflux disease)   . Arthritis   . SUI (stress urinary incontinence, female) 05/08/2011  . 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 PONV (postoperative nausea and vomiting)   . Angina     history of angina- none in 5 yrs  . Myocardial infarction (HCC) 2005    no blockage found in heart cath  . Asthma     years ago & related to GERD  . UTI (urinary tract infection)   . Urine protein increased   . Allergy   . Cataract     growing now  . Hyperlipidemia     controlled on medicines    Past Surgical History  Procedure Laterality Date  . Cardiac catheterization    . Cystoscopy    . Abdominal hysterectomy    . Back surgery    . Bladder surgery  05/08/11    Bladder Sling  . Bladder surgery  05/08/11    Bladder Sling  . Bladder suspension  05/08/2011    Procedure: TRANSVAGINAL TAPE (TVT) PROCEDURE;  Surgeon: Roselle Locus II;  Location: [facility];  Service: Gynecology;  Laterality: N/A;  Transobturator Tape With Cystoscopy  . Total hip arthroplasty  03/31/2012    Procedure: TOTAL HIP ARTHROPLASTY;  Surgeon: Nestor Lewandowsky, MD;  Location: [facility];  Service: Orthopedics;  Laterality: Right;  . Colonoscopy    . Polypectomy     Family History  Problem Relation Age of Onset  . Colon cancer Neg Hx   . Rectal cancer Neg Hx   . Stomach cancer Neg Hx   .  Esophageal cancer Neg Hx   . Heart disease Father   . Colon polyps Brother     colon ruptured last summer   Social History  Substance Use Topics  . Smoking status: Former Smoker -- 1.00 packs/day for 30 years    Types: Cigarettes    Quit date: 03/23/2004  . Smokeless tobacco: Never Used  . Alcohol Use: 0.6 oz/week    1 Glasses of wine per week     Comment: occasionally   OB History    Gravida Para Term Preterm AB TAB SAB Ectopic Multiple Living   5 5 5       5      Review of Systems  Constitutional: Negative for fever and chills.  Respiratory: Negative for shortness of breath.   Cardiovascular: Negative for chest pain.  Gastrointestinal: Positive for abdominal pain (radiating from R flank). Negative for nausea, vomiting, diarrhea, constipation and blood in stool.  Genitourinary: Positive for flank pain. Negative for dysuria, frequency, hematuria, vaginal bleeding and vaginal discharge.  Musculoskeletal: Negative for myalgias and arthralgias.  Skin: Negative for color change.  Allergic/Immunologic: Negative for immunocompromised state.  Neurological: Negative for weakness and numbness.  Psychiatric/Behavioral: Negative for confusion.   10 Systems reviewed and are negative for acute change except as noted in the HPI.    Allergies  Nickel and Vicodin  Home Medications   Prior to Admission medications   Medication Sig Start Date End Date Taking? Authorizing Provider  Albiglutide 30 MG PEN Inject 30 mg into the skin every 7 (seven) days. tanzeum   Yes Historical Provider, MD  aspirin EC 81 MG tablet Take 81 mg by mouth daily.   Yes Historical Provider, MD  atorvastatin (LIPITOR) 20 MG tablet Take 20 mg by mouth daily.   Yes Historical Provider, MD  cetirizine (ZYRTEC) 10 MG tablet Take 10 mg by mouth daily.   Yes Historical Provider, MD  glimepiride (AMARYL) 2 MG tablet Take 2 mg by mouth daily.   Yes Historical Provider, MD  lansoprazole (PREVACID) 30 MG capsule Take 30 mg  by mouth every other day.   Yes Historical Provider, MD  lisinopril (PRINIVIL,ZESTRIL) 10 MG tablet Take 10 mg by mouth daily.    Yes Historical Provider, MD  metFORMIN (GLUCOPHAGE) 1000 MG tablet Take 1,000 mg by mouth 2 (two) times daily with a meal.   Yes Historical Provider, MD  methocarbamol (ROBAXIN) 500 MG tablet Take 500 mg by mouth at bedtime. Reported on 03/08/2016   Yes Historical Provider, MD  Multiple Vitamins-Minerals (CENTRUM SILVER ADULT 50+ PO) Take 1 tablet by mouth daily.   Yes Historical Provider, MD  ONE TOUCH ULTRA TEST test strip  03/26/15  Yes Historical Provider, MD  Ubiquinol 100 MG CAPS Take 1 tablet by mouth daily.   Yes Historical Provider, MD  Vitamin D, Ergocalciferol, (DRISDOL) 50000 units CAPS capsule Take 50,000 Units by mouth every 7 (seven) days.   Yes Historical Provider, MD   BP 123/77 mmHg  Pulse 99  Temp(Src) 98.4 F (36.9 C) (Oral)  Resp 16  SpO2 96% Physical Exam  Constitutional: She is oriented to person, place, and time. Vital signs are normal. She appears well-developed and well-nourished.  Non-toxic appearance. No distress.  Afebrile, nontoxic, NAD  HENT:  Head: Normocephalic and atraumatic.  Mouth/Throat: Oropharynx is clear and moist and mucous membranes are normal.  Eyes: Conjunctivae and EOM are normal. Right eye exhibits no discharge. Left eye exhibits no discharge.  Neck: Normal range of motion. Neck supple.  Cardiovascular: Normal rate, regular rhythm, normal heart sounds and intact distal pulses.  Exam reveals no gallop and no friction rub.   No murmur heard. Pulmonary/Chest: Effort normal and breath sounds normal. No respiratory distress. She has no decreased breath sounds. She has no wheezes. She has no rhonchi. She has no rales.  Abdominal: Soft. Normal appearance and bowel sounds are normal. She exhibits no distension. There is tenderness in the right upper quadrant. There is no rigidity, no rebound, no guarding, no CVA tenderness, no  tenderness at McBurney's point and negative Murphy's sign.    Soft, nondistended, +BS throughout, with mild RUQ tenderness over the lateral aspect of the costal margin extending towards the R flank, no r/g/r, neg murphy's, neg mcburney's, and without CVA TTP   Musculoskeletal: Normal range of motion.  Neurological: She is alert and oriented to person, place, and time. She has normal strength. No sensory deficit.  Skin: Skin is warm, dry and intact. No rash noted.  Psychiatric: She has a normal mood and affect.  Nursing note and vitals reviewed.   ED Course  Procedures (including critical care time) Labs Review Labs Reviewed  COMPREHENSIVE METABOLIC PANEL - Abnormal; Notable for the following:    Glucose, Bld 63 (*)    BUN 22 (*)    Creatinine, Ser 1.04 (*)    GFR calc non Af Amer 56 (*)    All other components within normal limits  URINALYSIS, ROUTINE W REFLEX MICROSCOPIC (NOT AT [facility]) - Abnormal; Notable for the following:    APPearance CLOUDY (*)    Protein, ur 100 (*)    Nitrite POSITIVE (*)    Leukocytes, UA TRACE (*)    All other components within normal limits  URINE MICROSCOPIC-ADD ON - Abnormal; Notable for the following:    Squamous Epithelial / LPF 6-30 (*)    Bacteria, UA MANY (*)    All other components within normal limits  CBG MONITORING, ED - Abnormal; Notable for the following:    Glucose-Capillary 104 (*)    All other components within normal limits  URINE CULTURE  LIPASE, BLOOD  CBC    Imaging Review Ct Renal Stone Study  03/08/2016  CLINICAL DATA:  Acute onset of right lower quadrant abdominal pain, radiating to the back. Initial encounter. EXAM: CT ABDOMEN AND PELVIS WITHOUT CONTRAST TECHNIQUE: Multidetector CT imaging of the abdomen and pelvis was performed following the standard protocol without IV contrast. COMPARISON:  CT of the abdomen and pelvis performed 03/17/2015 FINDINGS: Minimal bibasilar atelectasis is noted. The liver and spleen are  unremarkable in appearance. The gallbladder is within normal limits. The pancreas and adrenal glands are unremarkable. Mild nonspecific perinephric stranding is noted bilaterally. There is no evidence of hydronephrosis. No renal or ureteral stones are identified. No free fluid is identified. The small bowel is unremarkable in appearance. The stomach is within normal limits. No acute vascular abnormalities are seen. Minimal calcification is seen along the abdominal aorta. The appendix  is not well seen; there is no evidence for appendicitis. Minimal diverticulosis is noted along the sigmoid colon, without evidence of diverticulitis. The bladder is mildly distended and grossly unremarkable. The patient is status post hysterectomy. No suspicious adnexal masses are seen. No inguinal lymphadenopathy is seen. No acute osseous abnormalities are identified. The patient's right hip arthroplasty is incompletely imaged, but appears grossly unremarkable. The patient is status post interbody fusion at L3-L4. Vacuum phenomenon is noted at L5-S1, which is often a normal finding. IMPRESSION: 1. No acute abnormality seen within the abdomen or pelvis. 2. Minimal diverticulosis along the sigmoid colon, without evidence of diverticulitis. Electronically Signed   By: Roanna Raider M.D.   On: 03/08/2016 19:45   I have personally reviewed and evaluated these images and lab results as part of my medical decision-making.   EKG Interpretation None      MDM   Final diagnoses:  Right flank pain  AKI (acute kidney injury) (HCC)  Diverticulosis of large intestine without hemorrhage  Hypoglycemia  Pyelonephritis  UTI (urinary tract infection), uncomplicated    65 y.o. female here with R flank pain x2wks worsening today, had had "kidney infections" in the past and states she thinks she's getting one. Went to [facility] and had a U/A with +nitrites but no leuks, was sent here because they felt this infection could not be the only cause  of her pain. On exam, R lateral abdomen tenderness around costal margin, extending toward R CVA, but no CVA TTP. No other abdominal tenderness, nonperitoneal on exam. Afebrile and well appearing. Will get U/A again here to give [place] a chance to look at it under a microscope, as well as culture it. Will go ahead and start rocephin. Labs obtained in triage show CBC WNL, but other labs pending. Will get CT renal study to ensure no other etiologies including nephrolithiasis. Will give pain meds and fluids. Will reassess shortly.   10:22 PM U/A cloudy with +nitrite, +leuks, 6-30 WBC and many bacteria, culture sent. Lipase WNL, CMP with gluc 63 which could be due to the fact that the pt hadn't eaten anything since this morning, has since been given food and she feels fine, will recheck CBG prior to d/c. CT showing minimal diverticulosis without diverticulitis, also shows some mild nonspecific perinephric stranding bilaterally, wonder whether this is early pyelo. Rocephin given, unfortunately it was just recently started since there were delays getting IV established. Plan is to d/c home with keflex x10 days for pyelo tx. Tylenol/motrin for pain. Discussed high fiber diet and increased water intake for diverticulosis. F/up with PCP in 1wk. Pt declines wanting morphine now, states she feels fine and would rather not get this now since it took longer than anticipated. Will await CBG and finish abx/fluids, and then d/c home with these instructions.  11:34 PM CBG 104. D/c home with previously discussed plan. I explained the diagnosis and have given explicit precautions to return to the ER including for any other new or worsening symptoms. The patient understands and accepts the medical plan as it's been dictated and I have answered their questions. Discharge instructions concerning home care and prescriptions have been given. The patient is STABLE and is discharged to home in good condition.   BP 121/93 mmHg  Pulse 78   Temp(Src) 98.4 F (36.9 C) (Oral)  Resp 16  SpO2 97%  Meds ordered this encounter  Medications  . sodium chloride 0.9 % bolus 1,000 mL    Sig:   .  cefTRIAXone (ROCEPHIN) 1 g in dextrose 5 % 50 mL IVPB    Sig:   . cephALEXin (KEFLEX) 500 MG capsule    Sig: Take 2 capsules (1,000 mg total) by mouth 2 (two) times daily. x 10 days    Dispense:  40 capsule    Refill:  0    Order Specific Question:  Supervising Provider    Answer:  Eber Hong [[id number]]     Yaw Escoto Camprubi-Soms, PA-C 03/08/16 1610  Vanetta Mulders, MD 03/10/16 9604

## 2016-03-08 NOTE — ED Notes (Signed)
Pt reports R flank pain since yesterday. Pain has been intermittent there for some time. Went to [facility] for this and had UA done. Was told to come here for further eval. No urinary symptoms.

## 2016-03-08 NOTE — Progress Notes (Signed)
Regina Kim is a 65 y.o. female who presents to Urgent Care today for abdominal pain. Patient has a one-day history of acute onset of right lower quadrant abdominal pain radiating to the back. She denies any fevers or chills vomiting or diarrhea. Pain is quite severe at times. She's had some mild intermittent right lower quadrant abdominal pain previously off-and-on for the last several weeks. She has not tried any medications yet. She denies any significant urinary frequency urgency or dysuria. She has a history of appendectomy injury her childhood. She denies any other abdominal surgical history.   Past Medical History  Diagnosis Date  . Diabetes mellitus     dx'd 4 yrs ago-NIDDM  . GERD (gastroesophageal reflux disease)   . Arthritis   . SUI (stress urinary incontinence, female) 05/08/2011  . 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 PONV (postoperative nausea and vomiting)   . Angina     history of angina- none in 5 yrs  . Myocardial infarction (HCC) 2005    no blockage found in heart cath  . Asthma     years ago & related to GERD  . UTI (urinary tract infection)   . Urine protein increased   . Allergy   . Cataract     growing now  . Hyperlipidemia     controlled on medicines    Past Surgical History  Procedure Laterality Date  . Cardiac catheterization    . Cystoscopy    . Abdominal hysterectomy    . Back surgery    . Bladder surgery  05/08/11    Bladder Sling  . Bladder surgery  05/08/11    Bladder Sling  . Bladder suspension  05/08/2011    Procedure: TRANSVAGINAL TAPE (TVT) PROCEDURE;  Surgeon: Roselle Locus II;  Location: [facility];  Service: Gynecology;  Laterality: N/A;  Transobturator Tape With Cystoscopy  . Total hip arthroplasty  03/31/2012    Procedure: TOTAL HIP ARTHROPLASTY;  Surgeon: Nestor Lewandowsky, MD;  Location: [facility] OR;  Service: Orthopedics;  Laterality: Right;  . Colonoscopy    . Polypectomy     Social  History  Substance Use Topics  . Smoking status: Former Smoker -- 1.00 packs/day for 30 years    Types: Cigarettes    Quit date: 03/23/2004  . Smokeless tobacco: Never Used  . Alcohol Use: 0.6 oz/week    1 Glasses of wine per week     Comment: occasionally   ROS as above Medications: Current Outpatient Prescriptions  Medication Sig Dispense Refill  . Albiglutide 30 MG PEN Inject into the skin. tanzeum    . atorvastatin (LIPITOR) 10 MG tablet Take 10 mg by mouth daily.    . cetirizine (ZYRTEC) 10 MG tablet Take 10 mg by mouth daily.    Marland Kitchen lisinopril (PRINIVIL,ZESTRIL) 10 MG tablet Take 10 mg by mouth daily.     . meloxicam (MOBIC) 15 MG tablet Take 15 mg by mouth at bedtime.    . metFORMIN (GLUCOPHAGE) 1000 MG tablet Take 1,000 mg by mouth 2 (two) times daily with a meal.    . omeprazole (PRILOSEC) 20 MG capsule Take 20 mg by mouth at bedtime.    . ONE TOUCH ULTRA TEST test strip     . traMADol (ULTRAM) 50 MG tablet Take 1 tablet (50 mg total) by mouth every 6 (six) hours as needed. 15 tablet 0  . methocarbamol (ROBAXIN)  500 MG tablet Take 500 mg by mouth at bedtime. Reported on 03/08/2016     No current facility-administered medications for this visit.   Allergies  Allergen Reactions  . Nickel Rash  . Vicodin [Hydrocodone-Acetaminophen] Nausea And Vomiting     Exam:  BP 110/78 mmHg  Pulse 90  Temp(Src) 98.2 F (36.8 C) (Oral)  Resp 18  Ht 5\' 6"  (1.676 m)  Wt 186 lb (84.369 kg)  BMI 30.04 kg/m2  SpO2 97% Gen: Well NAD Uncomfortable/in pain appearing. HEENT: EOMI,  MMM Lungs: Normal work of breathing. CTABL Heart: RRR no MRG Abd: NABS, Soft. Nondistended, Tender palpation right lower quadrant with rebound and guarding present. Exts: Brisk capillary refill, warm and well perfused.   Results for orders placed or performed in visit on 03/08/16 (from the past 24 hour(s))  POCT urinalysis dipstick     Status: Abnormal   Collection Time: 03/08/16  4:46 PM  Result Value Ref  Range   Color, UA yellow yellow   Clarity, UA clear clear   Glucose, UA negative negative   Bilirubin, UA negative negative   Ketones, POC UA negative negative   Spec Grav, UA 1.020    Blood, UA negative negative   pH, UA 5.5    Protein Ur, POC =100 (A) negative   Urobilinogen, UA 0.2    Nitrite, UA Positive (A) Negative   Leukocytes, UA Negative Negative   No results found.  Assessment and Plan: 65 y.o. female with Abdominal pain radiating to the back. Patient has never had pain like this before. She is guarding and has rebound tenderness. The mildly positive urinalysis does not explain the degree of pain she is experiencing. Plan to transfer to the emergency department for further evaluation and management.  Discussed warning signs or symptoms. Please see discharge instructions. Patient expresses understanding.

## 2016-03-08 NOTE — Patient Instructions (Addendum)
     Thank you for coming in today. Go directly to the ED for evaluation.    IF you received an x-ray today, you will receive an invoice from [facility]. Please contact [facility] at [phone] with questions or concerns regarding your invoice.   IF you received labwork today, you will receive an invoice from [facility]. Please contact [facility] at [phone] with questions or concerns regarding your invoice.   Our billing staff will not be able to assist you with questions regarding bills from these companies.  You will be contacted with the lab results as soon as they are available. The fastest way to get your results is to activate your My Chart account. Instructions are located on the last page of this paperwork. If you have not heard from us regarding the results in 2 weeks, please contact this office.

## 2016-03-08 NOTE — Discharge Instructions (Signed)
Your symptoms are likely from a urinary tract infection that could be developing into a kidney infection. Your scan showed diverticulosis in your colon which is usually caused by not eating enough fiber in your diet, increase fiber and water intake. Stay very well hydrated with plenty of water throughout the day. Take antibiotic until completed. Use tylenol or motrin as needed for pain. Follow up with your primary care physician in 1 week for recheck of ongoing symptoms but return to ER for emergent changing or worsening of symptoms. Please seek immediate care if you develop the following: You develop back pain.  Your symptoms are no better, or worse in 3 days. There is severe back pain or lower abdominal pain.  You develop chills.  You have a fever.  There is nausea or vomiting.  There is continued burning or discomfort with urination.    Flank Pain Flank pain is pain in your side. The flank is the area of your side between your upper belly (abdomen) and your back. Pain in this area can be caused by many different things. HOME CARE Home care and treatment will depend on the cause of your pain.  Rest as told by your doctor.  Drink enough fluids to keep your pee (urine) clear or pale yellow.  Only take medicine as told by your doctor.  Tell your doctor about any changes in your pain.  Follow up with your doctor. GET HELP RIGHT AWAY IF:   Your pain does not get better with medicine.   You have new symptoms or your symptoms get worse.  Your pain gets worse.   You have belly (abdominal) pain.   You are short of breath.   You always feel sick to your stomach (nauseous).   You keep throwing up (vomiting).   You have puffiness (swelling) in your belly.   You feel light-headed or you pass out (faint).   You have blood in your pee.  You have a fever or lasting symptoms for more than 2-3 days.  You have a fever and your symptoms suddenly get worse. MAKE SURE YOU:    Understand these instructions.  Will watch your condition.  Will get help right away if you are not doing well or get worse.   This information is not intended to replace advice given to you by your health care provider. Make sure you discuss any questions you have with your health care provider.   Document Released: 07/15/2008 Document Revised: 10/27/2014 Document Reviewed: 05/20/2012 Elsevier Interactive Patient Education 2016 [employer] Inc[employer]   Pyelonephritis, Adult Pyelonephritis is a kidney infection. The kidneys are organs that help clean your blood by moving waste out of your blood and into your pee (urine). This infection can happen quickly, or it can last for a long time. In most cases, it clears up with treatment and does not cause other problems. HOME CARE Medicines  Take over-the-counter and prescription medicines only as told by your doctor.  Take your antibiotic medicine as told by your doctor. Do not stop taking the medicine even if you start to feel better. General Instructions  Drink enough fluid to keep your pee clear or pale yellow.  Avoid caffeine, tea, and carbonated drinks.  Pee (urinate) often. Avoid holding in pee for long periods of time.  Pee before and after sex.  After pooping (having a bowel movement), women should wipe from front to back. Use each tissue only once.  Keep all follow-up visits as told by your doctor.  This is important. GET HELP IF:  You do not feel better after 2 days.  Your symptoms get worse.  You have a fever. GET HELP RIGHT AWAY IF:  You cannot take your medicine or drink fluids as told.  You have chills and shaking.  You throw up (vomit).  You have very bad pain in your side (flank) or back.  You feel very weak or you pass out (faint).   This information is not intended to replace advice given to you by your health care provider. Make sure you discuss any questions you have with your health care provider.    Document Released: 11/13/2004 Document Revised: 06/27/2015 Document Reviewed: 01/29/2015 Elsevier Interactive Patient Education 2016 Else[employer] Inc. Urinary Tract Infection Urinary tract infections (UTIs) can develop anywhere along your urinary tract. Your urinary tract is your body's drainage system for removing wastes and extra water. Your urinary tract includes two kidneys, two ureters, a bladder, and a urethra. Your kidneys are a pair of bean-shaped organs. Each kidney is about the size of your fist. They are located below your ribs, one on each side of your spine. CAUSES Infections are caused by microbes, which are microscopic organisms, including fungi, viruses, and bacteria. These organisms are so small that they can only be seen through a microscope. Bacteria are the microbes that most commonly cause UTIs. SYMPTOMS  Symptoms of UTIs may vary by age and gender of the patient and by the location of the infection. Symptoms in young women typically include a frequent and intense urge to urinate and a painful, burning feeling in the bladder or urethra during urination. Older women and men are more likely to be tired, shaky, and weak and have muscle aches and abdominal pain. A fever may mean the infection is in your kidneys. Other symptoms of a kidney infection include pain in your back or sides below the ribs, nausea, and vomiting. DIAGNOSIS To diagnose a UTI, your caregiver will ask you about your symptoms. Your caregiver will also ask you to provide a urine sample. The urine sample will be tested for bacteria and white blood cells. White blood cells are made by your body to help fight infection. TREATMENT  Typically, UTIs can be treated with medication. Because most UTIs are caused by a bacterial infection, they usually can be treated with the use of antibiotics. The choice of antibiotic and length of treatment depend on your symptoms and the type of bacteria causing your infection. HOME CARE  INSTRUCTIONS  If you were prescribed antibiotics, take them exactly as your caregiver instructs you. Finish the medication even if you feel better after you have only taken some of the medication.  Drink enough water and fluids to keep your urine clear or pale yellow.  Avoid caffeine, tea, and carbonated beverages. They tend to irritate your bladder.  Empty your bladder often. Avoid holding urine for long periods of time.  Empty your bladder before and after sexual intercourse.  After a bowel movement, women should cleanse from front to back. Use each tissue only once. SEEK MEDICAL CARE IF:   You have back pain.  You develop a fever.  Your symptoms do not begin to resolve within 3 days. SEEK IMMEDIATE MEDICAL CARE IF:   You have severe back pain or lower abdominal pain.  You develop chills.  You have nausea or vomiting.  You have continued burning or discomfort with urination. MAKE SURE YOU:   Understand these instructions.  Will watch your condition.  Will get help right away if you are not doing well or get worse.   This information is not intended to replace advice given to you by your health care provider. Make sure you discuss any questions you have with your health care provider.   Document Released: 07/16/2005 Document Revised: 06/27/2015 Document Reviewed: 11/14/2011 Elsevier Interactive Patient Education 2016 [employer].  Diverticulosis Diverticulosis is the condition that develops when small pouches (diverticula) form in the wall of your colon. Your colon, or large intestine, is where water is absorbed and stool is formed. The pouches form when the inside layer of your colon pushes through weak spots in the outer layers of your colon. CAUSES  No one knows exactly what causes diverticulosis. RISK FACTORS  Being older than 50. Your risk for this condition increases with age. Diverticulosis is rare in people younger than 40 years. By age 47, almost everyone  has it.  Eating a low-fiber diet.  Being frequently constipated.  Being overweight.  Not getting enough exercise.  Smoking.  Taking over-the-counter pain medicines, like aspirin and ibuprofen. SYMPTOMS  Most people with diverticulosis do not have symptoms. DIAGNOSIS  Because diverticulosis often has no symptoms, health care providers often discover the condition during an exam for other colon problems. In many cases, a health care provider will diagnose diverticulosis while using a flexible scope to examine the colon (colonoscopy). TREATMENT  If you have never developed an infection related to diverticulosis, you may not need treatment. If you have had an infection before, treatment may include:  Eating more fruits, vegetables, and grains.  Taking a fiber supplement.  Taking a live bacteria supplement (probiotic).  Taking medicine to relax your colon. HOME CARE INSTRUCTIONS   Drink at least 6-8 glasses of water each day to prevent constipation.  Try not to strain when you have a bowel movement.  Keep all follow-up appointments. If you have had an infection before:  Increase the fiber in your diet as directed by your health care provider or dietitian.  Take a dietary fiber supplement if your health care provider approves.  Only take medicines as directed by your health care provider. SEEK MEDICAL CARE IF:   You have abdominal pain.  You have bloating.  You have cramps.  You have not gone to the bathroom in 3 days. SEEK IMMEDIATE MEDICAL CARE IF:   Your pain gets worse.  Yourbloating becomes very bad.  You have a fever or chills, and your symptoms suddenly get worse.  You begin vomiting.  You have bowel movements that are bloody or black. MAKE SURE YOU:  Understand these instructions.  Will watch your condition.  Will get help right away if you are not doing well or get worse.   This information is not intended to replace advice given to you by  your health care provider. Make sure you discuss any questions you have with your health care provider.   Document Released: 07/03/2004 Document Revised: 10/11/2013 Document Reviewed: 08/31/2013 Elsevier Interactive Patient Education 2016 Elsevier Inc.  High-Fiber Diet Fiber, also called dietary fiber, is a type of carbohydrate found in fruits, vegetables, whole grains, and beans. A high-fiber diet can have many health benefits. Your health care provider may recommend a high-fiber diet to help:  Prevent constipation. Fiber can make your bowel movements more regular.  Lower your cholesterol.  Relieve hemorrhoids, uncomplicated diverticulosis, or irritable bowel syndrome.  Prevent overeating as part of a weight-loss plan.  Prevent heart disease, type 2 diabetes, and  certain cancers. WHAT IS MY PLAN? The recommended daily intake of fiber includes:  38 grams for men under age 43.  30 grams for men over age 79.  25 grams for women under age 33.  21 grams for women over age 61. You can get the recommended daily intake of dietary fiber by eating a variety of fruits, vegetables, grains, and beans. Your health care provider may also recommend a fiber supplement if it is not possible to get enough fiber through your diet. WHAT DO I NEED TO KNOW ABOUT A HIGH-FIBER DIET?  Fiber supplements have not been widely studied for their effectiveness, so it is better to get fiber through food sources.  Always check the fiber content on thenutrition facts label of any prepackaged food. Look for foods that contain at least 5 grams of fiber per serving.  Ask your dietitian if you have questions about specific foods that are related to your condition, especially if those foods are not listed in the following section.  Increase your daily fiber consumption gradually. Increasing your intake of dietary fiber too quickly may cause bloating, cramping, or gas.  Drink plenty of water. Water helps you to  digest fiber. WHAT FOODS CAN I EAT? Grains Whole-grain breads. Multigrain cereal. Oats and oatmeal. Brown rice. Barley. Bulgur wheat. Millet. Bran muffins. Popcorn. Rye wafer crackers. Vegetables Sweet potatoes. Spinach. Kale. Artichokes. Cabbage. Broccoli. Green peas. Carrots. Squash. Fruits Berries. Pears. Apples. Oranges. Avocados. Prunes and raisins. Dried figs. Meats and Other Protein Sources Navy, kidney, pinto, and soy beans. Split peas. Lentils. Nuts and seeds. Dairy Fiber-fortified yogurt. Beverages Fiber-fortified soy milk. Fiber-fortified orange juice. Other Fiber bars. The items listed above may not be a complete list of recommended foods or beverages. Contact your dietitian for more options. WHAT FOODS ARE NOT RECOMMENDED? Grains White bread. Pasta made with refined flour. White rice. Vegetables Fried potatoes. Canned vegetables. Well-cooked vegetables.  Fruits Fruit juice. Cooked, strained fruit. Meats and Other Protein Sources Fatty cuts of meat. Fried Environmental education officer or fried fish. Dairy Milk. Yogurt. Cream cheese. Sour cream. Beverages Soft drinks. Other Cakes and pastries. Butter and oils. The items listed above may not be a complete list of foods and beverages to avoid. Contact your dietitian for more information. WHAT ARE SOME TIPS FOR INCLUDING HIGH-FIBER FOODS IN MY DIET?  Eat a wide variety of high-fiber foods.  Make sure that half of all grains consumed each day are whole grains.  Replace breads and cereals made from refined flour or white flour with whole-grain breads and cereals.  Replace white rice with brown rice, bulgur wheat, or millet.  Start the day with a breakfast that is high in fiber, such as a cereal that contains at least 5 grams of fiber per serving.  Use beans in place of meat in soups, salads, or pasta.  Eat high-fiber snacks, such as berries, raw vegetables, nuts, or popcorn.   This information is not intended to replace advice given  to you by your health care provider. Make sure you discuss any questions you have with your health care provider.   Document Released: 10/06/2005 Document Revised: 10/27/2014 Document Reviewed: 03/21/2014 Elsevier Interactive Patient Education [employer]

## 2016-03-11 LAB — URINE CULTURE: Culture: >=100000 — AB

## 2017-08-23 DIAGNOSIS — Z23 Encounter for immunization: Secondary | ICD-10-CM | POA: Diagnosis not present

## 2017-10-22 DIAGNOSIS — Z7984 Long term (current) use of oral hypoglycemic drugs: Secondary | ICD-10-CM | POA: Diagnosis not present

## 2017-10-22 DIAGNOSIS — E1121 Type 2 diabetes mellitus with diabetic nephropathy: Secondary | ICD-10-CM | POA: Diagnosis not present

## 2017-10-22 DIAGNOSIS — E7849 Other hyperlipidemia: Secondary | ICD-10-CM | POA: Diagnosis not present

## 2017-11-04 DIAGNOSIS — H6691 Otitis media, unspecified, right ear: Secondary | ICD-10-CM | POA: Diagnosis not present

## 2017-11-04 DIAGNOSIS — J029 Acute pharyngitis, unspecified: Secondary | ICD-10-CM | POA: Diagnosis not present

## 2017-11-04 DIAGNOSIS — R21 Rash and other nonspecific skin eruption: Secondary | ICD-10-CM | POA: Diagnosis not present

## 2017-11-13 DIAGNOSIS — H02835 Dermatochalasis of left lower eyelid: Secondary | ICD-10-CM | POA: Diagnosis not present

## 2017-11-13 DIAGNOSIS — H52223 Regular astigmatism, bilateral: Secondary | ICD-10-CM | POA: Diagnosis not present

## 2017-11-13 DIAGNOSIS — H02832 Dermatochalasis of right lower eyelid: Secondary | ICD-10-CM | POA: Diagnosis not present

## 2017-11-13 DIAGNOSIS — H524 Presbyopia: Secondary | ICD-10-CM | POA: Diagnosis not present

## 2017-11-13 DIAGNOSIS — H5213 Myopia, bilateral: Secondary | ICD-10-CM | POA: Diagnosis not present

## 2017-11-13 DIAGNOSIS — H02831 Dermatochalasis of right upper eyelid: Secondary | ICD-10-CM | POA: Diagnosis not present

## 2017-11-13 DIAGNOSIS — H353131 Nonexudative age-related macular degeneration, bilateral, early dry stage: Secondary | ICD-10-CM | POA: Diagnosis not present

## 2017-11-13 DIAGNOSIS — H02834 Dermatochalasis of left upper eyelid: Secondary | ICD-10-CM | POA: Diagnosis not present

## 2018-04-29 DIAGNOSIS — I10 Essential (primary) hypertension: Secondary | ICD-10-CM | POA: Diagnosis not present

## 2018-05-03 DIAGNOSIS — I10 Essential (primary) hypertension: Secondary | ICD-10-CM | POA: Diagnosis not present

## 2018-05-03 DIAGNOSIS — R801 Persistent proteinuria, unspecified: Secondary | ICD-10-CM | POA: Diagnosis not present

## 2018-05-03 DIAGNOSIS — E1121 Type 2 diabetes mellitus with diabetic nephropathy: Secondary | ICD-10-CM | POA: Diagnosis not present

## 2018-05-06 DIAGNOSIS — I10 Essential (primary) hypertension: Secondary | ICD-10-CM | POA: Diagnosis not present

## 2018-05-06 DIAGNOSIS — Z7984 Long term (current) use of oral hypoglycemic drugs: Secondary | ICD-10-CM | POA: Diagnosis not present

## 2018-05-06 DIAGNOSIS — E1121 Type 2 diabetes mellitus with diabetic nephropathy: Secondary | ICD-10-CM | POA: Diagnosis not present

## 2018-05-06 DIAGNOSIS — E782 Mixed hyperlipidemia: Secondary | ICD-10-CM | POA: Diagnosis not present

## 2018-05-06 DIAGNOSIS — Z23 Encounter for immunization: Secondary | ICD-10-CM | POA: Diagnosis not present

## 2018-05-06 DIAGNOSIS — Z0000 Encounter for general adult medical examination without abnormal findings: Secondary | ICD-10-CM | POA: Diagnosis not present

## 2018-05-10 ENCOUNTER — Other Ambulatory Visit: Payer: Self-pay | Admitting: Family Medicine

## 2018-05-11 ENCOUNTER — Other Ambulatory Visit: Payer: Self-pay | Admitting: Family Medicine

## 2018-05-11 DIAGNOSIS — Z1231 Encounter for screening mammogram for malignant neoplasm of breast: Secondary | ICD-10-CM

## 2018-05-11 DIAGNOSIS — M8580 Other specified disorders of bone density and structure, unspecified site: Secondary | ICD-10-CM

## 2018-06-14 DIAGNOSIS — E119 Type 2 diabetes mellitus without complications: Secondary | ICD-10-CM | POA: Diagnosis not present

## 2018-06-14 DIAGNOSIS — H2513 Age-related nuclear cataract, bilateral: Secondary | ICD-10-CM | POA: Diagnosis not present

## 2018-06-14 DIAGNOSIS — H353131 Nonexudative age-related macular degeneration, bilateral, early dry stage: Secondary | ICD-10-CM | POA: Diagnosis not present

## 2018-06-14 DIAGNOSIS — H04123 Dry eye syndrome of bilateral lacrimal glands: Secondary | ICD-10-CM | POA: Diagnosis not present

## 2018-06-23 ENCOUNTER — Ambulatory Visit
Admission: RE | Admit: 2018-06-23 | Discharge: 2018-06-23 | Disposition: A | Discharge status: Home / Self Care | Payer: Medicare Other | Source: Ambulatory Visit | Attending: Family Medicine | Admitting: Family Medicine

## 2018-06-23 DIAGNOSIS — Z780 Asymptomatic menopausal state: Secondary | ICD-10-CM | POA: Diagnosis not present

## 2018-06-23 DIAGNOSIS — M8580 Other specified disorders of bone density and structure, unspecified site: Secondary | ICD-10-CM

## 2018-06-23 DIAGNOSIS — M85852 Other specified disorders of bone density and structure, left thigh: Secondary | ICD-10-CM | POA: Diagnosis not present

## 2018-06-23 DIAGNOSIS — Z1231 Encounter for screening mammogram for malignant neoplasm of breast: Secondary | ICD-10-CM | POA: Diagnosis not present

## 2018-07-22 DIAGNOSIS — M25511 Pain in right shoulder: Secondary | ICD-10-CM | POA: Diagnosis not present

## 2018-08-02 DIAGNOSIS — E119 Type 2 diabetes mellitus without complications: Secondary | ICD-10-CM | POA: Diagnosis not present

## 2018-08-02 DIAGNOSIS — H353131 Nonexudative age-related macular degeneration, bilateral, early dry stage: Secondary | ICD-10-CM | POA: Diagnosis not present

## 2018-08-02 DIAGNOSIS — H43812 Vitreous degeneration, left eye: Secondary | ICD-10-CM | POA: Diagnosis not present

## 2018-08-02 DIAGNOSIS — H2513 Age-related nuclear cataract, bilateral: Secondary | ICD-10-CM | POA: Diagnosis not present

## 2018-08-16 DIAGNOSIS — Z23 Encounter for immunization: Secondary | ICD-10-CM | POA: Diagnosis not present

## 2018-09-14 DIAGNOSIS — M25551 Pain in right hip: Secondary | ICD-10-CM | POA: Diagnosis not present

## 2018-09-20 DIAGNOSIS — S76311D Strain of muscle, fascia and tendon of the posterior muscle group at thigh level, right thigh, subsequent encounter: Secondary | ICD-10-CM | POA: Diagnosis not present

## 2018-09-20 DIAGNOSIS — M6281 Muscle weakness (generalized): Secondary | ICD-10-CM | POA: Diagnosis not present

## 2018-09-22 DIAGNOSIS — M6281 Muscle weakness (generalized): Secondary | ICD-10-CM | POA: Diagnosis not present

## 2018-09-22 DIAGNOSIS — S76311D Strain of muscle, fascia and tendon of the posterior muscle group at thigh level, right thigh, subsequent encounter: Secondary | ICD-10-CM | POA: Diagnosis not present

## 2018-09-27 DIAGNOSIS — S76311D Strain of muscle, fascia and tendon of the posterior muscle group at thigh level, right thigh, subsequent encounter: Secondary | ICD-10-CM | POA: Diagnosis not present

## 2018-09-27 DIAGNOSIS — M6281 Muscle weakness (generalized): Secondary | ICD-10-CM | POA: Diagnosis not present

## 2018-09-29 DIAGNOSIS — S76311D Strain of muscle, fascia and tendon of the posterior muscle group at thigh level, right thigh, subsequent encounter: Secondary | ICD-10-CM | POA: Diagnosis not present

## 2018-09-29 DIAGNOSIS — M6281 Muscle weakness (generalized): Secondary | ICD-10-CM | POA: Diagnosis not present

## 2018-10-04 DIAGNOSIS — S76311D Strain of muscle, fascia and tendon of the posterior muscle group at thigh level, right thigh, subsequent encounter: Secondary | ICD-10-CM | POA: Diagnosis not present

## 2018-10-04 DIAGNOSIS — M6281 Muscle weakness (generalized): Secondary | ICD-10-CM | POA: Diagnosis not present

## 2018-10-05 DIAGNOSIS — M25551 Pain in right hip: Secondary | ICD-10-CM | POA: Diagnosis not present

## 2018-10-06 DIAGNOSIS — M6281 Muscle weakness (generalized): Secondary | ICD-10-CM | POA: Diagnosis not present

## 2018-10-06 DIAGNOSIS — S76311D Strain of muscle, fascia and tendon of the posterior muscle group at thigh level, right thigh, subsequent encounter: Secondary | ICD-10-CM | POA: Diagnosis not present

## 2018-10-11 DIAGNOSIS — S76311D Strain of muscle, fascia and tendon of the posterior muscle group at thigh level, right thigh, subsequent encounter: Secondary | ICD-10-CM | POA: Diagnosis not present

## 2018-10-11 DIAGNOSIS — M6281 Muscle weakness (generalized): Secondary | ICD-10-CM | POA: Diagnosis not present

## 2018-10-14 DIAGNOSIS — S76311D Strain of muscle, fascia and tendon of the posterior muscle group at thigh level, right thigh, subsequent encounter: Secondary | ICD-10-CM | POA: Diagnosis not present

## 2018-10-14 DIAGNOSIS — M6281 Muscle weakness (generalized): Secondary | ICD-10-CM | POA: Diagnosis not present

## 2018-10-19 DIAGNOSIS — S76311D Strain of muscle, fascia and tendon of the posterior muscle group at thigh level, right thigh, subsequent encounter: Secondary | ICD-10-CM | POA: Diagnosis not present

## 2018-10-19 DIAGNOSIS — M6281 Muscle weakness (generalized): Secondary | ICD-10-CM | POA: Diagnosis not present

## 2018-10-22 DIAGNOSIS — M6281 Muscle weakness (generalized): Secondary | ICD-10-CM | POA: Diagnosis not present

## 2018-10-22 DIAGNOSIS — S76311D Strain of muscle, fascia and tendon of the posterior muscle group at thigh level, right thigh, subsequent encounter: Secondary | ICD-10-CM | POA: Diagnosis not present

## 2018-10-26 DIAGNOSIS — S76311D Strain of muscle, fascia and tendon of the posterior muscle group at thigh level, right thigh, subsequent encounter: Secondary | ICD-10-CM | POA: Diagnosis not present

## 2018-10-26 DIAGNOSIS — M6281 Muscle weakness (generalized): Secondary | ICD-10-CM | POA: Diagnosis not present

## 2018-10-28 DIAGNOSIS — M6281 Muscle weakness (generalized): Secondary | ICD-10-CM | POA: Diagnosis not present

## 2018-10-28 DIAGNOSIS — S76311D Strain of muscle, fascia and tendon of the posterior muscle group at thigh level, right thigh, subsequent encounter: Secondary | ICD-10-CM | POA: Diagnosis not present

## 2018-11-02 DIAGNOSIS — S76311D Strain of muscle, fascia and tendon of the posterior muscle group at thigh level, right thigh, subsequent encounter: Secondary | ICD-10-CM | POA: Diagnosis not present

## 2018-11-02 DIAGNOSIS — M6281 Muscle weakness (generalized): Secondary | ICD-10-CM | POA: Diagnosis not present

## 2018-11-04 DIAGNOSIS — I10 Essential (primary) hypertension: Secondary | ICD-10-CM | POA: Diagnosis not present

## 2018-11-04 DIAGNOSIS — E782 Mixed hyperlipidemia: Secondary | ICD-10-CM | POA: Diagnosis not present

## 2018-11-04 DIAGNOSIS — E1121 Type 2 diabetes mellitus with diabetic nephropathy: Secondary | ICD-10-CM | POA: Diagnosis not present

## 2019-04-14 DIAGNOSIS — M25552 Pain in left hip: Secondary | ICD-10-CM | POA: Diagnosis not present

## 2019-04-14 DIAGNOSIS — M7062 Trochanteric bursitis, left hip: Secondary | ICD-10-CM | POA: Diagnosis not present

## 2019-04-18 DIAGNOSIS — M6281 Muscle weakness (generalized): Secondary | ICD-10-CM | POA: Diagnosis not present

## 2019-04-18 DIAGNOSIS — S76311D Strain of muscle, fascia and tendon of the posterior muscle group at thigh level, right thigh, subsequent encounter: Secondary | ICD-10-CM | POA: Diagnosis not present

## 2019-05-26 DIAGNOSIS — I10 Essential (primary) hypertension: Secondary | ICD-10-CM | POA: Diagnosis not present

## 2019-05-31 DIAGNOSIS — M545 Low back pain: Secondary | ICD-10-CM | POA: Diagnosis not present

## 2019-05-31 DIAGNOSIS — R801 Persistent proteinuria, unspecified: Secondary | ICD-10-CM | POA: Diagnosis not present

## 2019-05-31 DIAGNOSIS — E1121 Type 2 diabetes mellitus with diabetic nephropathy: Secondary | ICD-10-CM | POA: Diagnosis not present

## 2019-05-31 DIAGNOSIS — I10 Essential (primary) hypertension: Secondary | ICD-10-CM | POA: Diagnosis not present

## 2019-05-31 DIAGNOSIS — M5442 Lumbago with sciatica, left side: Secondary | ICD-10-CM | POA: Diagnosis not present

## 2019-05-31 DIAGNOSIS — M5441 Lumbago with sciatica, right side: Secondary | ICD-10-CM | POA: Diagnosis not present

## 2019-06-08 DIAGNOSIS — M545 Low back pain: Secondary | ICD-10-CM | POA: Diagnosis not present

## 2019-07-01 DIAGNOSIS — M5416 Radiculopathy, lumbar region: Secondary | ICD-10-CM | POA: Diagnosis not present

## 2019-07-01 DIAGNOSIS — M48061 Spinal stenosis, lumbar region without neurogenic claudication: Secondary | ICD-10-CM | POA: Diagnosis not present

## 2019-07-05 ENCOUNTER — Other Ambulatory Visit: Payer: Self-pay | Admitting: Neurological Surgery

## 2019-07-08 NOTE — Progress Notes (Signed)
[id number] - [place], [place] - [address] [place] [address] Phone: [phone] Fax: [phone]  [id number] - [place], [place] [address] Phone: [phone] Fax: [phone]      Your procedure is scheduled on Thursday, September 24th, 2020.  Report to [facility] Main Entrance "A" at 7:30 A.M., and check in at the Admitting office.   Call this number if you have problems the morning of surgery:  [phone]  Call [phone] if you have any questions prior to your surgery date Monday-Friday 8am-4pm    Remember:  Do not eat or drink after midnight the night before your surgery    Take these medicines the morning of surgery with A SIP OF WATER : Atorvastatin (Lipitor) Omeprazole (Prilosec) Polyethyl Glycol-Propyl Glycol (Systane OP)  Baclofen (Lioresal) - if needed  7 days prior to surgery STOP taking any Aspirin (unless otherwise instructed by your surgeon), Aleve, Naproxen, Ibuprofen, Motrin, Advil, Goody's, BC's, all herbal medications, fish oil, and all vitamins.  WHAT DO I DO ABOUT MY DIABETES MEDICATION?  Marland Kitchen Do not take oral diabetes medicines (pills) the morning of surgery.   How to Manage Your Diabetes Before and After Surgery  Why is it important to control my blood sugar before and after surgery? . Improving blood sugar levels before and after surgery helps healing and can limit problems. . A way of improving blood sugar control is eating a healthy diet by: o  Eating less sugar and carbohydrates o  Increasing activity/exercise o  Talking with your doctor about reaching your blood sugar goals . High blood sugars (greater than 180 mg/dL) can raise your risk of infections and slow your recovery, so you will need to focus on controlling your diabetes during the weeks before surgery. . Make sure that the doctor who takes care of your diabetes knows about  your planned surgery including the date and location.  How do I manage my blood sugar before surgery? . Check your blood sugar at least 4 times a day, starting 2 days before surgery, to make sure that the level is not too high or low. o Check your blood sugar the morning of your surgery when you wake up and every 2 hours until you get to the Short Stay unit. . If your blood sugar is less than 70 mg/dL, you will need to treat for low blood sugar: o Do not take insulin. o Treat a low blood sugar (less than 70 mg/dL) with  cup of clear juice (cranberry or apple), 4 glucose tablets, OR glucose gel. o Recheck blood sugar in 15 minutes after treatment (to make sure it is greater than 70 mg/dL). If your blood sugar is not greater than 70 mg/dL on recheck, call [phone] for further instructions. . Report your blood sugar to the short stay nurse when you get to Short Stay.  . If you are admitted to the hospital after surgery: o Your blood sugar will be checked by the staff and you will probably be given insulin after surgery (instead of oral diabetes medicines) to make sure you have good blood sugar levels. o The goal for blood sugar control after surgery is 80-180 mg/dL   The Morning of Surgery  Do not wear jewelry, make-up or nail polish.  Do not wear lotions, powders, or perfumes/colognes, or deodorant  Do not shave 48 hours prior to surgery.  Men may  shave face and neck.  Do not bring valuables to the hospital.  [facility] is not responsible for any belongings or valuables.  If you are a smoker, DO NOT Smoke 24 hours prior to surgery IF you wear a CPAP at night please bring your mask, tubing, and machine the morning of surgery   Remember that you must have someone to transport you home after your surgery, and remain with you for 24 hours if you are discharged the same day.   Contacts, glasses, hearing aids, dentures or bridgework may not be worn into surgery.    Leave your suitcase  in the car.  After surgery it may be brought to your room.  For patients admitted to the hospital, discharge time will be determined by your treatment team.  Patients discharged the day of surgery will not be allowed to drive home.    Special instructions:   [facility]- Preparing For Surgery  Before surgery, you can play an important role. Because skin is not sterile, your skin needs to be as free of germs as possible. You can reduce the number of germs on your skin by washing with CHG (chlorahexidine gluconate) Soap before surgery.  CHG is an antiseptic cleaner which kills germs and bonds with the skin to continue killing germs even after washing.    Oral Hygiene is also important to reduce your risk of infection.  Remember - BRUSH YOUR TEETH THE MORNING OF SURGERY WITH YOUR REGULAR TOOTHPASTE  Please do not use if you have an allergy to CHG or antibacterial soaps. If your skin becomes reddened/irritated stop using the CHG.  Do not shave (including legs and underarms) for at least 48 hours prior to first CHG shower. It is OK to shave your face.  Please follow these instructions carefully.   1. Shower the NIGHT BEFORE SURGERY and the MORNING OF SURGERY with CHG Soap.   2. If you chose to wash your hair, wash your hair first as usual with your normal shampoo.  3. After you shampoo, rinse your hair and body thoroughly to remove the shampoo.  4. Use CHG as you would any other liquid soap. You can apply CHG directly to the skin and wash gently with a scrungie or a clean washcloth.   5. Apply the CHG Soap to your body ONLY FROM THE NECK DOWN.  Do not use on open wounds or open sores. Avoid contact with your eyes, ears, mouth and genitals (private parts). Wash Face and genitals (private parts)  with your normal soap.   6. Wash thoroughly, paying special attention to the area where your surgery will be performed.  7. Thoroughly rinse your body with warm water from the neck down.  8. DO NOT  shower/wash with your normal soap after using and rinsing off the CHG Soap.  9. Pat yourself dry with a CLEAN TOWEL.  10. Wear CLEAN PAJAMAS to bed the night before surgery, wear comfortable clothes the morning of surgery  11. Place CLEAN SHEETS on your bed the night of your first shower and DO NOT SLEEP WITH PETS.    Day of Surgery:  Do not apply any deodorants/lotions. Please shower the morning of surgery with the CHG soap  Please wear clean clothes to the hospital/surgery center.   Remember to brush your teeth WITH YOUR REGULAR TOOTHPASTE.   Please read over the following fact sheets that you were given.

## 2019-07-08 NOTE — Progress Notes (Signed)
[id number] - [place], [place] - [address] [place] [address] Phone: [phone] Fax: [phone]  [id number] - [place], [place]  [address] Phone: [phone] Fax: [phone]      Your procedure is scheduled on Thursday, September 24th, 2020.  Report to [facility] Main Entrance "A" at 5:30 A.M., and check in at the Admitting office.   Call this number if you have problems the morning of surgery:  [phone]  Call [phone] if you have any questions prior to your surgery date Monday-Friday 8am-4pm     Remember:  Do not eat or drink after midnight the night before your surgery    Take these medicines the morning of surgery with A SIP OF WATER :  Atorvastatin (Lipitor)  Omeprazole (Prilosec)  Polyethyl Glycol-Propyl Glycol (Systane OP)  Baclofen (Lioresal) - if needed  7 days prior to surgery STOP taking any Aspirin (unless otherwise instructed by your surgeon), Aleve, Naproxen, Ibuprofen, Motrin, Advil, Goody's, BC's, all herbal medications, fish oil, and all vitamins.  WHAT DO I DO ABOUT MY DIABETES MEDICATION?  Marland Kitchen Do not take oral diabetes medicines (pills) the morning of surgery. - Glimepiride (Amaryl), or Metformin   How to Manage Your Diabetes Before and After Surgery  Why is it important to control my blood sugar before and after surgery? . Improving blood sugar levels before and after surgery helps healing and can limit problems. . A way of improving blood sugar control is eating a healthy diet by: o  Eating less sugar and carbohydrates o  Increasing activity/exercise o  Talking with your doctor about reaching your blood sugar goals . High blood sugars (greater than 180 mg/dL) can raise your risk of infections and slow your recovery, so you will need to focus on controlling your diabetes during the weeks before surgery. . Make sure that the doctor who  takes care of your diabetes knows about your planned surgery including the date and location.  How do I manage my blood sugar before surgery? . Check your blood sugar at least 4 times a day, starting 2 days before surgery, to make sure that the level is not too high or low. o Check your blood sugar the morning of your surgery when you wake up and every 2 hours until you get to the Short Stay unit. . If your blood sugar is less than 70 mg/dL, you will need to treat for low blood sugar: o Do not take insulin. o Treat a low blood sugar (less than 70 mg/dL) with  cup of clear juice (cranberry or apple), 4 glucose tablets, OR glucose gel. o Recheck blood sugar in 15 minutes after treatment (to make sure it is greater than 70 mg/dL). If your blood sugar is not greater than 70 mg/dL on recheck, call [phone] for further instructions. . Report your blood sugar to the short stay nurse when you get to Short Stay.  . If you are admitted to the hospital after surgery: o Your blood sugar will be checked by the staff and you will probably be given insulin after surgery (instead of oral diabetes medicines) to make sure you have good blood sugar levels. o The goal for blood sugar control after surgery is 80-180 mg/dL   The Morning of Surgery  Do not wear jewelry, make-up or nail polish.  Do not wear lotions, powders, or perfume, or deodorant  Do not  shave 48 hours prior to surgery.    Do not bring valuables to the hospital.  [facility] is not responsible for any belongings or valuables.  If you are a smoker, DO NOT Smoke 24 hours prior to surgery IF you wear a CPAP at night please bring your mask, tubing, and machine the morning of surgery   Remember that you must have someone to transport you home after your surgery, and remain with you for 24 hours if you are discharged the same day.   Contacts, glasses, hearing aids, dentures or bridgework may not be worn into surgery.    Leave your suitcase  in the car.  After surgery it may be brought to your room.  For patients admitted to the hospital, discharge time will be determined by your treatment team.  Patients discharged the day of surgery will not be allowed to drive home.    Special instructions:   [place]- Preparing For Surgery  Before surgery, you can play an important role. Because skin is not sterile, your skin needs to be as free of germs as possible. You can reduce the number of germs on your skin by washing with CHG (chlorahexidine gluconate) Soap before surgery.  CHG is an antiseptic cleaner which kills germs and bonds with the skin to continue killing germs even after washing.    Oral Hygiene is also important to reduce your risk of infection.  Remember - BRUSH YOUR TEETH THE MORNING OF SURGERY WITH YOUR REGULAR TOOTHPASTE  Please do not use if you have an allergy to CHG or antibacterial soaps. If your skin becomes reddened/irritated stop using the CHG.  Do not shave (including legs and underarms) for at least 48 hours prior to first CHG shower. It is OK to shave your face.  Please follow these instructions carefully.   1. Shower the NIGHT BEFORE SURGERY and the MORNING OF SURGERY with CHG Soap.   2. If you chose to wash your hair, wash your hair first as usual with your normal shampoo.  3. After you shampoo, rinse your hair and body thoroughly to remove the shampoo.  4. Use CHG as you would any other liquid soap. You can apply CHG directly to the skin and wash gently with a scrungie or a clean washcloth.   5. Apply the CHG Soap to your body ONLY FROM THE NECK DOWN.  Do not use on open wounds or open sores. Avoid contact with your eyes, ears, mouth and genitals (private parts). Wash Face and genitals (private parts)  with your normal soap.   6. Wash thoroughly, paying special attention to the area where your surgery will be performed.  7. Thoroughly rinse your body with warm water from the neck down.  8. DO NOT  shower/wash with your normal soap after using and rinsing off the CHG Soap.  9. Pat yourself dry with a CLEAN TOWEL.  10. Wear CLEAN PAJAMAS to bed the night before surgery, wear comfortable clothes the morning of surgery  11. Place CLEAN SHEETS on your bed the night of your first shower and DO NOT SLEEP WITH PETS.    Day of Surgery:  Do not apply any deodorants/lotions. Please shower the morning of surgery with the CHG soap  Please wear clean clothes to the hospital/surgery center.   Remember to brush your teeth WITH YOUR REGULAR TOOTHPASTE.   Please read over the following fact sheets that you were given.

## 2019-07-11 ENCOUNTER — Other Ambulatory Visit (HOSPITAL_COMMUNITY)
Admission: RE | Admit: 2019-07-11 | Discharge: 2019-07-11 | Disposition: A | Discharge status: Home / Self Care | Payer: Medicare Other | Source: Ambulatory Visit | Attending: Neurological Surgery | Admitting: Neurological Surgery

## 2019-07-11 ENCOUNTER — Encounter (HOSPITAL_COMMUNITY): Payer: Self-pay

## 2019-07-11 ENCOUNTER — Other Ambulatory Visit: Payer: Self-pay

## 2019-07-11 ENCOUNTER — Encounter (HOSPITAL_COMMUNITY)
Admission: RE | Admit: 2019-07-11 | Discharge: 2019-07-11 | Disposition: A | Discharge status: Home / Self Care | Payer: Medicare Other | Source: Ambulatory Visit | Attending: Neurological Surgery | Admitting: Neurological Surgery

## 2019-07-11 DIAGNOSIS — Z87891 Personal history of nicotine dependence: Secondary | ICD-10-CM | POA: Insufficient documentation

## 2019-07-11 DIAGNOSIS — Z7982 Long term (current) use of aspirin: Secondary | ICD-10-CM | POA: Diagnosis not present

## 2019-07-11 DIAGNOSIS — Z20828 Contact with and (suspected) exposure to other viral communicable diseases: Secondary | ICD-10-CM | POA: Diagnosis not present

## 2019-07-11 DIAGNOSIS — Z7984 Long term (current) use of oral hypoglycemic drugs: Secondary | ICD-10-CM | POA: Diagnosis not present

## 2019-07-11 DIAGNOSIS — M5416 Radiculopathy, lumbar region: Secondary | ICD-10-CM | POA: Diagnosis not present

## 2019-07-11 DIAGNOSIS — Z79899 Other long term (current) drug therapy: Secondary | ICD-10-CM | POA: Diagnosis not present

## 2019-07-11 DIAGNOSIS — M48062 Spinal stenosis, lumbar region with neurogenic claudication: Secondary | ICD-10-CM | POA: Diagnosis not present

## 2019-07-11 DIAGNOSIS — E119 Type 2 diabetes mellitus without complications: Secondary | ICD-10-CM | POA: Insufficient documentation

## 2019-07-11 DIAGNOSIS — Z791 Long term (current) use of non-steroidal anti-inflammatories (NSAID): Secondary | ICD-10-CM | POA: Diagnosis not present

## 2019-07-11 DIAGNOSIS — R9431 Abnormal electrocardiogram [ECG] [EKG]: Secondary | ICD-10-CM | POA: Insufficient documentation

## 2019-07-11 DIAGNOSIS — K219 Gastro-esophageal reflux disease without esophagitis: Secondary | ICD-10-CM | POA: Diagnosis not present

## 2019-07-11 DIAGNOSIS — M4150 Other secondary scoliosis, site unspecified: Secondary | ICD-10-CM | POA: Diagnosis not present

## 2019-07-11 DIAGNOSIS — I10 Essential (primary) hypertension: Secondary | ICD-10-CM | POA: Insufficient documentation

## 2019-07-11 DIAGNOSIS — Z01818 Encounter for other preprocedural examination: Secondary | ICD-10-CM | POA: Insufficient documentation

## 2019-07-11 LAB — BASIC METABOLIC PANEL
Anion gap: 11 (ref 5–15)
BUN: 23 mg/dL (ref 8–23)
CO2: 22 mmol/L (ref 22–32)
Calcium: 9.7 mg/dL (ref 8.9–10.3)
Chloride: 106 mmol/L (ref 98–111)
Creatinine, Ser: 1.17 mg/dL — ABNORMAL HIGH (ref 0.44–1.00)
GFR calc Af Amer: 56 mL/min — ABNORMAL LOW (ref >60)
GFR calc non Af Amer: 48 mL/min — ABNORMAL LOW (ref >60)
Glucose, Bld: 138 mg/dL — ABNORMAL HIGH (ref 70–99)
Potassium: 5 mmol/L (ref 3.5–5.1)
Sodium: 139 mmol/L (ref 135–145)

## 2019-07-11 LAB — HEMOGLOBIN A1C
Hgb A1c MFr Bld: 6.8 % — ABNORMAL HIGH (ref 4.8–5.6)
Mean Plasma Glucose: 148.46 mg/dL

## 2019-07-11 LAB — CBC
HCT: 40.9 % (ref 36.0–46.0)
Hemoglobin: 13.6 g/dL (ref 12.0–15.0)
MCH: 30.6 pg (ref 26.0–34.0)
MCHC: 33.3 g/dL (ref 30.0–36.0)
MCV: 92.1 fL (ref 80.0–100.0)
Platelets: 269 10*3/uL (ref 150–400)
RBC: 4.44 MIL/uL (ref 3.87–5.11)
RDW: 13.1 % (ref 11.5–15.5)
WBC: 6.2 10*3/uL (ref 4.0–10.5)
nRBC: 0 % (ref 0.0–0.2)

## 2019-07-11 LAB — TYPE AND SCREEN
ABO/RH(D): A POS
Antibody Screen: NEGATIVE

## 2019-07-11 LAB — SURGICAL PCR SCREEN
MRSA, PCR: NEGATIVE
Staphylococcus aureus: NEGATIVE

## 2019-07-11 LAB — GLUCOSE, CAPILLARY: Glucose-Capillary: 131 mg/dL — ABNORMAL HIGH (ref 70–99)

## 2019-07-11 NOTE — Progress Notes (Signed)
PCP - [facility], [place] Cardiologist - denies  Chest x-ray - not needed EKG - 07/11/19 Stress Test - denies ECHO - denies Cardiac Cath - 10/29/2004  Fasting Blood Sugar - 110-130s Checks Blood Sugar _1____ times a day  Aspirin Instructions: LD 9/17  COVID TEST- today 9/21   Anesthesia review: yes, hx mi in 2006  Patient denies shortness of breath, fever, cough and chest pain at PAT appointment   Patient verbalized understanding of instructions that were given to them at the PAT appointment. Patient was also instructed that they will need to review over the PAT instructions again at home before surgery.

## 2019-07-11 NOTE — Progress Notes (Signed)
[id number] - [place], [place] - [address] [place] [address] Phone: [phone] Fax: [phone]  [id number] - [place], [place] [address] Phone: [phone] Fax: [phone]      Your procedure is scheduled on Thursday, September 24th, 2020.  Report to [facility] Main Entrance "A" at 5:30 A.M., and check in at the Admitting office.   Call this number if you have problems the morning of surgery:  [phone]  Call [phone] if you have any questions prior to your surgery date Monday-Friday 8am-4pm     Remember:  Do not eat or drink after midnight the night before your surgery    Take these medicines the morning of surgery with A SIP OF WATER : Atorvastatin (Lipitor) baclofen (LIORESAL) if needed Omeprazole (Prilosec) Polyethyl Glycol-Propyl Glycol (Systane OP)  Follow your surgeon's instructions on when to stop Aspirin.  If no instructions were given by your surgeon then you will need to call the office to get those instructions.    7 days prior to surgery STOP taking any Aspirin (unless otherwise instructed by your surgeon), Aleve, Naproxen, Ibuprofen, Motrin, Advil, Goody's, BC's, all herbal medications, fish oil, and all vitamins.  WHAT DO I DO ABOUT MY DIABETES MEDICATION?  Marland Kitchen Do not take oral diabetes medicines (pills) the morning of surgery. - glimepiride (AMARYL or metFORMIN (GLUCOPHAGE   How to Manage Your Diabetes Before and After Surgery  Why is it important to control my blood sugar before and after surgery? . Improving blood sugar levels before and after surgery helps healing and can limit problems. . A way of improving blood sugar control is eating a healthy diet by: o  Eating less sugar and carbohydrates o  Increasing activity/exercise o  Talking with your doctor about reaching your blood sugar goals . High blood sugars (greater than 180 mg/dL)  can raise your risk of infections and slow your recovery, so you will need to focus on controlling your diabetes during the weeks before surgery. . Make sure that the doctor who takes care of your diabetes knows about your planned surgery including the date and location.  How do I manage my blood sugar before surgery? . Check your blood sugar at least 4 times a day, starting 2 days before surgery, to make sure that the level is not too high or low. o Check your blood sugar the morning of your surgery when you wake up and every 2 hours until you get to the Short Stay unit. . If your blood sugar is less than 70 mg/dL, you will need to treat for low blood sugar: o Do not take insulin. o Treat a low blood sugar (less than 70 mg/dL) with  cup of clear juice (cranberry or apple), 4 glucose tablets, OR glucose gel. o Recheck blood sugar in 15 minutes after treatment (to make sure it is greater than 70 mg/dL). If your blood sugar is not greater than 70 mg/dL on recheck, call [phone] for further instructions. . Report your blood sugar to the short stay nurse when you get to Short Stay.  . If you are admitted to the hospital after surgery: o Your blood sugar will be checked by the staff and you will probably be given insulin after surgery (instead of oral diabetes medicines) to make sure you have good blood sugar levels. o The goal for blood sugar control after surgery is 80-180  mg/dL   The Morning of Surgery  Do not wear jewelry, make-up or nail polish.  Do not wear lotions, powders, or perfume, or deodorant  Do not shave 48 hours prior to surgery.    Do not bring valuables to the hospital.  [facility] is not responsible for any belongings or valuables.  If you are a smoker, DO NOT Smoke 24 hours prior to surgery IF you wear a CPAP at night please bring your mask, tubing, and machine the morning of surgery   Remember that you must have someone to transport you home after your surgery, and  remain with you for 24 hours if you are discharged the same day.   Contacts, glasses, hearing aids, dentures or bridgework may not be worn into surgery.    Leave your suitcase in the car.  After surgery it may be brought to your room.  For patients admitted to the hospital, discharge time will be determined by your treatment team.  Patients discharged the day of surgery will not be allowed to drive home.    Special instructions:   [place]- Preparing For Surgery  Before surgery, you can play an important role. Because skin is not sterile, your skin needs to be as free of germs as possible. You can reduce the number of germs on your skin by washing with CHG (chlorahexidine gluconate) Soap before surgery.  CHG is an antiseptic cleaner which kills germs and bonds with the skin to continue killing germs even after washing.    Oral Hygiene is also important to reduce your risk of infection.  Remember - BRUSH YOUR TEETH THE MORNING OF SURGERY WITH YOUR REGULAR TOOTHPASTE  Please do not use if you have an allergy to CHG or antibacterial soaps. If your skin becomes reddened/irritated stop using the CHG.  Do not shave (including legs and underarms) for at least 48 hours prior to first CHG shower. It is OK to shave your face.  Please follow these instructions carefully.   1. Shower the NIGHT BEFORE SURGERY and the MORNING OF SURGERY with CHG Soap.   2. If you chose to wash your hair, wash your hair first as usual with your normal shampoo.  3. After you shampoo, rinse your hair and body thoroughly to remove the shampoo.  4. Use CHG as you would any other liquid soap. You can apply CHG directly to the skin and wash gently with a scrungie or a clean washcloth.   5. Apply the CHG Soap to your body ONLY FROM THE NECK DOWN.  Do not use on open wounds or open sores. Avoid contact with your eyes, ears, mouth and genitals (private parts). Wash Face and genitals (private parts)  with your normal  soap.   6. Wash thoroughly, paying special attention to the area where your surgery will be performed.  7. Thoroughly rinse your body with warm water from the neck down.  8. DO NOT shower/wash with your normal soap after using and rinsing off the CHG Soap.  9. Pat yourself dry with a CLEAN TOWEL.  10. Wear CLEAN PAJAMAS to bed the night before surgery, wear comfortable clothes the morning of surgery  11. Place CLEAN SHEETS on your bed the night of your first shower and DO NOT SLEEP WITH PETS.    Day of Surgery:  Do not apply any deodorants/lotions. Please shower the morning of surgery with the CHG soap  Please wear clean clothes to the hospital/surgery center.   Remember to brush  your teeth WITH YOUR REGULAR TOOTHPASTE.   Please read over the following fact sheets that you were given.

## 2019-07-12 NOTE — Anesthesia Preprocedure Evaluation (Addendum)
Anesthesia Evaluation  Patient identified by MRN, date of birth, ID band Patient awake    Reviewed: Allergy & Precautions, NPO status , Patient's Chart, lab work & pertinent test results  Airway Mallampati: II  TM Distance: >3 FB Neck ROM: Full    Dental no notable dental hx.    Pulmonary neg pulmonary ROS, former smoker,    Pulmonary exam normal breath sounds clear to auscultation       Cardiovascular + angina Normal cardiovascular exam Rhythm:Regular Rate:Normal     Neuro/Psych negative neurological ROS  negative psych ROS   GI/Hepatic Neg liver ROS, GERD  ,  Endo/Other  diabetes  Renal/GU negative Renal ROS  negative genitourinary   Musculoskeletal negative musculoskeletal ROS (+)   Abdominal   Peds negative pediatric ROS (+)  Hematology negative hematology ROS (+)   Anesthesia Other Findings   Reproductive/Obstetrics negative OB ROS                            Anesthesia Physical Anesthesia Plan  ASA: II  Anesthesia Plan: General   Post-op Pain Management:    Induction: Intravenous  PONV Risk Score and Plan: 3 and Ondansetron, Dexamethasone and Treatment may vary due to age or medical condition  Airway Management Planned: Oral ETT  Additional Equipment:   Intra-op Plan:   Post-operative Plan: Extubation in OR  Informed Consent: I have reviewed the patients History and Physical, chart, labs and discussed the procedure including the risks, benefits and alternatives for the proposed anesthesia with the patient or authorized representative who has indicated his/her understanding and acceptance.     Dental advisory given  Plan Discussed with: CRNA  Anesthesia Plan Comments: (Pt had an admission in 2006 for chest pain and elevated troponin. She underwent a cath that showed normal coronaries. Elevated enzymes thought possibly due to coronary vasospam and per notes if the pt  had additional episodes she would be started on a calcium channel blocker. I spoke with the patient and she said she has never had another episode of chest pain and she denies any CV symptoms, no angina, no SOB. She has no other significant cardiac history. She did mention that after her THA in 2013 she had severe PONV.  Cath 2006 FINAL IMPRESSION:  1.  Normal coronary angiography. Normal single plane ventriculogram.      Ejection fraction 65%.  2.  Elevation in cardiac enzymes possibly from coronary spasm. Will monitor      at this time. May need to consider calcium-channel blockers should the      patient have recurrence. We will also obtain repeat cardiac enzymes in      two weeks to determine if the patient is someone who has a false      positive elevation at baseline. )   Anesthesia Quick Evaluation

## 2019-07-13 LAB — NOVEL CORONAVIRUS, NAA (HOSP ORDER, SEND-OUT TO REF LAB; TAT 18-24 HRS): SARS-CoV-2, NAA: NOT DETECTED

## 2019-07-14 ENCOUNTER — Encounter (HOSPITAL_COMMUNITY)
Admission: RE | Disposition: A | Discharge status: Home / Self Care | Payer: Self-pay | Source: Home / Self Care | Attending: Neurological Surgery

## 2019-07-14 ENCOUNTER — Other Ambulatory Visit: Payer: Self-pay

## 2019-07-14 ENCOUNTER — Inpatient Hospital Stay (HOSPITAL_COMMUNITY): Payer: Medicare Other | Admitting: Physician Assistant

## 2019-07-14 ENCOUNTER — Inpatient Hospital Stay (HOSPITAL_COMMUNITY): Payer: Medicare Other | Admitting: Anesthesiology

## 2019-07-14 ENCOUNTER — Encounter (HOSPITAL_COMMUNITY): Payer: Self-pay | Admitting: *Deleted

## 2019-07-14 ENCOUNTER — Inpatient Hospital Stay (HOSPITAL_COMMUNITY)
Admission: RE | Admit: 2019-07-14 | Discharge: 2019-07-15 | DRG: 458 | Disposition: A | Discharge status: Home / Self Care | Payer: Medicare Other | Attending: Neurological Surgery | Admitting: Neurological Surgery

## 2019-07-14 ENCOUNTER — Inpatient Hospital Stay (HOSPITAL_COMMUNITY): Payer: Medicare Other

## 2019-07-14 DIAGNOSIS — Z87891 Personal history of nicotine dependence: Secondary | ICD-10-CM

## 2019-07-14 DIAGNOSIS — M5416 Radiculopathy, lumbar region: Secondary | ICD-10-CM | POA: Diagnosis not present

## 2019-07-14 DIAGNOSIS — K219 Gastro-esophageal reflux disease without esophagitis: Secondary | ICD-10-CM | POA: Diagnosis not present

## 2019-07-14 DIAGNOSIS — Z20828 Contact with and (suspected) exposure to other viral communicable diseases: Secondary | ICD-10-CM | POA: Diagnosis not present

## 2019-07-14 DIAGNOSIS — Z791 Long term (current) use of non-steroidal anti-inflammatories (NSAID): Secondary | ICD-10-CM

## 2019-07-14 DIAGNOSIS — E119 Type 2 diabetes mellitus without complications: Secondary | ICD-10-CM | POA: Diagnosis not present

## 2019-07-14 DIAGNOSIS — Z7982 Long term (current) use of aspirin: Secondary | ICD-10-CM | POA: Diagnosis not present

## 2019-07-14 DIAGNOSIS — Z7984 Long term (current) use of oral hypoglycemic drugs: Secondary | ICD-10-CM

## 2019-07-14 DIAGNOSIS — Z79899 Other long term (current) drug therapy: Secondary | ICD-10-CM

## 2019-07-14 DIAGNOSIS — M48062 Spinal stenosis, lumbar region with neurogenic claudication: Principal | ICD-10-CM | POA: Diagnosis present

## 2019-07-14 DIAGNOSIS — Z981 Arthrodesis status: Secondary | ICD-10-CM | POA: Diagnosis not present

## 2019-07-14 DIAGNOSIS — M545 Low back pain: Secondary | ICD-10-CM | POA: Diagnosis not present

## 2019-07-14 DIAGNOSIS — M4150 Other secondary scoliosis, site unspecified: Secondary | ICD-10-CM | POA: Diagnosis present

## 2019-07-14 DIAGNOSIS — Z419 Encounter for procedure for purposes other than remedying health state, unspecified: Secondary | ICD-10-CM

## 2019-07-14 DIAGNOSIS — M48061 Spinal stenosis, lumbar region without neurogenic claudication: Secondary | ICD-10-CM | POA: Diagnosis not present

## 2019-07-14 DIAGNOSIS — M4186 Other forms of scoliosis, lumbar region: Secondary | ICD-10-CM | POA: Diagnosis not present

## 2019-07-14 DIAGNOSIS — M4326 Fusion of spine, lumbar region: Secondary | ICD-10-CM | POA: Diagnosis not present

## 2019-07-14 HISTORY — PX: ANTERIOR LAT LUMBAR FUSION: SHX1168

## 2019-07-14 LAB — GLUCOSE, CAPILLARY
Glucose-Capillary: 136 mg/dL — ABNORMAL HIGH (ref 70–99)
Glucose-Capillary: 144 mg/dL — ABNORMAL HIGH (ref 70–99)
Glucose-Capillary: 223 mg/dL — ABNORMAL HIGH (ref 70–99)
Glucose-Capillary: 200 mg/dL — ABNORMAL HIGH (ref 70–99)
Glucose-Capillary: 153 mg/dL — ABNORMAL HIGH (ref 70–99)

## 2019-07-14 SURGERY — ANTERIOR LATERAL LUMBAR FUSION 1 LEVEL
Anesthesia: General | Site: Spine Lumbar | Laterality: Left

## 2019-07-14 MED ORDER — INSULIN ASPART 100 UNIT/ML ~~LOC~~ SOLN
0.0000 [IU] | Freq: Three times a day (TID) | SUBCUTANEOUS | Status: DC
  Administered 2019-07-14: 7 [IU] via SUBCUTANEOUS
  Administered 2019-07-14: 4 [IU] via SUBCUTANEOUS
  Administered 2019-07-15: 07:00:00 3 [IU] via SUBCUTANEOUS

## 2019-07-14 MED ORDER — LIDOCAINE 2% (20 MG/ML) 5 ML SYRINGE
INTRAMUSCULAR | Status: DC | PRN
  Administered 2019-07-14: 100 mg via INTRAVENOUS

## 2019-07-14 MED ORDER — FENTANYL CITRATE (PF) 100 MCG/2ML IJ SOLN
INTRAMUSCULAR | Status: DC | PRN
  Administered 2019-07-14 (×5): 50 ug via INTRAVENOUS

## 2019-07-14 MED ORDER — HYDROMORPHONE HCL 1 MG/ML IJ SOLN
INTRAMUSCULAR | Status: AC
  Filled 2019-07-14: qty 1

## 2019-07-14 MED ORDER — PROPOFOL 10 MG/ML IV BOLUS
INTRAVENOUS | Status: AC
  Filled 2019-07-14: qty 20

## 2019-07-14 MED ORDER — SODIUM CHLORIDE 0.9% FLUSH
3.0000 mL | INTRAVENOUS | Status: DC | PRN
  Administered 2019-07-14: 3 mL via INTRAVENOUS
  Filled 2019-07-14: qty 3

## 2019-07-14 MED ORDER — FLEET ENEMA 7-19 GM/118ML RE ENEM: 1.0000 | ENEMA | Freq: Once | RECTAL | Status: DC | PRN

## 2019-07-14 MED ORDER — DOCUSATE SODIUM 100 MG PO CAPS
100.0000 mg | ORAL_CAPSULE | Freq: Two times a day (BID) | ORAL | Status: DC
  Administered 2019-07-14 – 2019-07-15 (×3): 100 mg via ORAL
  Filled 2019-07-14 (×3): qty 1

## 2019-07-14 MED ORDER — PROMETHAZINE HCL 25 MG/ML IJ SOLN: 6.2500 mg | INTRAMUSCULAR | Status: DC | PRN

## 2019-07-14 MED ORDER — SODIUM CHLORIDE 0.9 % IV SOLN
INTRAVENOUS | Status: DC | PRN
  Administered 2019-07-14: 500 mL

## 2019-07-14 MED ORDER — ACETAMINOPHEN 650 MG RE SUPP: 650.0000 mg | RECTAL | Status: DC | PRN

## 2019-07-14 MED ORDER — THROMBIN 5000 UNITS EX SOLR
OROMUCOSAL | Status: DC | PRN
  Administered 2019-07-14: 5 mL via TOPICAL

## 2019-07-14 MED ORDER — PROPOFOL 500 MG/50ML IV EMUL
INTRAVENOUS | Status: DC | PRN
  Administered 2019-07-14: 50 ug/kg/min via INTRAVENOUS

## 2019-07-14 MED ORDER — ATORVASTATIN CALCIUM 10 MG PO TABS
20.0000 mg | ORAL_TABLET | Freq: Every day | ORAL | Status: DC
  Administered 2019-07-14 – 2019-07-15 (×2): 20 mg via ORAL
  Filled 2019-07-14 (×2): qty 2

## 2019-07-14 MED ORDER — PROPOFOL 10 MG/ML IV BOLUS
INTRAVENOUS | Status: DC | PRN
  Administered 2019-07-14: 120 mg via INTRAVENOUS

## 2019-07-14 MED ORDER — CHLORHEXIDINE GLUCONATE CLOTH 2 % EX PADS: 6.0000 | MEDICATED_PAD | Freq: Once | CUTANEOUS | Status: DC

## 2019-07-14 MED ORDER — LACTATED RINGERS IV SOLN
INTRAVENOUS | Status: DC | PRN
  Administered 2019-07-14: 07:00:00 via INTRAVENOUS

## 2019-07-14 MED ORDER — CEFAZOLIN SODIUM-DEXTROSE 2-4 GM/100ML-% IV SOLN
2.0000 g | Freq: Three times a day (TID) | INTRAVENOUS | Status: AC
  Administered 2019-07-14 (×2): 2 g via INTRAVENOUS
  Filled 2019-07-14 (×2): qty 100

## 2019-07-14 MED ORDER — LISINOPRIL 10 MG PO TABS
10.0000 mg | ORAL_TABLET | Freq: Every day | ORAL | Status: DC
  Administered 2019-07-14 – 2019-07-15 (×2): 10 mg via ORAL
  Filled 2019-07-14 (×2): qty 1

## 2019-07-14 MED ORDER — HYDROMORPHONE HCL 1 MG/ML IJ SOLN
0.2500 mg | INTRAMUSCULAR | Status: DC | PRN
  Administered 2019-07-14: 10:00:00 0.5 mg via INTRAVENOUS
  Administered 2019-07-14: 0.25 mg via INTRAVENOUS

## 2019-07-14 MED ORDER — SCOPOLAMINE 1 MG/3DAYS TD PT72
MEDICATED_PATCH | TRANSDERMAL | Status: AC
  Filled 2019-07-14: qty 1

## 2019-07-14 MED ORDER — CEFAZOLIN SODIUM-DEXTROSE 2-4 GM/100ML-% IV SOLN
2.0000 g | INTRAVENOUS | Status: AC
  Administered 2019-07-14: 2 g via INTRAVENOUS

## 2019-07-14 MED ORDER — SUCCINYLCHOLINE CHLORIDE 20 MG/ML IJ SOLN
INTRAMUSCULAR | Status: DC | PRN
  Administered 2019-07-14: 80 mg via INTRAVENOUS

## 2019-07-14 MED ORDER — METHOCARBAMOL 500 MG PO TABS
ORAL_TABLET | ORAL | Status: AC
  Filled 2019-07-14: qty 1

## 2019-07-14 MED ORDER — BUPIVACAINE HCL (PF) 0.5 % IJ SOLN
INTRAMUSCULAR | Status: AC
  Filled 2019-07-14: qty 30

## 2019-07-14 MED ORDER — DIAZEPAM 5 MG/ML IJ SOLN
5.0000 mg | INTRAMUSCULAR | Status: AC
  Administered 2019-07-14: 5 mg via INTRAVENOUS
  Filled 2019-07-14: qty 2

## 2019-07-14 MED ORDER — MIDAZOLAM HCL 2 MG/2ML IJ SOLN
INTRAMUSCULAR | Status: AC
  Filled 2019-07-14: qty 2

## 2019-07-14 MED ORDER — POLYETHYLENE GLYCOL 3350 17 G PO PACK: 17.0000 g | PACK | Freq: Every day | ORAL | Status: DC | PRN

## 2019-07-14 MED ORDER — SODIUM CHLORIDE 0.9 % IV SOLN: 250.0000 mL | INTRAVENOUS | Status: DC

## 2019-07-14 MED ORDER — FENTANYL CITRATE (PF) 250 MCG/5ML IJ SOLN
INTRAMUSCULAR | Status: AC
  Filled 2019-07-14: qty 5

## 2019-07-14 MED ORDER — PANTOPRAZOLE SODIUM 40 MG PO TBEC
40.0000 mg | DELAYED_RELEASE_TABLET | Freq: Every day | ORAL | Status: DC
  Administered 2019-07-14 – 2019-07-15 (×2): 40 mg via ORAL
  Filled 2019-07-14 (×2): qty 1

## 2019-07-14 MED ORDER — SODIUM CHLORIDE 0.9% FLUSH: 3.0000 mL | Freq: Two times a day (BID) | INTRAVENOUS | Status: DC

## 2019-07-14 MED ORDER — PHENOL 1.4 % MT LIQD: 1.0000 | OROMUCOSAL | Status: DC | PRN

## 2019-07-14 MED ORDER — DEXAMETHASONE SODIUM PHOSPHATE 10 MG/ML IJ SOLN
INTRAMUSCULAR | Status: DC | PRN
  Administered 2019-07-14: 10 mg via INTRAVENOUS

## 2019-07-14 MED ORDER — THROMBIN 5000 UNITS EX SOLR
CUTANEOUS | Status: AC
  Filled 2019-07-14: qty 5000

## 2019-07-14 MED ORDER — LIDOCAINE 2% (20 MG/ML) 5 ML SYRINGE
INTRAMUSCULAR | Status: AC
  Filled 2019-07-14: qty 5

## 2019-07-14 MED ORDER — KETOROLAC TROMETHAMINE 15 MG/ML IJ SOLN
7.5000 mg | Freq: Four times a day (QID) | INTRAMUSCULAR | Status: DC
  Administered 2019-07-14: 7.5 mg via INTRAVENOUS

## 2019-07-14 MED ORDER — METHOCARBAMOL 500 MG PO TABS
500.0000 mg | ORAL_TABLET | Freq: Four times a day (QID) | ORAL | Status: DC | PRN
  Administered 2019-07-14 – 2019-07-15 (×4): 500 mg via ORAL
  Filled 2019-07-14 (×3): qty 1

## 2019-07-14 MED ORDER — KETOROLAC TROMETHAMINE 15 MG/ML IJ SOLN
7.5000 mg | Freq: Four times a day (QID) | INTRAMUSCULAR | Status: AC
  Administered 2019-07-14 – 2019-07-15 (×4): 7.5 mg via INTRAVENOUS
  Filled 2019-07-14 (×4): qty 1

## 2019-07-14 MED ORDER — ONDANSETRON HCL 4 MG/2ML IJ SOLN
INTRAMUSCULAR | Status: AC
  Filled 2019-07-14: qty 2

## 2019-07-14 MED ORDER — LIDOCAINE-EPINEPHRINE 1 %-1:100000 IJ SOLN
INTRAMUSCULAR | Status: AC
  Filled 2019-07-14: qty 1

## 2019-07-14 MED ORDER — OXYCODONE-ACETAMINOPHEN 5-325 MG PO TABS
1.0000 | ORAL_TABLET | ORAL | Status: DC | PRN
  Administered 2019-07-14: 2 via ORAL
  Administered 2019-07-15 (×2): 1 via ORAL
  Filled 2019-07-14: qty 1
  Filled 2019-07-14 (×2): qty 2

## 2019-07-14 MED ORDER — PROPOFOL 1000 MG/100ML IV EMUL
INTRAVENOUS | Status: AC
  Filled 2019-07-14: qty 100

## 2019-07-14 MED ORDER — MENTHOL 3 MG MT LOZG: 1.0000 | LOZENGE | OROMUCOSAL | Status: DC | PRN

## 2019-07-14 MED ORDER — ACETAMINOPHEN 325 MG PO TABS: 650.0000 mg | ORAL_TABLET | ORAL | Status: DC | PRN

## 2019-07-14 MED ORDER — METHOCARBAMOL 1000 MG/10ML IJ SOLN
500.0000 mg | Freq: Four times a day (QID) | INTRAVENOUS | Status: DC | PRN
  Filled 2019-07-14: qty 5

## 2019-07-14 MED ORDER — MIDAZOLAM HCL 5 MG/5ML IJ SOLN
INTRAMUSCULAR | Status: DC | PRN
  Administered 2019-07-14: 2 mg via INTRAVENOUS

## 2019-07-14 MED ORDER — POLYVINYL ALCOHOL 1.4 % OP SOLN
1.0000 [drp] | Freq: Two times a day (BID) | OPHTHALMIC | Status: DC
  Administered 2019-07-14: 1 [drp] via OPHTHALMIC
  Filled 2019-07-14: qty 15

## 2019-07-14 MED ORDER — GLIMEPIRIDE 2 MG PO TABS
4.0000 mg | ORAL_TABLET | Freq: Every day | ORAL | Status: DC
  Administered 2019-07-15: 07:00:00 4 mg via ORAL
  Filled 2019-07-14: qty 2

## 2019-07-14 MED ORDER — 0.9 % SODIUM CHLORIDE (POUR BTL) OPTIME
TOPICAL | Status: DC | PRN
  Administered 2019-07-14: 1000 mL

## 2019-07-14 MED ORDER — DEXAMETHASONE SODIUM PHOSPHATE 10 MG/ML IJ SOLN
INTRAMUSCULAR | Status: AC
  Filled 2019-07-14: qty 1

## 2019-07-14 MED ORDER — CEFAZOLIN SODIUM-DEXTROSE 2-4 GM/100ML-% IV SOLN
INTRAVENOUS | Status: AC
  Filled 2019-07-14: qty 100

## 2019-07-14 MED ORDER — ONDANSETRON HCL 4 MG/2ML IJ SOLN: 4.0000 mg | Freq: Four times a day (QID) | INTRAMUSCULAR | Status: DC | PRN

## 2019-07-14 MED ORDER — ALUM & MAG HYDROXIDE-SIMETH 200-200-20 MG/5ML PO SUSP: 30.0000 mL | Freq: Four times a day (QID) | ORAL | Status: DC | PRN

## 2019-07-14 MED ORDER — LIDOCAINE-EPINEPHRINE 1 %-1:100000 IJ SOLN
INTRAMUSCULAR | Status: DC | PRN
  Administered 2019-07-14: 5 mL

## 2019-07-14 MED ORDER — SCOPOLAMINE 1 MG/3DAYS TD PT72
MEDICATED_PATCH | TRANSDERMAL | Status: DC | PRN
  Administered 2019-07-14: 1 via TRANSDERMAL

## 2019-07-14 MED ORDER — SUCCINYLCHOLINE CHLORIDE 200 MG/10ML IV SOSY
PREFILLED_SYRINGE | INTRAVENOUS | Status: AC
  Filled 2019-07-14: qty 10

## 2019-07-14 MED ORDER — SENNA 8.6 MG PO TABS
1.0000 | ORAL_TABLET | Freq: Two times a day (BID) | ORAL | Status: DC
  Administered 2019-07-14 – 2019-07-15 (×3): 8.6 mg via ORAL
  Filled 2019-07-14 (×3): qty 1

## 2019-07-14 MED ORDER — KETOROLAC TROMETHAMINE 15 MG/ML IJ SOLN
INTRAMUSCULAR | Status: AC
  Filled 2019-07-14: qty 1

## 2019-07-14 MED ORDER — POLYETHYL GLYCOL-PROPYL GLYCOL 0.4-0.3 % OP GEL
Freq: Two times a day (BID) | OPHTHALMIC | Status: DC
  Filled 2019-07-14 (×2): qty 10

## 2019-07-14 MED ORDER — SODIUM CHLORIDE 0.9 % IV SOLN
INTRAVENOUS | Status: DC | PRN
  Administered 2019-07-14: 25 ug/min via INTRAVENOUS

## 2019-07-14 MED ORDER — METFORMIN HCL 500 MG PO TABS
1000.0000 mg | ORAL_TABLET | Freq: Two times a day (BID) | ORAL | Status: DC
  Administered 2019-07-14 – 2019-07-15 (×2): 1000 mg via ORAL
  Filled 2019-07-14 (×2): qty 2

## 2019-07-14 MED ORDER — ONDANSETRON HCL 4 MG/2ML IJ SOLN
INTRAMUSCULAR | Status: DC | PRN
  Administered 2019-07-14: 4 mg via INTRAVENOUS

## 2019-07-14 MED ORDER — ONDANSETRON HCL 4 MG PO TABS: 4.0000 mg | ORAL_TABLET | Freq: Four times a day (QID) | ORAL | Status: DC | PRN

## 2019-07-14 MED ORDER — EPHEDRINE SULFATE-NACL 50-0.9 MG/10ML-% IV SOSY
PREFILLED_SYRINGE | INTRAVENOUS | Status: DC | PRN
  Administered 2019-07-14: 10 mg via INTRAVENOUS

## 2019-07-14 MED ORDER — LACTATED RINGERS IV SOLN: INTRAVENOUS | Status: DC

## 2019-07-14 MED ORDER — BISACODYL 10 MG RE SUPP: 10.0000 mg | Freq: Every day | RECTAL | Status: DC | PRN

## 2019-07-14 MED ORDER — EPHEDRINE 5 MG/ML INJ
INTRAVENOUS | Status: AC
  Filled 2019-07-14: qty 10

## 2019-07-14 MED ORDER — BUPIVACAINE HCL (PF) 0.5 % IJ SOLN
INTRAMUSCULAR | Status: DC | PRN
  Administered 2019-07-14: 5 mL

## 2019-07-14 MED ORDER — MORPHINE SULFATE (PF) 2 MG/ML IV SOLN
2.0000 mg | INTRAVENOUS | Status: DC | PRN
  Administered 2019-07-14: 4 mg via INTRAVENOUS
  Filled 2019-07-14: qty 2

## 2019-07-14 SURGICAL SUPPLY — 46 items
ADH SKN CLS APL DERMABOND .7 (GAUZE/BANDAGES/DRESSINGS) ×1
BLADE CLIPPER SURG (BLADE) IMPLANT
BONE MATRIX OSTEOCEL PRO MED (Bone Implant) ×1 IMPLANT
COVER WAND RF STERILE (DRAPES) ×2 IMPLANT
DERMABOND ADVANCED (GAUZE/BANDAGES/DRESSINGS) ×1
DERMABOND ADVANCED .7 DNX12 (GAUZE/BANDAGES/DRESSINGS) ×1 IMPLANT
DRAPE C-ARM 42X72 X-RAY (DRAPES) ×2 IMPLANT
DRAPE C-ARMOR (DRAPES) ×2 IMPLANT
DRAPE LAPAROTOMY 100X72X124 (DRAPES) ×2 IMPLANT
DURAPREP 26ML APPLICATOR (WOUND CARE) ×2 IMPLANT
ELECT REM PT RETURN 9FT ADLT (ELECTROSURGICAL) ×2
ELECTRODE REM PT RTRN 9FT ADLT (ELECTROSURGICAL) ×1 IMPLANT
GAUZE 4X4 16PLY RFD (DISPOSABLE) IMPLANT
GLOVE BIOGEL PI IND STRL 7.0 (GLOVE) IMPLANT
GLOVE BIOGEL PI IND STRL 8.5 (GLOVE) ×1 IMPLANT
GLOVE BIOGEL PI INDICATOR 7.0 (GLOVE) ×2
GLOVE BIOGEL PI INDICATOR 8.5 (GLOVE) ×1
GLOVE ECLIPSE 8.5 STRL (GLOVE) ×2 IMPLANT
GLOVE EXAM NITRILE XL STR (GLOVE) IMPLANT
GLOVE SURG SS PI 7.5 STRL IVOR (GLOVE) ×5 IMPLANT
GOWN STRL REUS W/ TWL LRG LVL3 (GOWN DISPOSABLE) IMPLANT
GOWN STRL REUS W/ TWL XL LVL3 (GOWN DISPOSABLE) ×1 IMPLANT
GOWN STRL REUS W/TWL 2XL LVL3 (GOWN DISPOSABLE) ×2 IMPLANT
GOWN STRL REUS W/TWL LRG LVL3 (GOWN DISPOSABLE) ×4
GOWN STRL REUS W/TWL XL LVL3 (GOWN DISPOSABLE) ×2
HEMOSTAT POWDER KIT SURGIFOAM (HEMOSTASIS) ×1 IMPLANT
KIT BASIN OR (CUSTOM PROCEDURE TRAY) ×2 IMPLANT
KIT DILATOR XLIF 5 (KITS) ×1 IMPLANT
KIT SURGICAL ACCESS MAXCESS 4 (KITS) ×1 IMPLANT
KIT TURNOVER KIT B (KITS) ×2 IMPLANT
MODULE NVM5 NEXT GEN EMG (NEEDLE) ×1 IMPLANT
MODULUS XLW 10X22X50MM 10DEG (Spine Construct) ×1 IMPLANT
NDL HYPO 25X1 1.5 SAFETY (NEEDLE) ×1 IMPLANT
NEEDLE HYPO 25X1 1.5 SAFETY (NEEDLE) ×2 IMPLANT
NS IRRIG 1000ML POUR BTL (IV SOLUTION) ×2 IMPLANT
PACK LAMINECTOMY NEURO (CUSTOM PROCEDURE TRAY) ×2 IMPLANT
PLATE 2H 10MM (Plate) ×1 IMPLANT
SCREW DECADE 5.5X50 (Screw) ×2 IMPLANT
SPONGE LAP 4X18 RFD (DISPOSABLE) IMPLANT
SUT VIC AB 2-0 CP2 18 (SUTURE) ×2 IMPLANT
SUT VIC AB 3-0 SH 8-18 (SUTURE) ×3 IMPLANT
TAPE CLOTH 4X10 WHT NS (GAUZE/BANDAGES/DRESSINGS) ×2 IMPLANT
TOWEL GREEN STERILE (TOWEL DISPOSABLE) ×2 IMPLANT
TOWEL GREEN STERILE FF (TOWEL DISPOSABLE) ×2 IMPLANT
TRAY FOLEY MTR SLVR 16FR STAT (SET/KITS/TRAYS/PACK) ×2 IMPLANT
WATER STERILE IRR 1000ML POUR (IV SOLUTION) ×2 IMPLANT

## 2019-07-14 NOTE — Anesthesia Procedure Notes (Signed)
Procedure Name: Intubation Date/Time: 07/14/2019 7:48 AM Performed by: Kyung Rudd, CRNA Pre-anesthesia Checklist: Patient identified, Emergency Drugs available, Suction available and Patient being monitored Patient Re-evaluated:Patient Re-evaluated prior to induction Oxygen Delivery Method: Circle system utilized Preoxygenation: Pre-oxygenation with 100% oxygen Induction Type: IV induction Ventilation: Mask ventilation without difficulty Laryngoscope Size: Mac and 3 Grade View: Grade I Tube type: Oral Tube size: 7.0 mm Number of attempts: 1 Airway Equipment and Method: Stylet Placement Confirmation: ETT inserted through vocal cords under direct vision,  positive ETCO2 and breath sounds checked- equal and bilateral Secured at: 21 cm Tube secured with: Tape Dental Injury: Teeth and Oropharynx as per pre-operative assessment

## 2019-07-14 NOTE — Anesthesia Postprocedure Evaluation (Signed)
Anesthesia Post Note  Patient: Regina Kim  Procedure(s) Performed: Left Lumbar two-three Anterolateral decompression/lateral plate fixation (Left Spine Lumbar)     Patient location during evaluation: PACU Anesthesia Type: General Level of consciousness: awake and alert Pain management: pain level controlled Vital Signs Assessment: post-procedure vital signs reviewed and stable Respiratory status: spontaneous breathing, nonlabored ventilation, respiratory function stable and patient connected to nasal cannula oxygen Cardiovascular status: blood pressure returned to baseline and stable Postop Assessment: no apparent nausea or vomiting Anesthetic complications: no    Last Vitals:  Vitals:   07/14/19 1034 07/14/19 1056  BP: 140/85   Pulse: 78 74  Resp: 20 20  Temp: 36.5 C (!) 36.4 C  SpO2: 97% 98%    Last Pain:  Vitals:   07/14/19 1056  TempSrc: Oral  PainSc:                  Haylyn Halberg S

## 2019-07-14 NOTE — Op Note (Signed)
Date of surgery: 07/14/2019 Preoperative diagnosis: Lumbar stenosis L2-L3, history of fusion L3-L4.,  Lumbar radiculopathy, degenerative scoliosis Postoperative diagnosis: Same Procedure: Anterolateral decompression L2-L3 with fixation using X-LIF spacer with osteo-cell allograft arthrodesis, lateral plate fixation.  EMG neuro monitoring.  Fluoroscopic visualization. Surgeon: Kristeen Miss First Assistant: Newman Pies, MD Anesthesia: General endotracheal Indications: Regina Kim is a 68 year old individual who 10 years ago underwent a decompression and fusion at L3-L4 for severe spondylitic stenosis and radiculopathy.  She recovered well after that surgery but here in the last several months has developed increasing back pain and leg pain with inability to walk distances or stand for long periods of time.  An MRI demonstrates that she has developed substantial spondylitic stenosis at the level of L2-L3 above her previous fusion.  She has been advised regarding surgical intervention which would be a procedure in the same fashion.  She is also been developing a degenerative scoliosis with collapse of the left side of the interspace at L2-L3 creating the worst of her left lumbar radicular pain.  Procedure: The patient was brought to the operating room supine on the stretcher.  After the smooth induction of general endotracheal anesthesia she was carefully placed in the right lateral decubitus position.  The bony prominences were appropriately padded and protected and orthogonal positioning on the operating table was checked using fluoroscopic visualization in the AP and lateral planes when this was verified then she was taped into position and the table had a break placed in it to allow some distraction of the collapsed left side of the interspace at L2-L3.  This could be visualized fluoroscopically.  The skin was marked for an entry site on the left lateral aspect and EMG electrodes were placed in  the major muscle groups of the lower extremities for EMG monitoring of both lower extremities during the procedure.  Then the skin was prepped with alcohol DuraPrep and draped in a sterile fashion.  Transverse incision was made in the chosen site and this was carried down through the superficial fascia.  A blunt probe was placed down to the area of the lateral inferior aspect of the rib cage.  The 11th rib was palpated with the probe.  A second incision was created posteriorly to access the retroperitoneal space.  This was done with a blunt probe and then a finger was inserted into the retroperitoneal space to pass the guide tube through the lateral fascia into the retroperitoneal space and onto the lateral aspect of the interspace at L2-L3.  There were substantial lateral osteophytes that had formed in this region.  The tube was placed over the interspace and the K wire was inserted into the interspace.  EMG monitoring was performed to make sure that no elements of the lumbar plexus were involved.  Then a series of dilators were passed over this initial tube to dilate to a 15 mm size again monitoring the EMGs during the dilation process.  850 mm deep retractor was then placed over the tubes and secured to the operating table with a clamp the posterior aspect of the opening was then checked for EMG activity and then carefully a shim was placed into the interspace at L2-L3.  With this the retractor was opened to expose the lateral aspect of the disc space at L2-L3.  Large osteophyte was encountered and this was taken down with a 2 mm Kerrison punch and then an osteophytectomy tool.  The disc space could be more fully entered and then a  series of dilators were passed after opening the contralateral lateral ligament using a Cobb elevator.  Once the dilators were passed then some curettes and rongeurs could be passed into the interspace to remove a substantial amount of markedly degenerated disc material.  The  interspace was then opened gradually by using a series of paddles to dilate the interspace 1 mm at the time ultimately it was felt that a 10 mm tall 22 mm anterior posterior with spacer with 15 mm of width across the disc space and 10 degrees of lordosis would fit best to allow some distraction of the interspace at L2-3.  This was then filled with ostia cell and carefully placed into the interspace after adequate decortication was performed.  Once the spacer was placed the retractors were set a bit wider to allow placement of a 10 mm tall lateral plate which was fixed to the vertebral body with two 5.5 x 50 mm screws which achieved bicortical purchase.  Final radiographs were then taken after the retractor was carefully removed EMG monitoring was noted to be silent throughout the procedure blood loss for the entire procedure was estimated approximately 50 cc.  The incisions were closed with 2-0 Vicryl in the fascia and 3-0 Vicryl subcuticularly Dermabond was placed on the skin and the patient was returned to the recovery room in stable condition.

## 2019-07-14 NOTE — Progress Notes (Signed)
Patient ID: Regina Kim, female   DOB: 03/20/1951, 68 y.o.   MRN: [id number] Vital signs are stable Patient complaining of centralized back pain Motor function appears intact Hold light Valium to medication regimen

## 2019-07-14 NOTE — Transfer of Care (Signed)
Immediate Anesthesia Transfer of Care Note  Patient: Regina Kim  Procedure(s) Performed: Left Lumbar two-three Anterolateral decompression/lateral plate fixation (Left Spine Lumbar)  Patient Location: PACU  Anesthesia Type:General  Level of Consciousness: awake, alert  and oriented  Airway & Oxygen Therapy: Patient Spontanous Breathing and Patient connected to nasal cannula oxygen  Post-op Assessment: Report given to RN, Post -op Vital signs reviewed and stable and Patient moving all extremities X 4  Post vital signs: Reviewed and stable  Last Vitals:  Vitals Value Taken Time  BP    Temp    Pulse    Resp    SpO2      Last Pain:  Vitals:   07/14/19 0619  TempSrc:   PainSc: 6       Patients Stated Pain Goal: 3 ([id number])  Complications: No apparent anesthesia complications

## 2019-07-14 NOTE — H&P (Signed)
  Regina Kim returns to the office today.  It seems that in 2010, I did an anterolateral decompression at L3-4 with lateral plate fixation.  Regina Kim got along quite well with that surgery and since then, she has had a hip replacement on the right side.  She notes that she keeps some right lower extremity pain and discomfort and weakness in both quads.  She notes that she has a left hip that is going bad and is to have a left hip replacement, but she finds that walking up stairs, even a brief distance or standing for any length of time, tends to aggravate substantial pain in both her quads and in her low back.  This has become progressively worse, and recently she saw Dr. Mayer Camel for some further workup.  Plain x-rays were performed and an MRI was ordered.  The MRI demonstrates that Regina Kim has advanced degenerative changes at L2-L3 immediately above her L3-4 fusion.  She has a foraminal disc protrusion on the left side at L2-3 and extraforaminal protrusion of the disc on the right side at L2-3 that compresses the L2 nerve root particularly.  She notes that the problem is getting more severe and it limits her capacity to do her normal activities.  She is eager to get something definitive done as she has planned to have her left hip done at some time in the future.  PAST MEDICAL HISTORY: Reveals that her general health has been good.  She does have diabetes, which has been controlled well with Metformin and Glimepiride.  MEDICATIONS: Additional medications include Prevacid, Zyrtec, Atorvastatin, Glimepiride, Baclofen, Meloxicam, Ergocalciferol, Lisinopril, Turmeric, and ProAir HFA.     She keeps regular check on her blood sugars and notes that she keeps her blood sugars under good control.  PHYSICAL EXAMINATION: I note that her motor strength in the iliopsoas, the quadriceps, the tibialis anterior and the gastrocs is good and symmetric.  She has a trace reflex in both patellae and 1+ reflex in both Achilles.   She has no difficulty stepping up onto a 7-inch stool with either lower extremity.  IMPRESSION: Based on the MRI findings, Regina Kim does have significant lateral recess stenosis that would involve mostly the L2 nerve roots.  Her symptoms have been ongoing for some time, and she is experiencing weakness and pain of significance that is preventing her activities.  I noted that Regina Kim would ultimately be best served by decompressing this area using a similar technique to what we did 10 years ago.  I noted that otherwise, her spine looks fairly healthy, and hopefully by decompressing and stabilizing L2-3, which would be from a left-sided approach, we can give her relief of her symptoms and hopefully improvement in her level of function.  We will plan on scheduling this at the earliest convenience for her.

## 2019-07-15 LAB — GLUCOSE, CAPILLARY
Glucose-Capillary: 132 mg/dL — ABNORMAL HIGH (ref 70–99)
Glucose-Capillary: 64 mg/dL — ABNORMAL LOW (ref 70–99)
Glucose-Capillary: 98 mg/dL (ref 70–99)

## 2019-07-15 MED ORDER — DIAZEPAM 5 MG PO TABS: 5.0000 mg | ORAL_TABLET | Freq: Four times a day (QID) | ORAL | 0 refills | Status: AC | PRN

## 2019-07-15 MED ORDER — OXYCODONE-ACETAMINOPHEN 5-325 MG PO TABS: 1.0000 | ORAL_TABLET | Freq: Four times a day (QID) | ORAL | 0 refills | Status: DC | PRN

## 2019-07-15 NOTE — Discharge Summary (Signed)
Physician Discharge Summary  Patient ID: Regina Kim MRN: [id number] DOB/AGE: 67-28-1952 67 y.o.  Admit date: 07/14/2019 Discharge date: 07/15/2019  Admission Diagnoses: Lumbar spondylosis and stenosis with neurogenic claudication, lumbar radiculopathy L2-L3.  History of fusion L3-L4  Discharge Diagnoses: Lumbar spondylosis and stenosis with neurogenic claudication, lumbar radiculopathy L2-L3.  History of fusion L3-L4 Active Problems:   Lumbar stenosis with neurogenic claudication   Discharged Condition: good  Hospital Course: Patient was admitted to undergo surgical decompression of L2-L3 using an anterolateral technique.  She tolerated surgery well.  She is ambulatory.  She has minimal discomfort in the left anterior thigh.  Consults: None  Significant Diagnostic Studies: None  Treatments: surgery: Anterolateral decompression L2-L3  Discharge Exam: Blood pressure 111/69, pulse 76, temperature 97.6 F (36.4 C), resp. rate 16, height 5' 6.5" (1.689 m), weight 82.5 kg, SpO2 95 %. Incision is clean and dry.  Station and gait are intact  Disposition: Discharge disposition: 01-Home or Self Care       Discharge Instructions    Call MD for:  redness, tenderness, or signs of infection (pain, swelling, redness, odor or green/yellow discharge around incision site)   Complete by: As directed    Call MD for:  severe uncontrolled pain   Complete by: As directed    Call MD for:  temperature >100.4   Complete by: As directed    Diet - low sodium heart healthy   Complete by: As directed    Discharge instructions   Complete by: As directed    Okay to shower. Do not apply salves or appointments to incision. No heavy lifting with the upper extremities greater than 15 pounds. May resume driving when not requiring pain medication and patient feels comfortable with doing so.   Incentive spirometry RT   Complete by: As directed    Increase activity slowly   Complete by: As directed       Allergies as of 07/15/2019      Reactions   Nickel Rash   Vicodin [hydrocodone-acetaminophen] Nausea And Vomiting      Medication List    TAKE these medications   aspirin EC 81 MG tablet Take 81 mg by mouth daily.   atorvastatin 20 MG tablet Commonly known as: LIPITOR Take 20 mg by mouth daily.   baclofen 10 MG tablet Commonly known as: LIORESAL Take 10 mg by mouth daily as needed for muscle spasms.   CENTRUM SILVER ADULT 50+ PO Take 1 tablet by mouth daily.   PRESERVISION AREDS 2 PO Take 1 capsule by mouth 2 (two) times daily.   cephALEXin 500 MG capsule Commonly known as: Keflex Take 2 capsules (1,000 mg total) by mouth 2 (two) times daily. x 10 days   Coenzyme Q10 300 MG Caps Take 300 mg by mouth daily.   diazepam 5 MG tablet Commonly known as: Valium Take 1 tablet (5 mg total) by mouth every 6 (six) hours as needed for muscle spasms.   glimepiride 4 MG tablet Commonly known as: AMARYL Take 4 mg by mouth daily with breakfast.   lisinopril 10 MG tablet Commonly known as: ZESTRIL Take 10 mg by mouth daily.   metFORMIN 1000 MG tablet Commonly known as: GLUCOPHAGE Take 1,000 mg by mouth 2 (two) times daily with a meal.   omeprazole 10 MG capsule Commonly known as: PRILOSEC Take 10 mg by mouth daily.   ONE TOUCH ULTRA TEST test strip Generic drug: glucose blood   oxyCODONE-acetaminophen 5-325 MG tablet Commonly known as:  PERCOCET/ROXICET Take 1-2 tablets by mouth every 6 (six) hours as needed for moderate pain or severe pain.   SYSTANE OP Place 1 drop into both eyes 2 (two) times daily.   TURMERIC PO Take 1,000 mg by mouth daily.        Signed: Blanchie Dessert Marrion Finan 07/15/2019, 11:14 AM

## 2019-07-15 NOTE — Evaluation (Signed)
Occupational Therapy Evaluation and Discharge Patient Details Name: Regina Kim MRN: [id number] DOB: 1950/11/08 Today's Date: 07/15/2019    History of Present Illness 68 yo admitted for L2-3 XLIF. PMHx: L3-4 fusion, DM   Clinical Impression   PTA Pt independent, likes doing yard art. Today Pt is mod I for ADL - educated on compensatory strategies for ADL - Pt has a walk in shower and essential tools. Back handout provided and reviewed adls in detail. Pt educated on: clothing between brace, never sleep in brace, set an alarm at night for medication, avoid sitting for long periods of time, correct bed positioning for sleeping, correct sequence for bed mobility, avoiding lifting more than 5 pounds and never wash directly over incision. All education is complete and patient indicates understanding. OT to sign off at this time.     Follow Up Recommendations  No OT follow up;Supervision - Intermittent    Equipment Recommendations  None recommended by OT    Recommendations for Other Services       Precautions / Restrictions Precautions Precautions: Back Precaution Booklet Issued: Yes (comment) Required Braces or Orthoses: Spinal Brace Spinal Brace: Lumbar corset Restrictions Weight Bearing Restrictions: No      Mobility Bed Mobility Overal bed mobility: Modified Independent             General bed mobility comments: sitting to supine, good log roll technique  Transfers Overall transfer level: Independent                    Balance Overall balance assessment: No apparent balance deficits (not formally assessed)                                         ADL either performed or assessed with clinical judgement   ADL Overall ADL's : Modified independent                                       General ADL Comments: Able to don/doff socks and brace, able to perform toilet transfer, peri care (did educate Pt on toilet aide), able  to perform sink level grooming implementing compensatory strategies     Vision         Perception     Praxis      Pertinent Vitals/Pain Pain Assessment: 0-10 Pain Score: 4  Pain Location: LLE Pain Descriptors / Indicators: Sore Pain Intervention(s): Monitored during session;Repositioned     Hand Dominance Right   Extremity/Trunk Assessment Upper Extremity Assessment Upper Extremity Assessment: Overall WFL for tasks assessed   Lower Extremity Assessment Lower Extremity Assessment: Defer to PT evaluation LLE Deficits / Details: pain 5/10   Cervical / Trunk Assessment Cervical / Trunk Assessment: Other exceptions Cervical / Trunk Exceptions: s/p sx   Communication Communication Communication: No difficulties   Cognition Arousal/Alertness: Awake/alert Behavior During Therapy: WFL for tasks assessed/performed Overall Cognitive Status: Within Functional Limits for tasks assessed                                     General Comments       Exercises     Shoulder Instructions      Home Living Family/patient expects to be discharged to:: Private residence  Living Arrangements: Spouse/significant other Available Help at Discharge: Family;Available 24 hours/day Type of Home: House Home Access: Stairs to enter [employer] of Steps: 5 Entrance Stairs-Rails: Right Home Layout: Multi-level Alternate Level Stairs-Number of Steps: 7 Alternate Level Stairs-Rails: Right;Left Bathroom Shower/Tub: Producer, television/film/video: Handicapped height Bathroom Accessibility: Yes How Accessible: Accessible via walker Home Equipment: Cane - single point;Bedside commode          Prior Functioning/Environment Level of Independence: Independent        Comments: enjoys making yard art        OT Problem List: Decreased activity tolerance;Decreased knowledge of use of DME or AE;Decreased knowledge of precautions;Pain      OT  Treatment/Interventions:      OT Goals(Current goals can be found in the care plan section) Acute Rehab OT Goals Patient Stated Goal: to get home today OT Goal Formulation: With patient Time For Goal Achievement: 07/29/19 Potential to Achieve Goals: Good  OT Frequency:     Barriers to D/C:            Co-evaluation              AM-PAC OT "6 Clicks" Daily Activity     Outcome Measure Help from another person eating meals?: None Help from another person taking care of personal grooming?: None Help from another person toileting, which includes using toliet, bedpan, or urinal?: A Little Help from another person bathing (including washing, rinsing, drying)?: None Help from another person to put on and taking off regular upper body clothing?: None Help from another person to put on and taking off regular lower body clothing?: None 6 Click Score: 23   End of Session Equipment Utilized During Treatment: Back brace Nurse Communication: Mobility status  Activity Tolerance: Patient tolerated treatment well Patient left: in bed;with call bell/phone within reach  OT Visit Diagnosis: Pain Pain - Right/Left: Left Pain - part of body: Leg                Time: 6387-5643 OT Time Calculation (min): 16 min Charges:  OT General Charges $OT Visit: 1 Visit OT Evaluation $OT Eval Low Complexity: 1 Low  Sherryl Manges OTR/L Acute Rehabilitation Services Pager: [phone] Office: [phone]   Regina Kim 07/15/2019, 10:13 AM

## 2019-07-15 NOTE — Evaluation (Signed)
Physical Therapy Evaluation/ Discharge Patient Details Name: Regina Kim MRN: [id number] DOB: 1951-05-12 Today's Date: 07/15/2019   History of Present Illness  68 yo admitted for L2-3 XLIF. PMHx: L3-4 fusion, DM  Clinical Impression  Pt sitting EOB on arrival with pt educated for brace wear, precautions, transfers, gait and safety with pt able to demonstrate understanding. Pt able to don brace and socks without assist EOB and will have assist of family as necessary. Pt educated for walking program and all questions answered with continued mobility recommended and sitting limit discussed. Pt safe for D/C and pt in agreement.      Follow Up Recommendations No PT follow up    Equipment Recommendations  None recommended by PT    Recommendations for Other Services       Precautions / Restrictions Precautions Precautions: Back Precaution Booklet Issued: Yes (comment) Required Braces or Orthoses: Spinal Brace Spinal Brace: Lumbar corset      Mobility  Bed Mobility Overal bed mobility: Modified Independent                Transfers Overall transfer level: Independent                  Ambulation/Gait Ambulation/Gait assistance: Independent Gait Distance (Feet): 400 Feet Assistive device: None Gait Pattern/deviations: WFL(Within Functional Limits)   Gait velocity interpretation: >2.62 ft/sec, indicative of community ambulatory General Gait Details: stedy gait with good speed  Stairs Stairs: Yes Stairs assistance: Modified independent (Device/Increase time) Stair Management: Alternating pattern;Forwards Number of Stairs: 11 General stair comments: good safety with use of rail  Wheelchair Mobility    Modified Rankin (Stroke Patients Only)       Balance Overall balance assessment: No apparent balance deficits (not formally assessed)                                           Pertinent Vitals/Pain Pain Assessment: 0-10 Pain  Score: 5  Pain Location: LLE Pain Descriptors / Indicators: Sore Pain Intervention(s): Limited activity within patient's tolerance;Monitored during session;Repositioned    Home Living Family/patient expects to be discharged to:: Private residence Living Arrangements: Spouse/significant other Available Help at Discharge: Family;Available 24 hours/day Type of Home: House Home Access: Stairs to enter Entrance Stairs-Rails: Right Entrance Stairs-Number of Steps: 5 Home Layout: Multi-level Home Equipment: Cane - single point      Prior Function Level of Independence: Independent         Comments: enjoys making yard Systems analyst        Extremity/Trunk Assessment   Upper Extremity Assessment Upper Extremity Assessment: Overall WFL for tasks assessed    Lower Extremity Assessment Lower Extremity Assessment: LLE deficits/detail LLE Deficits / Details: pain 5/10       Communication   Communication: No difficulties  Cognition Arousal/Alertness: Awake/alert Behavior During Therapy: WFL for tasks assessed/performed Overall Cognitive Status: Within Functional Limits for tasks assessed                                        General Comments      Exercises     Assessment/Plan    PT Assessment Patent does not need any further PT services  PT Problem List         PT Treatment  Interventions      PT Goals (Current goals can be found in the Care Plan section)  Acute Rehab PT Goals PT Goal Formulation: All assessment and education complete, DC therapy    Frequency     Barriers to discharge        Co-evaluation               AM-PAC PT "6 Clicks" Mobility  Outcome Measure Help needed turning from your back to your side while in a flat bed without using bedrails?: None Help needed moving from lying on your back to sitting on the side of a flat bed without using bedrails?: None Help needed moving to and from a bed to a chair  (including a wheelchair)?: None Help needed standing up from a chair using your arms (e.g., wheelchair or bedside chair)?: None Help needed to walk in hospital room?: None Help needed climbing 3-5 steps with a railing? : None 6 Click Score: 24    End of Session Equipment Utilized During Treatment: Back brace Activity Tolerance: Patient tolerated treatment well Patient left: in chair;with call bell/phone within reach Nurse Communication: Mobility status;Precautions PT Visit Diagnosis: Other abnormalities of gait and mobility (R26.89)    Time: 8850-2774 PT Time Calculation (min) (ACUTE ONLY): 19 min   Charges:   PT Evaluation $PT Eval Moderate Complexity: 1 Mod          Asiah Befort Abner Greenspan, PT Acute Rehabilitation Services Pager: [phone] Office: [phone]   Robinson Brinkley B Kooper Godshall 07/15/2019, 8:28 AM

## 2019-07-15 NOTE — Plan of Care (Signed)
Patient alert and oriented, mae's well, voiding adequate amount of urine, swallowing without difficulty, no c/o pain. Patient discharged home with family. Discharged instructions given to patient and spouse. Patient and family stated understanding of d/c instructions given and has an appointment with MD.

## 2019-07-15 NOTE — Discharge Instructions (Signed)

## 2019-07-18 ENCOUNTER — Encounter (HOSPITAL_COMMUNITY): Payer: Self-pay | Admitting: Neurological Surgery

## 2019-07-29 DIAGNOSIS — M48061 Spinal stenosis, lumbar region without neurogenic claudication: Secondary | ICD-10-CM | POA: Diagnosis not present

## 2019-08-09 DIAGNOSIS — E7800 Pure hypercholesterolemia, unspecified: Secondary | ICD-10-CM | POA: Diagnosis not present

## 2019-08-09 DIAGNOSIS — K219 Gastro-esophageal reflux disease without esophagitis: Secondary | ICD-10-CM | POA: Diagnosis not present

## 2019-08-09 DIAGNOSIS — E669 Obesity, unspecified: Secondary | ICD-10-CM | POA: Diagnosis not present

## 2019-08-09 DIAGNOSIS — Z0000 Encounter for general adult medical examination without abnormal findings: Secondary | ICD-10-CM | POA: Diagnosis not present

## 2019-08-09 DIAGNOSIS — Z1231 Encounter for screening mammogram for malignant neoplasm of breast: Secondary | ICD-10-CM | POA: Diagnosis not present

## 2019-08-09 DIAGNOSIS — M8580 Other specified disorders of bone density and structure, unspecified site: Secondary | ICD-10-CM | POA: Diagnosis not present

## 2019-08-09 DIAGNOSIS — E1121 Type 2 diabetes mellitus with diabetic nephropathy: Secondary | ICD-10-CM | POA: Diagnosis not present

## 2019-08-09 DIAGNOSIS — J4520 Mild intermittent asthma, uncomplicated: Secondary | ICD-10-CM | POA: Diagnosis not present

## 2019-08-09 DIAGNOSIS — Z23 Encounter for immunization: Secondary | ICD-10-CM | POA: Diagnosis not present

## 2019-08-09 DIAGNOSIS — I10 Essential (primary) hypertension: Secondary | ICD-10-CM | POA: Diagnosis not present

## 2019-08-17 ENCOUNTER — Other Ambulatory Visit: Payer: Self-pay | Admitting: Family Medicine

## 2019-08-17 DIAGNOSIS — Z1231 Encounter for screening mammogram for malignant neoplasm of breast: Secondary | ICD-10-CM

## 2019-08-18 ENCOUNTER — Ambulatory Visit: Payer: Medicare Other

## 2019-08-19 ENCOUNTER — Other Ambulatory Visit: Payer: Self-pay

## 2019-08-19 ENCOUNTER — Ambulatory Visit
Admission: RE | Admit: 2019-08-19 | Discharge: 2019-08-19 | Disposition: A | Discharge status: Home / Self Care | Payer: Medicare Other | Source: Ambulatory Visit | Attending: Family Medicine | Admitting: Family Medicine

## 2019-08-19 DIAGNOSIS — Z1231 Encounter for screening mammogram for malignant neoplasm of breast: Secondary | ICD-10-CM | POA: Diagnosis not present

## 2019-09-22 DIAGNOSIS — M9902 Segmental and somatic dysfunction of thoracic region: Secondary | ICD-10-CM | POA: Diagnosis not present

## 2019-09-22 DIAGNOSIS — M5032 Other cervical disc degeneration, mid-cervical region, unspecified level: Secondary | ICD-10-CM | POA: Diagnosis not present

## 2019-09-22 DIAGNOSIS — M531 Cervicobrachial syndrome: Secondary | ICD-10-CM | POA: Diagnosis not present

## 2019-09-22 DIAGNOSIS — M9901 Segmental and somatic dysfunction of cervical region: Secondary | ICD-10-CM | POA: Diagnosis not present

## 2019-09-28 DIAGNOSIS — R030 Elevated blood-pressure reading, without diagnosis of hypertension: Secondary | ICD-10-CM | POA: Diagnosis not present

## 2019-09-28 DIAGNOSIS — M48061 Spinal stenosis, lumbar region without neurogenic claudication: Secondary | ICD-10-CM | POA: Diagnosis not present

## 2019-09-28 DIAGNOSIS — Z6829 Body mass index (BMI) 29.0-29.9, adult: Secondary | ICD-10-CM | POA: Diagnosis not present

## 2019-11-11 ENCOUNTER — Ambulatory Visit: Payer: Medicare Other | Attending: Internal Medicine

## 2019-11-11 DIAGNOSIS — Z23 Encounter for immunization: Secondary | ICD-10-CM | POA: Insufficient documentation

## 2019-11-11 NOTE — Progress Notes (Signed)
   Covid-19 Vaccination Clinic  Name:  Regina Kim    MRN: [id number] DOB: 06/17/1951  11/11/2019  [name] was observed post Covid-19 immunization for 15 minutes without incidence. She was provided with Vaccine Information Sheet and instruction to access the V-Safe system.   [name] was instructed to call 911 with any severe reactions post vaccine: Marland Kitchen Difficulty breathing  . Swelling of your face and throat  . A fast heartbeat  . A bad rash all over your body  . Dizziness and weakness    Immunizations Administered    Name Date Dose VIS Date Route   Pfizer COVID-19 Vaccine 11/11/2019  6:40 PM 0.3 mL 09/30/2019 Intramuscular   Manufacturer: [employer], [employer]   Lot: [id number]   NDC: [address]-2

## 2019-12-02 ENCOUNTER — Ambulatory Visit: Payer: Medicare Other | Attending: Internal Medicine

## 2019-12-02 DIAGNOSIS — Z23 Encounter for immunization: Secondary | ICD-10-CM

## 2019-12-02 NOTE — Progress Notes (Signed)
   Covid-19 Vaccination Clinic  Name:  Jeydi Klingel    MRN: [id number] DOB: [birth year]  12/02/2019  Ms. Bassette was observed post Covid-19 immunization for 15 minutes without incidence. She was provided with Vaccine Information Sheet and instruction to access the V-Safe system.   Ms. Reinard was instructed to call 911 with any severe reactions post vaccine: Marland Kitchen Difficulty breathing  . Swelling of your face and throat  . A fast heartbeat  . A bad rash all over your body  . Dizziness and weakness    Immunizations Administered    Name Date Dose VIS Date Route   Pfizer COVID-19 Vaccine 12/02/2019 10:25 AM 0.3 mL 09/30/2019 Intramuscular   Manufacturer: [employer], [employer]   Lot: [id number]   NDC: [address]-5

## 2020-08-06 ENCOUNTER — Other Ambulatory Visit: Payer: Self-pay | Admitting: Family Medicine

## 2020-08-06 DIAGNOSIS — Z1231 Encounter for screening mammogram for malignant neoplasm of breast: Secondary | ICD-10-CM

## 2020-08-09 DIAGNOSIS — Z23 Encounter for immunization: Secondary | ICD-10-CM | POA: Diagnosis not present

## 2020-09-24 ENCOUNTER — Other Ambulatory Visit: Payer: Self-pay

## 2020-09-24 ENCOUNTER — Ambulatory Visit
Admission: RE | Admit: 2020-09-24 | Discharge: 2020-09-24 | Disposition: A | Discharge status: Home / Self Care | Payer: Medicare Other | Source: Ambulatory Visit | Attending: Family Medicine | Admitting: Family Medicine

## 2020-09-24 DIAGNOSIS — Z1231 Encounter for screening mammogram for malignant neoplasm of breast: Secondary | ICD-10-CM

## 2020-10-22 DIAGNOSIS — G4452 New daily persistent headache (NDPH): Secondary | ICD-10-CM | POA: Diagnosis not present

## 2020-12-26 DIAGNOSIS — M79671 Pain in right foot: Secondary | ICD-10-CM | POA: Diagnosis not present

## 2020-12-26 DIAGNOSIS — M21611 Bunion of right foot: Secondary | ICD-10-CM | POA: Diagnosis not present

## 2020-12-26 DIAGNOSIS — B079 Viral wart, unspecified: Secondary | ICD-10-CM | POA: Diagnosis not present

## 2021-01-03 DIAGNOSIS — M109 Gout, unspecified: Secondary | ICD-10-CM | POA: Diagnosis not present

## 2021-01-08 DIAGNOSIS — M7061 Trochanteric bursitis, right hip: Secondary | ICD-10-CM | POA: Diagnosis not present

## 2021-01-09 DIAGNOSIS — B079 Viral wart, unspecified: Secondary | ICD-10-CM | POA: Diagnosis not present

## 2021-01-10 DIAGNOSIS — M7631 Iliotibial band syndrome, right leg: Secondary | ICD-10-CM | POA: Diagnosis not present

## 2021-01-16 DIAGNOSIS — M7631 Iliotibial band syndrome, right leg: Secondary | ICD-10-CM | POA: Diagnosis not present

## 2021-01-18 DIAGNOSIS — M7631 Iliotibial band syndrome, right leg: Secondary | ICD-10-CM | POA: Diagnosis not present

## 2021-01-23 DIAGNOSIS — B079 Viral wart, unspecified: Secondary | ICD-10-CM | POA: Diagnosis not present

## 2021-01-25 DIAGNOSIS — M7631 Iliotibial band syndrome, right leg: Secondary | ICD-10-CM | POA: Diagnosis not present

## 2021-01-30 DIAGNOSIS — M7631 Iliotibial band syndrome, right leg: Secondary | ICD-10-CM | POA: Diagnosis not present

## 2021-02-04 DIAGNOSIS — M7631 Iliotibial band syndrome, right leg: Secondary | ICD-10-CM | POA: Diagnosis not present

## 2021-02-06 DIAGNOSIS — M7631 Iliotibial band syndrome, right leg: Secondary | ICD-10-CM | POA: Diagnosis not present

## 2021-02-08 DIAGNOSIS — M7631 Iliotibial band syndrome, right leg: Secondary | ICD-10-CM | POA: Diagnosis not present

## 2021-02-12 DIAGNOSIS — I2510 Atherosclerotic heart disease of native coronary artery without angina pectoris: Secondary | ICD-10-CM | POA: Diagnosis not present

## 2021-02-12 DIAGNOSIS — E1165 Type 2 diabetes mellitus with hyperglycemia: Secondary | ICD-10-CM | POA: Diagnosis not present

## 2021-02-12 DIAGNOSIS — E7800 Pure hypercholesterolemia, unspecified: Secondary | ICD-10-CM | POA: Diagnosis not present

## 2021-02-12 DIAGNOSIS — G4761 Periodic limb movement disorder: Secondary | ICD-10-CM | POA: Diagnosis not present

## 2021-02-12 DIAGNOSIS — Z7984 Long term (current) use of oral hypoglycemic drugs: Secondary | ICD-10-CM | POA: Diagnosis not present

## 2021-02-12 DIAGNOSIS — E782 Mixed hyperlipidemia: Secondary | ICD-10-CM | POA: Diagnosis not present

## 2021-02-12 DIAGNOSIS — E1121 Type 2 diabetes mellitus with diabetic nephropathy: Secondary | ICD-10-CM | POA: Diagnosis not present

## 2021-02-12 DIAGNOSIS — I10 Essential (primary) hypertension: Secondary | ICD-10-CM | POA: Diagnosis not present

## 2021-02-13 DIAGNOSIS — M7631 Iliotibial band syndrome, right leg: Secondary | ICD-10-CM | POA: Diagnosis not present

## 2021-02-15 DIAGNOSIS — M7631 Iliotibial band syndrome, right leg: Secondary | ICD-10-CM | POA: Diagnosis not present

## 2021-02-18 DIAGNOSIS — M7631 Iliotibial band syndrome, right leg: Secondary | ICD-10-CM | POA: Diagnosis not present

## 2021-02-20 DIAGNOSIS — M7631 Iliotibial band syndrome, right leg: Secondary | ICD-10-CM | POA: Diagnosis not present

## 2021-02-25 DIAGNOSIS — M7631 Iliotibial band syndrome, right leg: Secondary | ICD-10-CM | POA: Diagnosis not present

## 2021-02-27 DIAGNOSIS — M7631 Iliotibial band syndrome, right leg: Secondary | ICD-10-CM | POA: Diagnosis not present

## 2021-03-09 DIAGNOSIS — J019 Acute sinusitis, unspecified: Secondary | ICD-10-CM | POA: Diagnosis not present

## 2021-03-09 DIAGNOSIS — Z8709 Personal history of other diseases of the respiratory system: Secondary | ICD-10-CM | POA: Diagnosis not present

## 2021-03-09 DIAGNOSIS — J40 Bronchitis, not specified as acute or chronic: Secondary | ICD-10-CM | POA: Diagnosis not present

## 2021-03-09 DIAGNOSIS — R059 Cough, unspecified: Secondary | ICD-10-CM | POA: Diagnosis not present

## 2021-03-13 DIAGNOSIS — M7631 Iliotibial band syndrome, right leg: Secondary | ICD-10-CM | POA: Diagnosis not present

## 2021-03-19 DIAGNOSIS — M7631 Iliotibial band syndrome, right leg: Secondary | ICD-10-CM | POA: Diagnosis not present

## 2021-03-27 DIAGNOSIS — M7631 Iliotibial band syndrome, right leg: Secondary | ICD-10-CM | POA: Diagnosis not present

## 2021-03-31 DIAGNOSIS — Z23 Encounter for immunization: Secondary | ICD-10-CM | POA: Diagnosis not present

## 2021-04-01 DIAGNOSIS — M7631 Iliotibial band syndrome, right leg: Secondary | ICD-10-CM | POA: Diagnosis not present

## 2021-04-03 DIAGNOSIS — M7631 Iliotibial band syndrome, right leg: Secondary | ICD-10-CM | POA: Diagnosis not present

## 2021-04-08 DIAGNOSIS — M7631 Iliotibial band syndrome, right leg: Secondary | ICD-10-CM | POA: Diagnosis not present

## 2021-04-12 DIAGNOSIS — J209 Acute bronchitis, unspecified: Secondary | ICD-10-CM | POA: Diagnosis not present

## 2021-04-15 DIAGNOSIS — M7631 Iliotibial band syndrome, right leg: Secondary | ICD-10-CM | POA: Diagnosis not present

## 2021-04-25 IMAGING — RF DG C-ARM 1-60 MIN
1 series · 2 of 2 positions shown · non-contrast
Comparison: Radiographs dated 05/31/2019

CLINICAL DATA: Lumbar disc disease.

EXAM:
DG C-ARM 1-60 MIN; LUMBAR SPINE - 2-3 VIEW
FLUOROSCOPY TIME:  Fluoroscopy Time:  1 minutes 37 seconds

[Series 1: run · 2 of 2 slices shown]
[im 1/2]
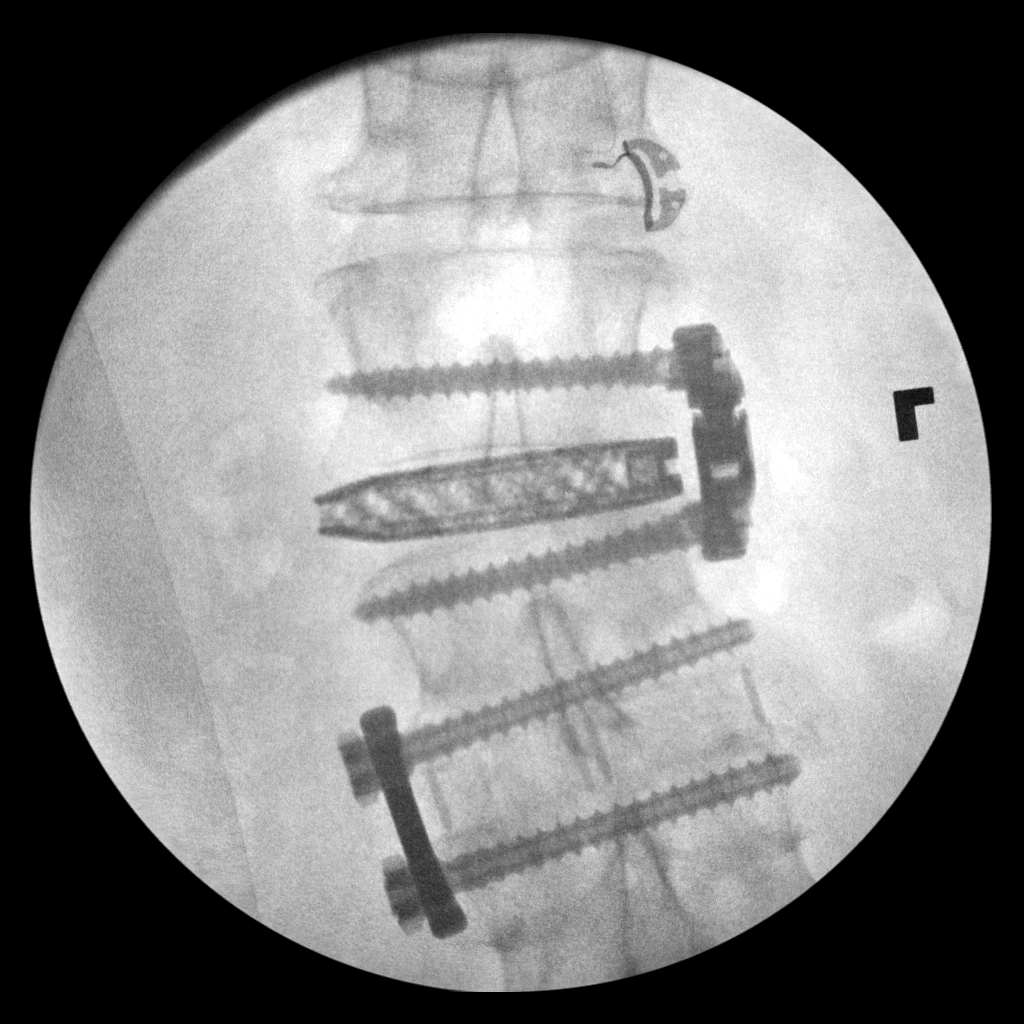
[im 2/2]
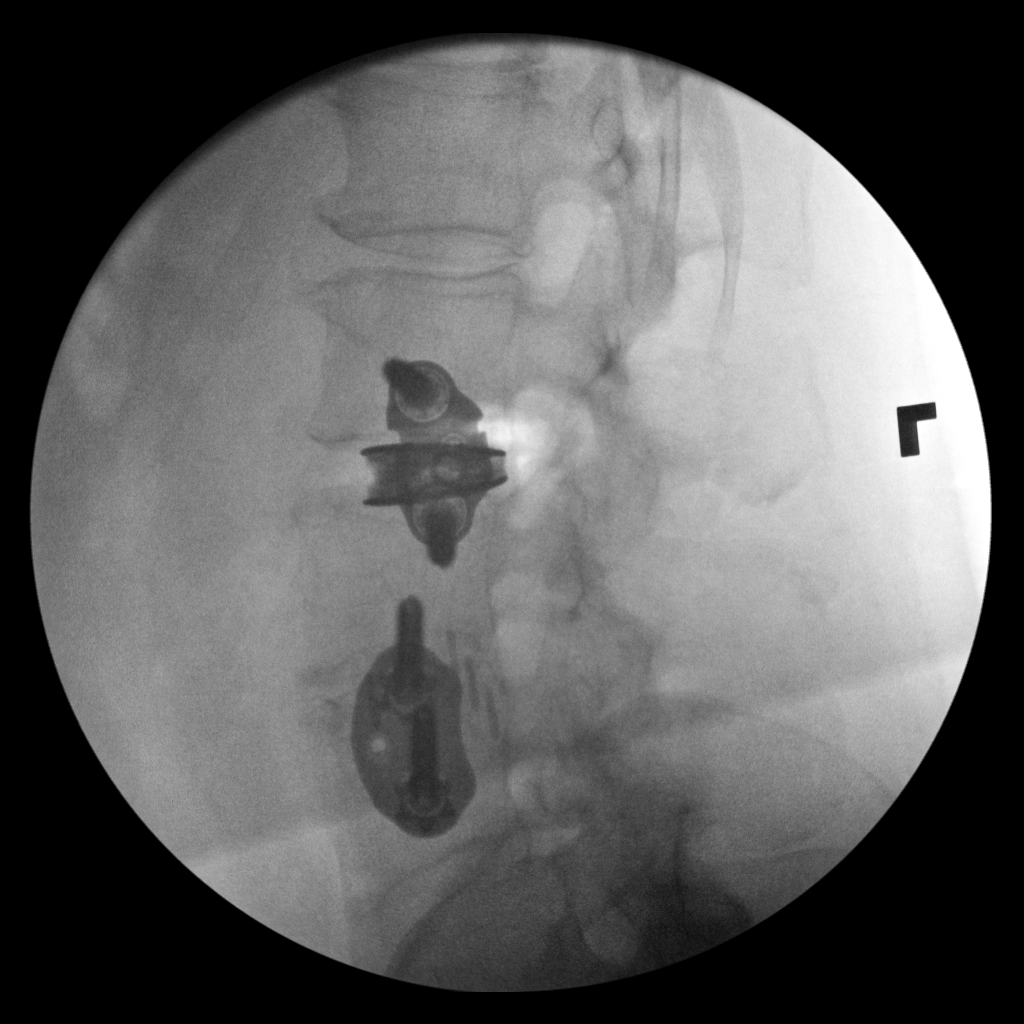

[2 of 2 positions shown; findings below may reference images not displayed]

FINDINGS: AP and lateral C-arm images demonstrate the patient has undergone
interbody fusion from a left lateral approach at L2-3. Hardware
appears in good position in the AP and lateral projection. Lateral
alignment appears normal.
IMPRESSION: Interbody fusion performed at L2-3.

C-arm fluoroscopic images were obtained intraoperatively and
submitted for post operative interpretation.

## 2021-05-14 DIAGNOSIS — I10 Essential (primary) hypertension: Secondary | ICD-10-CM | POA: Diagnosis not present

## 2021-05-14 DIAGNOSIS — E1121 Type 2 diabetes mellitus with diabetic nephropathy: Secondary | ICD-10-CM | POA: Diagnosis not present

## 2021-05-14 DIAGNOSIS — Z7984 Long term (current) use of oral hypoglycemic drugs: Secondary | ICD-10-CM | POA: Diagnosis not present

## 2021-05-14 DIAGNOSIS — M5106 Intervertebral disc disorders with myelopathy, lumbar region: Secondary | ICD-10-CM | POA: Diagnosis not present

## 2021-05-14 DIAGNOSIS — N183 Chronic kidney disease, stage 3 unspecified: Secondary | ICD-10-CM | POA: Diagnosis not present

## 2021-05-24 DIAGNOSIS — N182 Chronic kidney disease, stage 2 (mild): Secondary | ICD-10-CM | POA: Diagnosis not present

## 2021-05-30 DIAGNOSIS — E1122 Type 2 diabetes mellitus with diabetic chronic kidney disease: Secondary | ICD-10-CM | POA: Diagnosis not present

## 2021-05-30 DIAGNOSIS — R801 Persistent proteinuria, unspecified: Secondary | ICD-10-CM | POA: Diagnosis not present

## 2021-05-30 DIAGNOSIS — E875 Hyperkalemia: Secondary | ICD-10-CM | POA: Diagnosis not present

## 2021-05-30 DIAGNOSIS — N183 Chronic kidney disease, stage 3 unspecified: Secondary | ICD-10-CM | POA: Diagnosis not present

## 2021-05-30 DIAGNOSIS — I129 Hypertensive chronic kidney disease with stage 1 through stage 4 chronic kidney disease, or unspecified chronic kidney disease: Secondary | ICD-10-CM | POA: Diagnosis not present

## 2021-05-30 DIAGNOSIS — N1831 Chronic kidney disease, stage 3a: Secondary | ICD-10-CM | POA: Diagnosis not present

## 2021-06-11 DIAGNOSIS — H35372 Puckering of macula, left eye: Secondary | ICD-10-CM | POA: Diagnosis not present

## 2021-06-11 DIAGNOSIS — H43812 Vitreous degeneration, left eye: Secondary | ICD-10-CM | POA: Diagnosis not present

## 2021-06-11 DIAGNOSIS — H353131 Nonexudative age-related macular degeneration, bilateral, early dry stage: Secondary | ICD-10-CM | POA: Diagnosis not present

## 2021-06-11 DIAGNOSIS — E119 Type 2 diabetes mellitus without complications: Secondary | ICD-10-CM | POA: Diagnosis not present

## 2021-06-11 DIAGNOSIS — H2512 Age-related nuclear cataract, left eye: Secondary | ICD-10-CM | POA: Diagnosis not present

## 2021-06-11 DIAGNOSIS — H25811 Combined forms of age-related cataract, right eye: Secondary | ICD-10-CM | POA: Diagnosis not present

## 2021-06-11 DIAGNOSIS — H524 Presbyopia: Secondary | ICD-10-CM | POA: Diagnosis not present

## 2021-06-28 DIAGNOSIS — M545 Low back pain, unspecified: Secondary | ICD-10-CM | POA: Diagnosis not present

## 2021-06-28 DIAGNOSIS — M5441 Lumbago with sciatica, right side: Secondary | ICD-10-CM | POA: Diagnosis not present

## 2021-07-05 DIAGNOSIS — M545 Low back pain, unspecified: Secondary | ICD-10-CM | POA: Diagnosis not present

## 2021-07-08 DIAGNOSIS — Z23 Encounter for immunization: Secondary | ICD-10-CM | POA: Diagnosis not present

## 2021-07-11 DIAGNOSIS — M5441 Lumbago with sciatica, right side: Secondary | ICD-10-CM | POA: Diagnosis not present

## 2021-07-12 DIAGNOSIS — Z6828 Body mass index (BMI) 28.0-28.9, adult: Secondary | ICD-10-CM | POA: Diagnosis not present

## 2021-07-12 DIAGNOSIS — M5416 Radiculopathy, lumbar region: Secondary | ICD-10-CM | POA: Diagnosis not present

## 2021-07-12 DIAGNOSIS — I10 Essential (primary) hypertension: Secondary | ICD-10-CM | POA: Diagnosis not present

## 2021-08-12 DIAGNOSIS — E782 Mixed hyperlipidemia: Secondary | ICD-10-CM | POA: Diagnosis not present

## 2021-08-12 DIAGNOSIS — E1121 Type 2 diabetes mellitus with diabetic nephropathy: Secondary | ICD-10-CM | POA: Diagnosis not present

## 2021-08-12 DIAGNOSIS — Z7984 Long term (current) use of oral hypoglycemic drugs: Secondary | ICD-10-CM | POA: Diagnosis not present

## 2021-08-13 DIAGNOSIS — Z0000 Encounter for general adult medical examination without abnormal findings: Secondary | ICD-10-CM | POA: Diagnosis not present

## 2021-10-03 DIAGNOSIS — M5117 Intervertebral disc disorders with radiculopathy, lumbosacral region: Secondary | ICD-10-CM | POA: Diagnosis not present

## 2021-10-03 DIAGNOSIS — M4807 Spinal stenosis, lumbosacral region: Secondary | ICD-10-CM | POA: Diagnosis not present

## 2021-10-03 DIAGNOSIS — M5416 Radiculopathy, lumbar region: Secondary | ICD-10-CM | POA: Diagnosis not present

## 2021-10-15 DIAGNOSIS — B078 Other viral warts: Secondary | ICD-10-CM | POA: Diagnosis not present

## 2021-12-24 DIAGNOSIS — I10 Essential (primary) hypertension: Secondary | ICD-10-CM | POA: Diagnosis not present

## 2022-02-07 DIAGNOSIS — J209 Acute bronchitis, unspecified: Secondary | ICD-10-CM | POA: Diagnosis not present

## 2022-02-07 DIAGNOSIS — J019 Acute sinusitis, unspecified: Secondary | ICD-10-CM | POA: Diagnosis not present

## 2022-02-07 DIAGNOSIS — R059 Cough, unspecified: Secondary | ICD-10-CM | POA: Diagnosis not present

## 2022-02-07 DIAGNOSIS — Z8709 Personal history of other diseases of the respiratory system: Secondary | ICD-10-CM | POA: Diagnosis not present

## 2022-02-11 DIAGNOSIS — Z20822 Contact with and (suspected) exposure to covid-19: Secondary | ICD-10-CM | POA: Diagnosis not present

## 2022-02-19 DIAGNOSIS — Z20822 Contact with and (suspected) exposure to covid-19: Secondary | ICD-10-CM | POA: Diagnosis not present

## 2022-02-22 DIAGNOSIS — Z23 Encounter for immunization: Secondary | ICD-10-CM | POA: Diagnosis not present

## 2022-03-04 DIAGNOSIS — N390 Urinary tract infection, site not specified: Secondary | ICD-10-CM | POA: Diagnosis not present

## 2022-03-04 DIAGNOSIS — R300 Dysuria: Secondary | ICD-10-CM | POA: Diagnosis not present

## 2022-03-11 DIAGNOSIS — H52223 Regular astigmatism, bilateral: Secondary | ICD-10-CM | POA: Diagnosis not present

## 2022-03-11 DIAGNOSIS — H25811 Combined forms of age-related cataract, right eye: Secondary | ICD-10-CM | POA: Diagnosis not present

## 2022-03-11 DIAGNOSIS — H524 Presbyopia: Secondary | ICD-10-CM | POA: Diagnosis not present

## 2022-03-11 DIAGNOSIS — H5211 Myopia, right eye: Secondary | ICD-10-CM | POA: Diagnosis not present

## 2022-03-11 DIAGNOSIS — E119 Type 2 diabetes mellitus without complications: Secondary | ICD-10-CM | POA: Diagnosis not present

## 2022-03-11 DIAGNOSIS — H353132 Nonexudative age-related macular degeneration, bilateral, intermediate dry stage: Secondary | ICD-10-CM | POA: Diagnosis not present

## 2022-03-11 DIAGNOSIS — H2512 Age-related nuclear cataract, left eye: Secondary | ICD-10-CM | POA: Diagnosis not present

## 2022-03-11 DIAGNOSIS — H35372 Puckering of macula, left eye: Secondary | ICD-10-CM | POA: Diagnosis not present

## 2022-04-01 DIAGNOSIS — N3000 Acute cystitis without hematuria: Secondary | ICD-10-CM | POA: Diagnosis not present

## 2022-04-01 DIAGNOSIS — I129 Hypertensive chronic kidney disease with stage 1 through stage 4 chronic kidney disease, or unspecified chronic kidney disease: Secondary | ICD-10-CM | POA: Diagnosis not present

## 2022-04-01 DIAGNOSIS — N1831 Chronic kidney disease, stage 3a: Secondary | ICD-10-CM | POA: Diagnosis not present

## 2022-04-01 DIAGNOSIS — N183 Chronic kidney disease, stage 3 unspecified: Secondary | ICD-10-CM | POA: Diagnosis not present

## 2022-04-01 DIAGNOSIS — E1122 Type 2 diabetes mellitus with diabetic chronic kidney disease: Secondary | ICD-10-CM | POA: Diagnosis not present

## 2022-04-03 DIAGNOSIS — N189 Chronic kidney disease, unspecified: Secondary | ICD-10-CM | POA: Diagnosis not present

## 2022-04-03 DIAGNOSIS — N200 Calculus of kidney: Secondary | ICD-10-CM | POA: Diagnosis not present

## 2022-04-03 DIAGNOSIS — N1831 Chronic kidney disease, stage 3a: Secondary | ICD-10-CM | POA: Diagnosis not present

## 2022-06-09 DIAGNOSIS — I10 Essential (primary) hypertension: Secondary | ICD-10-CM | POA: Diagnosis not present

## 2022-06-09 DIAGNOSIS — E1165 Type 2 diabetes mellitus with hyperglycemia: Secondary | ICD-10-CM | POA: Diagnosis not present

## 2022-06-09 DIAGNOSIS — E782 Mixed hyperlipidemia: Secondary | ICD-10-CM | POA: Diagnosis not present

## 2022-06-09 DIAGNOSIS — N183 Chronic kidney disease, stage 3 unspecified: Secondary | ICD-10-CM | POA: Diagnosis not present

## 2022-06-15 DIAGNOSIS — J0110 Acute frontal sinusitis, unspecified: Secondary | ICD-10-CM | POA: Diagnosis not present

## 2022-06-30 DIAGNOSIS — N1831 Chronic kidney disease, stage 3a: Secondary | ICD-10-CM | POA: Diagnosis not present

## 2022-07-02 DIAGNOSIS — E1122 Type 2 diabetes mellitus with diabetic chronic kidney disease: Secondary | ICD-10-CM | POA: Diagnosis not present

## 2022-07-02 DIAGNOSIS — N1831 Chronic kidney disease, stage 3a: Secondary | ICD-10-CM | POA: Diagnosis not present

## 2022-07-02 DIAGNOSIS — R801 Persistent proteinuria, unspecified: Secondary | ICD-10-CM | POA: Diagnosis not present

## 2022-07-02 DIAGNOSIS — N183 Chronic kidney disease, stage 3 unspecified: Secondary | ICD-10-CM | POA: Diagnosis not present

## 2022-07-02 DIAGNOSIS — I129 Hypertensive chronic kidney disease with stage 1 through stage 4 chronic kidney disease, or unspecified chronic kidney disease: Secondary | ICD-10-CM | POA: Diagnosis not present

## 2022-07-07 IMAGING — MG DIGITAL SCREENING BILAT W/ TOMO W/ CAD
8 series · 8 of 24 positions shown · non-contrast
Comparison: Previous exam(s).

CLINICAL DATA: Screening.

EXAM:
DIGITAL SCREENING BILATERAL MAMMOGRAM WITH TOMO AND CAD

[R CC synth-2D]
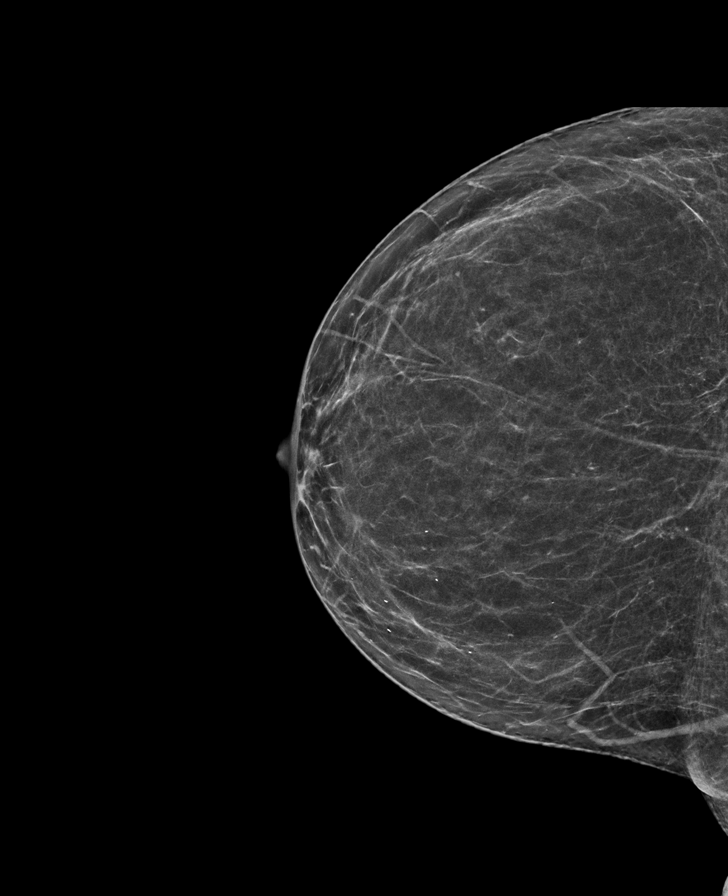

[L MLO synth-2D]
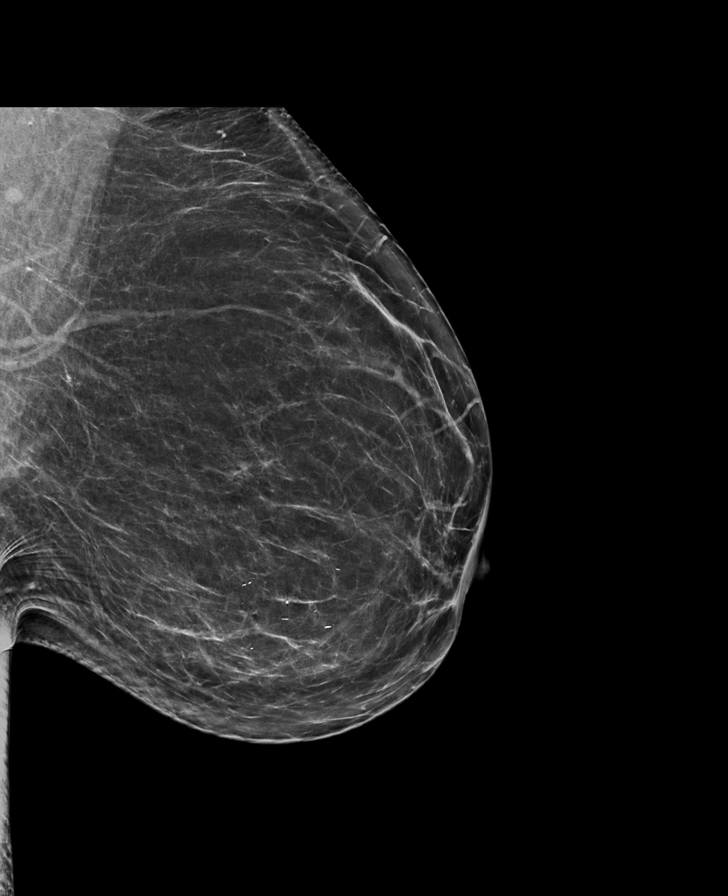

[L CC synth-2D]
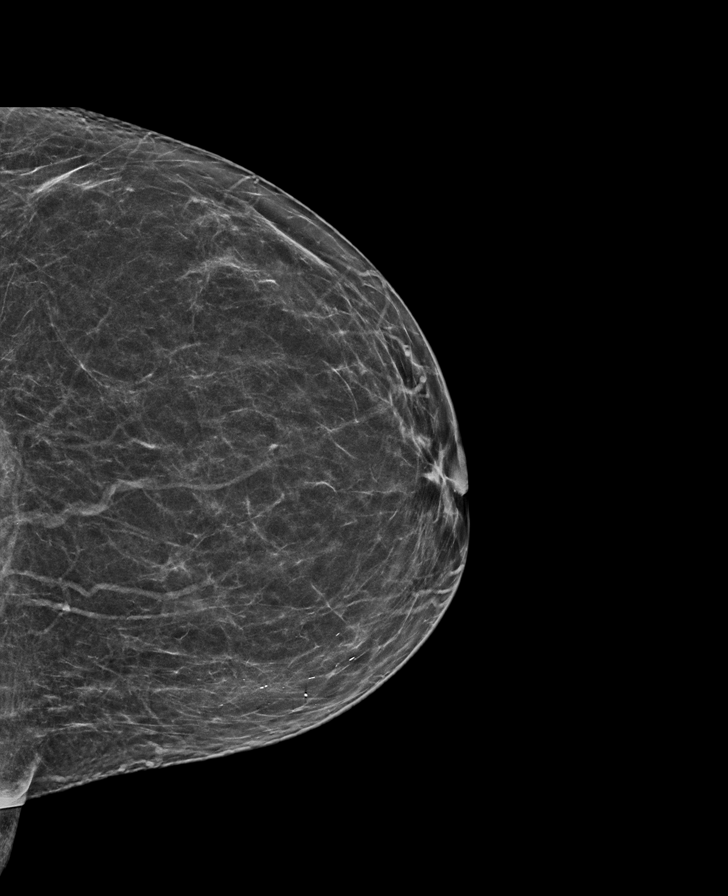

[R MLO synth-2D]
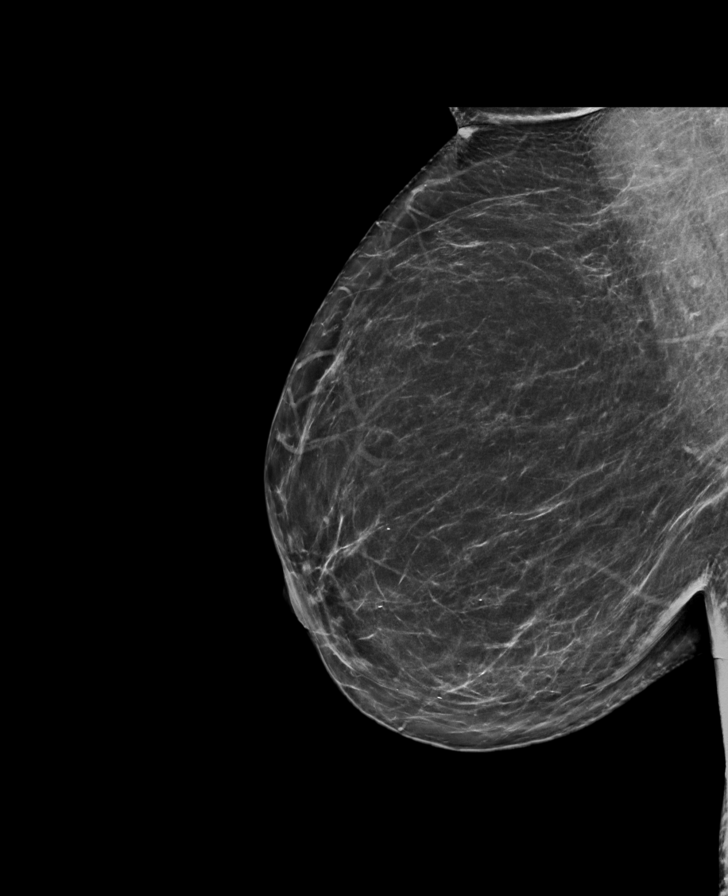

[L MLO tomo · tomo slice 39/78.0]
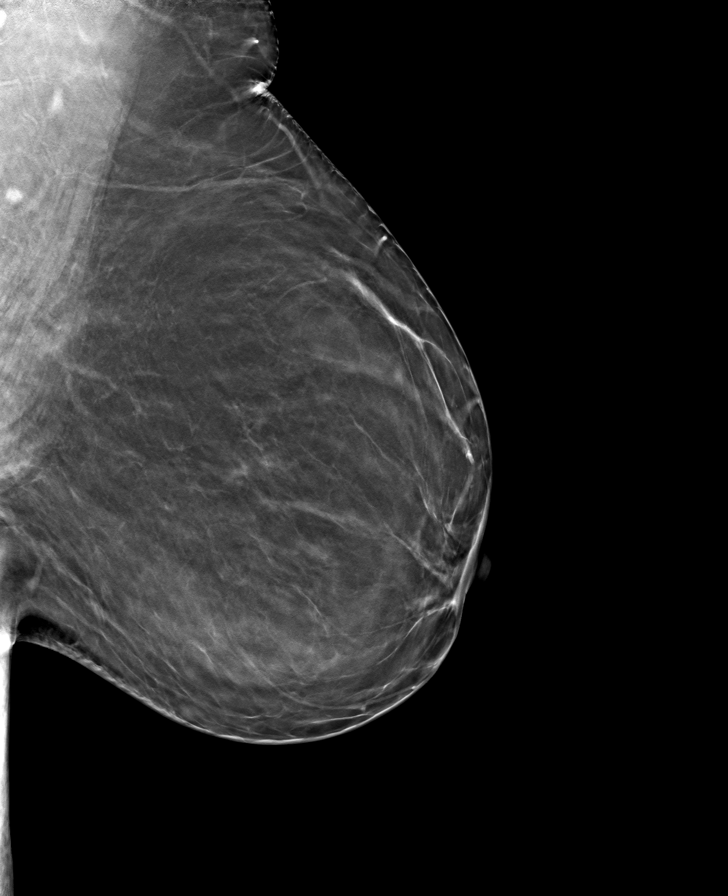

[L CC tomo · tomo slice 33/65.0]
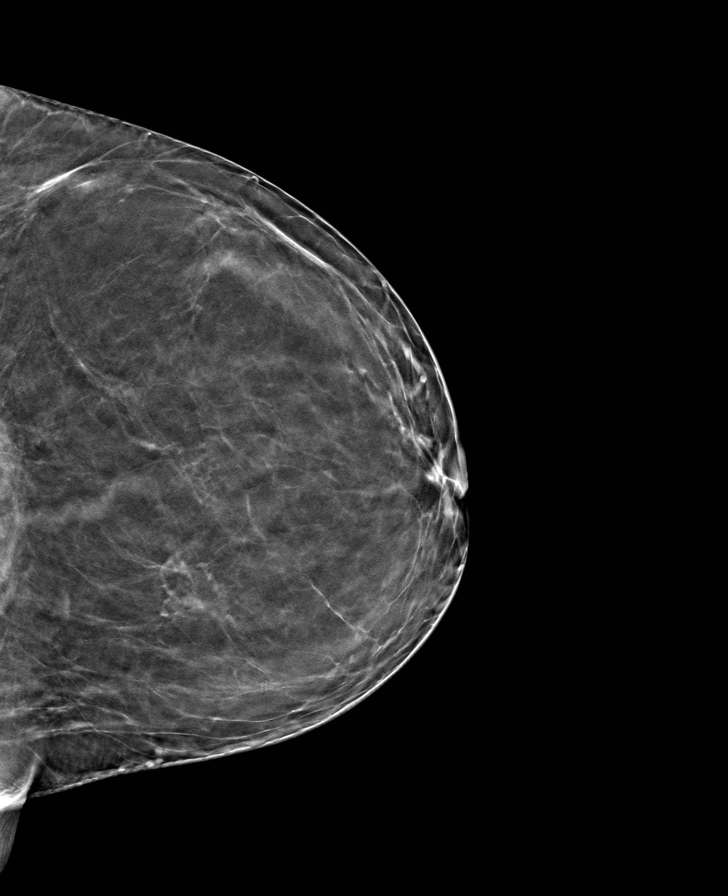

[R MLO tomo · tomo slice 37/74.0]
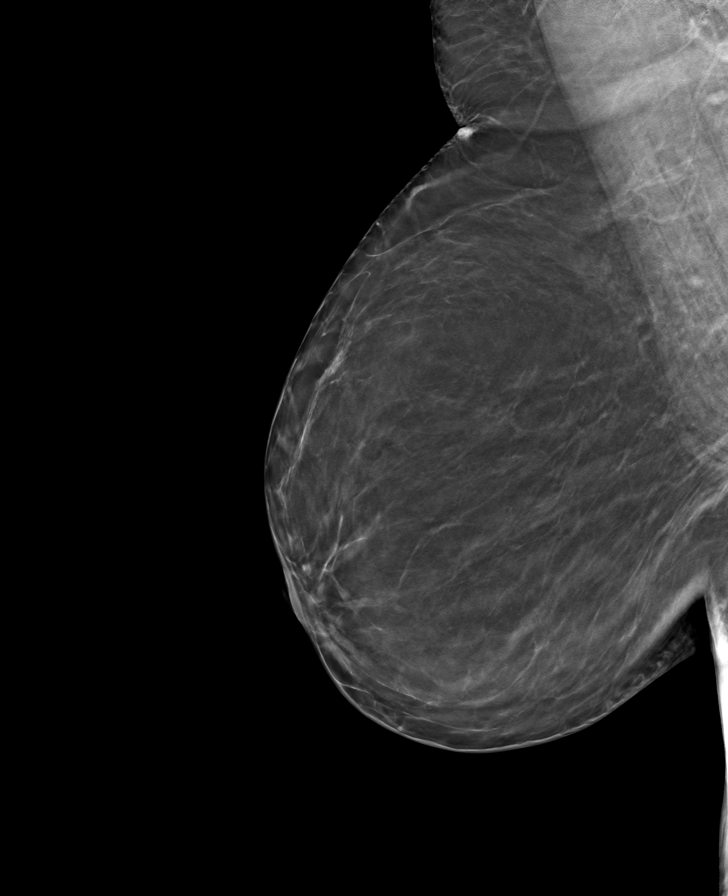

[R CC tomo · tomo slice 31/61.0]
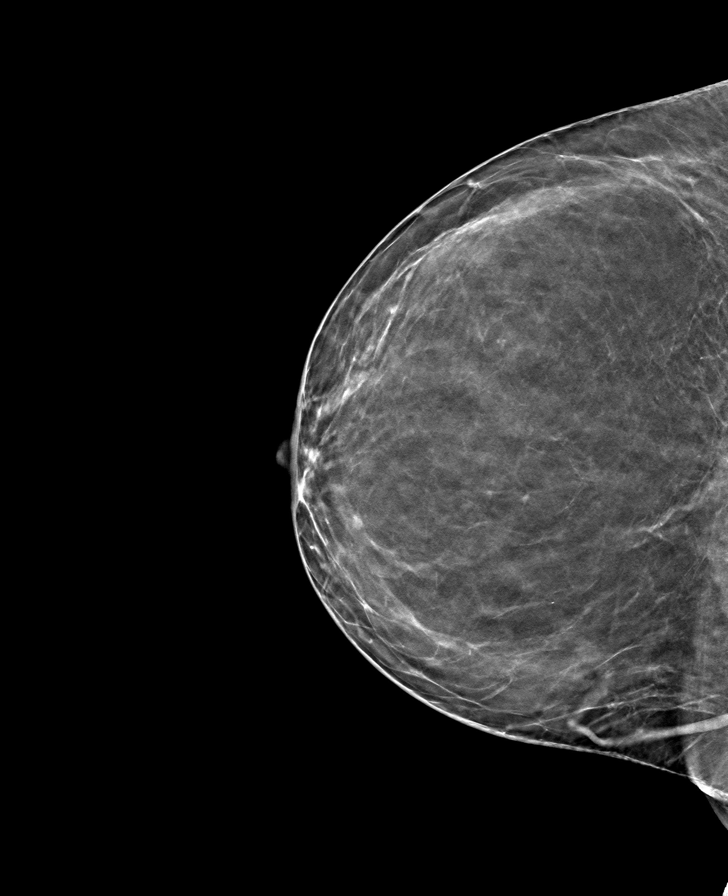

[8 of 24 positions shown; findings below may reference images not displayed]

ACR Breast Density Category b: There are scattered areas of
fibroglandular density.
FINDINGS: There are no findings suspicious for malignancy. Images were
processed with CAD.
IMPRESSION: No mammographic evidence of malignancy. A result letter of this
screening mammogram will be mailed directly to the patient.

RECOMMENDATION:
Screening mammogram in one year. (Code:[id number])

BI-RADS CATEGORY  1: Negative.

## 2022-07-24 DIAGNOSIS — Z23 Encounter for immunization: Secondary | ICD-10-CM | POA: Diagnosis not present

## 2022-09-16 DIAGNOSIS — H25811 Combined forms of age-related cataract, right eye: Secondary | ICD-10-CM | POA: Diagnosis not present

## 2022-09-16 DIAGNOSIS — E119 Type 2 diabetes mellitus without complications: Secondary | ICD-10-CM | POA: Diagnosis not present

## 2022-09-16 DIAGNOSIS — H52223 Regular astigmatism, bilateral: Secondary | ICD-10-CM | POA: Diagnosis not present

## 2022-09-16 DIAGNOSIS — H2512 Age-related nuclear cataract, left eye: Secondary | ICD-10-CM | POA: Diagnosis not present

## 2022-09-16 DIAGNOSIS — H5211 Myopia, right eye: Secondary | ICD-10-CM | POA: Diagnosis not present

## 2022-09-16 DIAGNOSIS — H524 Presbyopia: Secondary | ICD-10-CM | POA: Diagnosis not present

## 2022-09-16 DIAGNOSIS — H43812 Vitreous degeneration, left eye: Secondary | ICD-10-CM | POA: Diagnosis not present

## 2022-09-29 DIAGNOSIS — E1121 Type 2 diabetes mellitus with diabetic nephropathy: Secondary | ICD-10-CM | POA: Diagnosis not present

## 2022-09-29 DIAGNOSIS — E782 Mixed hyperlipidemia: Secondary | ICD-10-CM | POA: Diagnosis not present

## 2022-09-29 DIAGNOSIS — I10 Essential (primary) hypertension: Secondary | ICD-10-CM | POA: Diagnosis not present

## 2022-10-01 DIAGNOSIS — Z0000 Encounter for general adult medical examination without abnormal findings: Secondary | ICD-10-CM | POA: Diagnosis not present

## 2022-10-01 DIAGNOSIS — E782 Mixed hyperlipidemia: Secondary | ICD-10-CM | POA: Diagnosis not present

## 2022-10-01 DIAGNOSIS — I10 Essential (primary) hypertension: Secondary | ICD-10-CM | POA: Diagnosis not present

## 2022-10-01 DIAGNOSIS — M62838 Other muscle spasm: Secondary | ICD-10-CM | POA: Diagnosis not present

## 2022-10-01 DIAGNOSIS — I2510 Atherosclerotic heart disease of native coronary artery without angina pectoris: Secondary | ICD-10-CM | POA: Diagnosis not present

## 2022-10-01 DIAGNOSIS — Z1211 Encounter for screening for malignant neoplasm of colon: Secondary | ICD-10-CM | POA: Diagnosis not present

## 2022-10-01 DIAGNOSIS — E1122 Type 2 diabetes mellitus with diabetic chronic kidney disease: Secondary | ICD-10-CM | POA: Diagnosis not present

## 2022-10-01 DIAGNOSIS — N183 Chronic kidney disease, stage 3 unspecified: Secondary | ICD-10-CM | POA: Diagnosis not present

## 2022-10-01 DIAGNOSIS — Z1231 Encounter for screening mammogram for malignant neoplasm of breast: Secondary | ICD-10-CM | POA: Diagnosis not present

## 2022-10-01 DIAGNOSIS — Z13820 Encounter for screening for osteoporosis: Secondary | ICD-10-CM | POA: Diagnosis not present

## 2022-10-01 DIAGNOSIS — J4520 Mild intermittent asthma, uncomplicated: Secondary | ICD-10-CM | POA: Diagnosis not present

## 2022-10-01 DIAGNOSIS — E1121 Type 2 diabetes mellitus with diabetic nephropathy: Secondary | ICD-10-CM | POA: Diagnosis not present

## 2022-10-03 ENCOUNTER — Other Ambulatory Visit: Payer: Self-pay

## 2022-10-03 ENCOUNTER — Other Ambulatory Visit: Payer: Self-pay | Admitting: Family Medicine

## 2022-10-03 DIAGNOSIS — Z13820 Encounter for screening for osteoporosis: Secondary | ICD-10-CM

## 2022-10-03 DIAGNOSIS — Z1231 Encounter for screening mammogram for malignant neoplasm of breast: Secondary | ICD-10-CM

## 2022-10-07 DIAGNOSIS — M160 Bilateral primary osteoarthritis of hip: Secondary | ICD-10-CM | POA: Diagnosis not present

## 2022-10-08 ENCOUNTER — Ambulatory Visit
Admission: RE | Admit: 2022-10-08 | Discharge: 2022-10-08 | Disposition: A | Discharge status: Home / Self Care | Payer: Medicare Other | Source: Ambulatory Visit | Attending: Family Medicine | Admitting: Family Medicine

## 2022-10-08 DIAGNOSIS — M85852 Other specified disorders of bone density and structure, left thigh: Secondary | ICD-10-CM | POA: Diagnosis not present

## 2022-10-08 DIAGNOSIS — Z780 Asymptomatic menopausal state: Secondary | ICD-10-CM | POA: Diagnosis not present

## 2022-10-08 DIAGNOSIS — Z13820 Encounter for screening for osteoporosis: Secondary | ICD-10-CM

## 2022-10-09 DIAGNOSIS — M25552 Pain in left hip: Secondary | ICD-10-CM | POA: Diagnosis not present

## 2022-10-15 DIAGNOSIS — M25552 Pain in left hip: Secondary | ICD-10-CM | POA: Diagnosis not present

## 2022-11-03 DIAGNOSIS — H838X3 Other specified diseases of inner ear, bilateral: Secondary | ICD-10-CM | POA: Diagnosis not present

## 2022-11-03 DIAGNOSIS — H903 Sensorineural hearing loss, bilateral: Secondary | ICD-10-CM | POA: Diagnosis not present

## 2022-11-04 DIAGNOSIS — N1831 Chronic kidney disease, stage 3a: Secondary | ICD-10-CM | POA: Diagnosis not present

## 2022-11-05 DIAGNOSIS — I129 Hypertensive chronic kidney disease with stage 1 through stage 4 chronic kidney disease, or unspecified chronic kidney disease: Secondary | ICD-10-CM | POA: Diagnosis not present

## 2022-11-05 DIAGNOSIS — E1122 Type 2 diabetes mellitus with diabetic chronic kidney disease: Secondary | ICD-10-CM | POA: Diagnosis not present

## 2022-11-05 DIAGNOSIS — N1831 Chronic kidney disease, stage 3a: Secondary | ICD-10-CM | POA: Diagnosis not present

## 2022-11-05 DIAGNOSIS — R801 Persistent proteinuria, unspecified: Secondary | ICD-10-CM | POA: Diagnosis not present

## 2022-11-26 ENCOUNTER — Ambulatory Visit: Payer: Medicare Other

## 2022-12-01 ENCOUNTER — Ambulatory Visit
Admission: RE | Admit: 2022-12-01 | Discharge: 2022-12-01 | Disposition: A | Discharge status: Home / Self Care | Payer: Medicare Other | Source: Ambulatory Visit | Attending: Family Medicine | Admitting: Family Medicine

## 2022-12-01 DIAGNOSIS — Z1231 Encounter for screening mammogram for malignant neoplasm of breast: Secondary | ICD-10-CM

## 2022-12-22 DIAGNOSIS — E782 Mixed hyperlipidemia: Secondary | ICD-10-CM | POA: Diagnosis not present

## 2022-12-22 DIAGNOSIS — E1165 Type 2 diabetes mellitus with hyperglycemia: Secondary | ICD-10-CM | POA: Diagnosis not present

## 2022-12-24 DIAGNOSIS — E782 Mixed hyperlipidemia: Secondary | ICD-10-CM | POA: Diagnosis not present

## 2022-12-24 DIAGNOSIS — N183 Chronic kidney disease, stage 3 unspecified: Secondary | ICD-10-CM | POA: Diagnosis not present

## 2022-12-24 DIAGNOSIS — E1121 Type 2 diabetes mellitus with diabetic nephropathy: Secondary | ICD-10-CM | POA: Diagnosis not present

## 2022-12-24 DIAGNOSIS — R809 Proteinuria, unspecified: Secondary | ICD-10-CM | POA: Diagnosis not present

## 2022-12-24 DIAGNOSIS — I10 Essential (primary) hypertension: Secondary | ICD-10-CM | POA: Diagnosis not present

## 2022-12-24 DIAGNOSIS — E1122 Type 2 diabetes mellitus with diabetic chronic kidney disease: Secondary | ICD-10-CM | POA: Diagnosis not present

## 2023-01-20 DIAGNOSIS — L304 Erythema intertrigo: Secondary | ICD-10-CM | POA: Diagnosis not present

## 2023-02-10 DIAGNOSIS — M67911 Unspecified disorder of synovium and tendon, right shoulder: Secondary | ICD-10-CM | POA: Diagnosis not present

## 2023-02-10 DIAGNOSIS — M25561 Pain in right knee: Secondary | ICD-10-CM | POA: Diagnosis not present

## 2023-02-18 DIAGNOSIS — M25611 Stiffness of right shoulder, not elsewhere classified: Secondary | ICD-10-CM | POA: Diagnosis not present

## 2023-02-18 DIAGNOSIS — S46011D Strain of muscle(s) and tendon(s) of the rotator cuff of right shoulder, subsequent encounter: Secondary | ICD-10-CM | POA: Diagnosis not present

## 2023-02-18 DIAGNOSIS — M6281 Muscle weakness (generalized): Secondary | ICD-10-CM | POA: Diagnosis not present

## 2023-02-18 DIAGNOSIS — M25661 Stiffness of right knee, not elsewhere classified: Secondary | ICD-10-CM | POA: Diagnosis not present

## 2023-02-23 DIAGNOSIS — M6281 Muscle weakness (generalized): Secondary | ICD-10-CM | POA: Diagnosis not present

## 2023-02-23 DIAGNOSIS — M25661 Stiffness of right knee, not elsewhere classified: Secondary | ICD-10-CM | POA: Diagnosis not present

## 2023-02-23 DIAGNOSIS — M25611 Stiffness of right shoulder, not elsewhere classified: Secondary | ICD-10-CM | POA: Diagnosis not present

## 2023-02-23 DIAGNOSIS — S46011D Strain of muscle(s) and tendon(s) of the rotator cuff of right shoulder, subsequent encounter: Secondary | ICD-10-CM | POA: Diagnosis not present

## 2023-02-24 DIAGNOSIS — M6281 Muscle weakness (generalized): Secondary | ICD-10-CM | POA: Diagnosis not present

## 2023-02-24 DIAGNOSIS — S46011D Strain of muscle(s) and tendon(s) of the rotator cuff of right shoulder, subsequent encounter: Secondary | ICD-10-CM | POA: Diagnosis not present

## 2023-02-24 DIAGNOSIS — M25661 Stiffness of right knee, not elsewhere classified: Secondary | ICD-10-CM | POA: Diagnosis not present

## 2023-02-24 DIAGNOSIS — M25611 Stiffness of right shoulder, not elsewhere classified: Secondary | ICD-10-CM | POA: Diagnosis not present

## 2023-03-04 DIAGNOSIS — M7122 Synovial cyst of popliteal space [Baker], left knee: Secondary | ICD-10-CM | POA: Diagnosis not present

## 2023-03-04 DIAGNOSIS — S46011D Strain of muscle(s) and tendon(s) of the rotator cuff of right shoulder, subsequent encounter: Secondary | ICD-10-CM | POA: Diagnosis not present

## 2023-03-04 DIAGNOSIS — M25661 Stiffness of right knee, not elsewhere classified: Secondary | ICD-10-CM | POA: Diagnosis not present

## 2023-03-04 DIAGNOSIS — M25611 Stiffness of right shoulder, not elsewhere classified: Secondary | ICD-10-CM | POA: Diagnosis not present

## 2023-03-04 DIAGNOSIS — M6281 Muscle weakness (generalized): Secondary | ICD-10-CM | POA: Diagnosis not present

## 2023-03-11 DIAGNOSIS — M6281 Muscle weakness (generalized): Secondary | ICD-10-CM | POA: Diagnosis not present

## 2023-03-11 DIAGNOSIS — S46011D Strain of muscle(s) and tendon(s) of the rotator cuff of right shoulder, subsequent encounter: Secondary | ICD-10-CM | POA: Diagnosis not present

## 2023-03-11 DIAGNOSIS — M25661 Stiffness of right knee, not elsewhere classified: Secondary | ICD-10-CM | POA: Diagnosis not present

## 2023-03-11 DIAGNOSIS — M25611 Stiffness of right shoulder, not elsewhere classified: Secondary | ICD-10-CM | POA: Diagnosis not present

## 2023-03-13 DIAGNOSIS — M6281 Muscle weakness (generalized): Secondary | ICD-10-CM | POA: Diagnosis not present

## 2023-03-13 DIAGNOSIS — S46011D Strain of muscle(s) and tendon(s) of the rotator cuff of right shoulder, subsequent encounter: Secondary | ICD-10-CM | POA: Diagnosis not present

## 2023-03-13 DIAGNOSIS — M25611 Stiffness of right shoulder, not elsewhere classified: Secondary | ICD-10-CM | POA: Diagnosis not present

## 2023-03-13 DIAGNOSIS — M25661 Stiffness of right knee, not elsewhere classified: Secondary | ICD-10-CM | POA: Diagnosis not present

## 2023-03-18 DIAGNOSIS — M25611 Stiffness of right shoulder, not elsewhere classified: Secondary | ICD-10-CM | POA: Diagnosis not present

## 2023-03-18 DIAGNOSIS — M25661 Stiffness of right knee, not elsewhere classified: Secondary | ICD-10-CM | POA: Diagnosis not present

## 2023-03-18 DIAGNOSIS — M6281 Muscle weakness (generalized): Secondary | ICD-10-CM | POA: Diagnosis not present

## 2023-03-20 DIAGNOSIS — M25661 Stiffness of right knee, not elsewhere classified: Secondary | ICD-10-CM | POA: Diagnosis not present

## 2023-03-20 DIAGNOSIS — S46011D Strain of muscle(s) and tendon(s) of the rotator cuff of right shoulder, subsequent encounter: Secondary | ICD-10-CM | POA: Diagnosis not present

## 2023-03-20 DIAGNOSIS — M25611 Stiffness of right shoulder, not elsewhere classified: Secondary | ICD-10-CM | POA: Diagnosis not present

## 2023-03-20 DIAGNOSIS — M6281 Muscle weakness (generalized): Secondary | ICD-10-CM | POA: Diagnosis not present

## 2023-03-23 DIAGNOSIS — Z7984 Long term (current) use of oral hypoglycemic drugs: Secondary | ICD-10-CM | POA: Diagnosis not present

## 2023-03-23 DIAGNOSIS — E1121 Type 2 diabetes mellitus with diabetic nephropathy: Secondary | ICD-10-CM | POA: Diagnosis not present

## 2023-03-24 DIAGNOSIS — H35413 Lattice degeneration of retina, bilateral: Secondary | ICD-10-CM | POA: Diagnosis not present

## 2023-03-24 DIAGNOSIS — H35372 Puckering of macula, left eye: Secondary | ICD-10-CM | POA: Diagnosis not present

## 2023-03-24 DIAGNOSIS — H353132 Nonexudative age-related macular degeneration, bilateral, intermediate dry stage: Secondary | ICD-10-CM | POA: Diagnosis not present

## 2023-03-24 DIAGNOSIS — H25811 Combined forms of age-related cataract, right eye: Secondary | ICD-10-CM | POA: Diagnosis not present

## 2023-03-24 DIAGNOSIS — H2512 Age-related nuclear cataract, left eye: Secondary | ICD-10-CM | POA: Diagnosis not present

## 2023-03-24 DIAGNOSIS — E119 Type 2 diabetes mellitus without complications: Secondary | ICD-10-CM | POA: Diagnosis not present

## 2023-03-24 DIAGNOSIS — M25511 Pain in right shoulder: Secondary | ICD-10-CM | POA: Diagnosis not present

## 2023-03-25 DIAGNOSIS — I10 Essential (primary) hypertension: Secondary | ICD-10-CM | POA: Diagnosis not present

## 2023-03-25 DIAGNOSIS — E1165 Type 2 diabetes mellitus with hyperglycemia: Secondary | ICD-10-CM | POA: Diagnosis not present

## 2023-03-30 DIAGNOSIS — M25611 Stiffness of right shoulder, not elsewhere classified: Secondary | ICD-10-CM | POA: Diagnosis not present

## 2023-03-30 DIAGNOSIS — S46011D Strain of muscle(s) and tendon(s) of the rotator cuff of right shoulder, subsequent encounter: Secondary | ICD-10-CM | POA: Diagnosis not present

## 2023-03-30 DIAGNOSIS — M25661 Stiffness of right knee, not elsewhere classified: Secondary | ICD-10-CM | POA: Diagnosis not present

## 2023-03-30 DIAGNOSIS — M6281 Muscle weakness (generalized): Secondary | ICD-10-CM | POA: Diagnosis not present

## 2023-04-06 DIAGNOSIS — M25661 Stiffness of right knee, not elsewhere classified: Secondary | ICD-10-CM | POA: Diagnosis not present

## 2023-04-06 DIAGNOSIS — S46011D Strain of muscle(s) and tendon(s) of the rotator cuff of right shoulder, subsequent encounter: Secondary | ICD-10-CM | POA: Diagnosis not present

## 2023-04-06 DIAGNOSIS — M6281 Muscle weakness (generalized): Secondary | ICD-10-CM | POA: Diagnosis not present

## 2023-04-06 DIAGNOSIS — M25611 Stiffness of right shoulder, not elsewhere classified: Secondary | ICD-10-CM | POA: Diagnosis not present

## 2023-04-08 DIAGNOSIS — S46011D Strain of muscle(s) and tendon(s) of the rotator cuff of right shoulder, subsequent encounter: Secondary | ICD-10-CM | POA: Diagnosis not present

## 2023-04-08 DIAGNOSIS — M25661 Stiffness of right knee, not elsewhere classified: Secondary | ICD-10-CM | POA: Diagnosis not present

## 2023-04-08 DIAGNOSIS — M25611 Stiffness of right shoulder, not elsewhere classified: Secondary | ICD-10-CM | POA: Diagnosis not present

## 2023-04-08 DIAGNOSIS — M6281 Muscle weakness (generalized): Secondary | ICD-10-CM | POA: Diagnosis not present

## 2023-04-13 DIAGNOSIS — M6281 Muscle weakness (generalized): Secondary | ICD-10-CM | POA: Diagnosis not present

## 2023-04-13 DIAGNOSIS — M25661 Stiffness of right knee, not elsewhere classified: Secondary | ICD-10-CM | POA: Diagnosis not present

## 2023-04-13 DIAGNOSIS — M25611 Stiffness of right shoulder, not elsewhere classified: Secondary | ICD-10-CM | POA: Diagnosis not present

## 2023-04-13 DIAGNOSIS — S46011D Strain of muscle(s) and tendon(s) of the rotator cuff of right shoulder, subsequent encounter: Secondary | ICD-10-CM | POA: Diagnosis not present

## 2023-04-17 DIAGNOSIS — M25661 Stiffness of right knee, not elsewhere classified: Secondary | ICD-10-CM | POA: Diagnosis not present

## 2023-04-17 DIAGNOSIS — S46011D Strain of muscle(s) and tendon(s) of the rotator cuff of right shoulder, subsequent encounter: Secondary | ICD-10-CM | POA: Diagnosis not present

## 2023-04-17 DIAGNOSIS — M6281 Muscle weakness (generalized): Secondary | ICD-10-CM | POA: Diagnosis not present

## 2023-04-17 DIAGNOSIS — M25611 Stiffness of right shoulder, not elsewhere classified: Secondary | ICD-10-CM | POA: Diagnosis not present

## 2023-04-17 DIAGNOSIS — J0141 Acute recurrent pansinusitis: Secondary | ICD-10-CM | POA: Diagnosis not present

## 2023-04-17 DIAGNOSIS — H1033 Unspecified acute conjunctivitis, bilateral: Secondary | ICD-10-CM | POA: Diagnosis not present

## 2023-04-29 DIAGNOSIS — N1831 Chronic kidney disease, stage 3a: Secondary | ICD-10-CM | POA: Diagnosis not present

## 2023-05-05 DIAGNOSIS — M67911 Unspecified disorder of synovium and tendon, right shoulder: Secondary | ICD-10-CM | POA: Diagnosis not present

## 2023-05-06 DIAGNOSIS — Z87891 Personal history of nicotine dependence: Secondary | ICD-10-CM | POA: Diagnosis not present

## 2023-05-06 DIAGNOSIS — E1122 Type 2 diabetes mellitus with diabetic chronic kidney disease: Secondary | ICD-10-CM | POA: Diagnosis not present

## 2023-05-06 DIAGNOSIS — I129 Hypertensive chronic kidney disease with stage 1 through stage 4 chronic kidney disease, or unspecified chronic kidney disease: Secondary | ICD-10-CM | POA: Diagnosis not present

## 2023-05-06 DIAGNOSIS — R801 Persistent proteinuria, unspecified: Secondary | ICD-10-CM | POA: Diagnosis not present

## 2023-05-06 DIAGNOSIS — Z7985 Long-term (current) use of injectable non-insulin antidiabetic drugs: Secondary | ICD-10-CM | POA: Diagnosis not present

## 2023-05-06 DIAGNOSIS — N1831 Chronic kidney disease, stage 3a: Secondary | ICD-10-CM | POA: Diagnosis not present

## 2023-05-06 DIAGNOSIS — Z7984 Long term (current) use of oral hypoglycemic drugs: Secondary | ICD-10-CM | POA: Diagnosis not present

## 2023-05-08 DIAGNOSIS — M25511 Pain in right shoulder: Secondary | ICD-10-CM | POA: Diagnosis not present

## 2023-05-27 DIAGNOSIS — M75121 Complete rotator cuff tear or rupture of right shoulder, not specified as traumatic: Secondary | ICD-10-CM | POA: Diagnosis not present

## 2023-07-08 DIAGNOSIS — M25551 Pain in right hip: Secondary | ICD-10-CM | POA: Diagnosis not present

## 2023-07-08 DIAGNOSIS — M25552 Pain in left hip: Secondary | ICD-10-CM | POA: Diagnosis not present

## 2023-07-08 DIAGNOSIS — M5459 Other low back pain: Secondary | ICD-10-CM | POA: Diagnosis not present

## 2023-07-08 DIAGNOSIS — M25511 Pain in right shoulder: Secondary | ICD-10-CM | POA: Diagnosis not present

## 2023-07-08 DIAGNOSIS — M6281 Muscle weakness (generalized): Secondary | ICD-10-CM | POA: Diagnosis not present

## 2023-07-09 DIAGNOSIS — E1121 Type 2 diabetes mellitus with diabetic nephropathy: Secondary | ICD-10-CM | POA: Diagnosis not present

## 2023-07-13 DIAGNOSIS — E782 Mixed hyperlipidemia: Secondary | ICD-10-CM | POA: Diagnosis not present

## 2023-07-13 DIAGNOSIS — N182 Chronic kidney disease, stage 2 (mild): Secondary | ICD-10-CM | POA: Diagnosis not present

## 2023-07-13 DIAGNOSIS — I10 Essential (primary) hypertension: Secondary | ICD-10-CM | POA: Diagnosis not present

## 2023-07-13 DIAGNOSIS — E1121 Type 2 diabetes mellitus with diabetic nephropathy: Secondary | ICD-10-CM | POA: Diagnosis not present

## 2023-07-13 DIAGNOSIS — Z23 Encounter for immunization: Secondary | ICD-10-CM | POA: Diagnosis not present

## 2023-07-14 DIAGNOSIS — M5459 Other low back pain: Secondary | ICD-10-CM | POA: Diagnosis not present

## 2023-07-14 DIAGNOSIS — M25552 Pain in left hip: Secondary | ICD-10-CM | POA: Diagnosis not present

## 2023-07-14 DIAGNOSIS — M25551 Pain in right hip: Secondary | ICD-10-CM | POA: Diagnosis not present

## 2023-07-14 DIAGNOSIS — M6281 Muscle weakness (generalized): Secondary | ICD-10-CM | POA: Diagnosis not present

## 2023-07-14 DIAGNOSIS — M25511 Pain in right shoulder: Secondary | ICD-10-CM | POA: Diagnosis not present

## 2023-07-16 DIAGNOSIS — M25552 Pain in left hip: Secondary | ICD-10-CM | POA: Diagnosis not present

## 2023-07-16 DIAGNOSIS — M6281 Muscle weakness (generalized): Secondary | ICD-10-CM | POA: Diagnosis not present

## 2023-07-16 DIAGNOSIS — M25551 Pain in right hip: Secondary | ICD-10-CM | POA: Diagnosis not present

## 2023-07-16 DIAGNOSIS — M5459 Other low back pain: Secondary | ICD-10-CM | POA: Diagnosis not present

## 2023-07-16 DIAGNOSIS — M25511 Pain in right shoulder: Secondary | ICD-10-CM | POA: Diagnosis not present

## 2023-07-20 DIAGNOSIS — M25511 Pain in right shoulder: Secondary | ICD-10-CM | POA: Diagnosis not present

## 2023-07-20 DIAGNOSIS — M25552 Pain in left hip: Secondary | ICD-10-CM | POA: Diagnosis not present

## 2023-07-20 DIAGNOSIS — M25551 Pain in right hip: Secondary | ICD-10-CM | POA: Diagnosis not present

## 2023-07-20 DIAGNOSIS — M6281 Muscle weakness (generalized): Secondary | ICD-10-CM | POA: Diagnosis not present

## 2023-07-20 DIAGNOSIS — M5459 Other low back pain: Secondary | ICD-10-CM | POA: Diagnosis not present

## 2023-07-22 DIAGNOSIS — M5459 Other low back pain: Secondary | ICD-10-CM | POA: Diagnosis not present

## 2023-07-22 DIAGNOSIS — M25551 Pain in right hip: Secondary | ICD-10-CM | POA: Diagnosis not present

## 2023-07-22 DIAGNOSIS — M25552 Pain in left hip: Secondary | ICD-10-CM | POA: Diagnosis not present

## 2023-07-22 DIAGNOSIS — M6281 Muscle weakness (generalized): Secondary | ICD-10-CM | POA: Diagnosis not present

## 2023-07-22 DIAGNOSIS — M25511 Pain in right shoulder: Secondary | ICD-10-CM | POA: Diagnosis not present

## 2023-07-27 DIAGNOSIS — M25552 Pain in left hip: Secondary | ICD-10-CM | POA: Diagnosis not present

## 2023-07-27 DIAGNOSIS — M25551 Pain in right hip: Secondary | ICD-10-CM | POA: Diagnosis not present

## 2023-07-27 DIAGNOSIS — M25511 Pain in right shoulder: Secondary | ICD-10-CM | POA: Diagnosis not present

## 2023-07-27 DIAGNOSIS — M6281 Muscle weakness (generalized): Secondary | ICD-10-CM | POA: Diagnosis not present

## 2023-07-27 DIAGNOSIS — M5459 Other low back pain: Secondary | ICD-10-CM | POA: Diagnosis not present

## 2023-07-29 DIAGNOSIS — M25511 Pain in right shoulder: Secondary | ICD-10-CM | POA: Diagnosis not present

## 2023-07-29 DIAGNOSIS — M25551 Pain in right hip: Secondary | ICD-10-CM | POA: Diagnosis not present

## 2023-07-29 DIAGNOSIS — M6281 Muscle weakness (generalized): Secondary | ICD-10-CM | POA: Diagnosis not present

## 2023-07-29 DIAGNOSIS — M25552 Pain in left hip: Secondary | ICD-10-CM | POA: Diagnosis not present

## 2023-07-29 DIAGNOSIS — M5459 Other low back pain: Secondary | ICD-10-CM | POA: Diagnosis not present

## 2023-08-03 DIAGNOSIS — M25551 Pain in right hip: Secondary | ICD-10-CM | POA: Diagnosis not present

## 2023-08-03 DIAGNOSIS — M25511 Pain in right shoulder: Secondary | ICD-10-CM | POA: Diagnosis not present

## 2023-08-03 DIAGNOSIS — M5459 Other low back pain: Secondary | ICD-10-CM | POA: Diagnosis not present

## 2023-08-03 DIAGNOSIS — M25552 Pain in left hip: Secondary | ICD-10-CM | POA: Diagnosis not present

## 2023-08-03 DIAGNOSIS — M6281 Muscle weakness (generalized): Secondary | ICD-10-CM | POA: Diagnosis not present

## 2023-08-04 DIAGNOSIS — M25552 Pain in left hip: Secondary | ICD-10-CM | POA: Diagnosis not present

## 2023-08-05 DIAGNOSIS — M25552 Pain in left hip: Secondary | ICD-10-CM | POA: Diagnosis not present

## 2023-08-05 DIAGNOSIS — M25511 Pain in right shoulder: Secondary | ICD-10-CM | POA: Diagnosis not present

## 2023-08-05 DIAGNOSIS — M5459 Other low back pain: Secondary | ICD-10-CM | POA: Diagnosis not present

## 2023-08-05 DIAGNOSIS — M25551 Pain in right hip: Secondary | ICD-10-CM | POA: Diagnosis not present

## 2023-08-05 DIAGNOSIS — M6281 Muscle weakness (generalized): Secondary | ICD-10-CM | POA: Diagnosis not present

## 2023-08-07 DIAGNOSIS — M1612 Unilateral primary osteoarthritis, left hip: Secondary | ICD-10-CM | POA: Diagnosis not present

## 2023-08-07 DIAGNOSIS — E1121 Type 2 diabetes mellitus with diabetic nephropathy: Secondary | ICD-10-CM | POA: Diagnosis not present

## 2023-08-07 DIAGNOSIS — Z01810 Encounter for preprocedural cardiovascular examination: Secondary | ICD-10-CM | POA: Diagnosis not present

## 2023-08-07 DIAGNOSIS — I10 Essential (primary) hypertension: Secondary | ICD-10-CM | POA: Diagnosis not present

## 2023-08-07 DIAGNOSIS — N1831 Chronic kidney disease, stage 3a: Secondary | ICD-10-CM | POA: Diagnosis not present

## 2023-08-07 DIAGNOSIS — Z96641 Presence of right artificial hip joint: Secondary | ICD-10-CM | POA: Diagnosis not present

## 2023-08-07 DIAGNOSIS — E782 Mixed hyperlipidemia: Secondary | ICD-10-CM | POA: Diagnosis not present

## 2023-08-07 DIAGNOSIS — J4520 Mild intermittent asthma, uncomplicated: Secondary | ICD-10-CM | POA: Diagnosis not present

## 2023-08-10 DIAGNOSIS — M25511 Pain in right shoulder: Secondary | ICD-10-CM | POA: Diagnosis not present

## 2023-08-10 DIAGNOSIS — M25552 Pain in left hip: Secondary | ICD-10-CM | POA: Diagnosis not present

## 2023-08-10 DIAGNOSIS — M6281 Muscle weakness (generalized): Secondary | ICD-10-CM | POA: Diagnosis not present

## 2023-08-10 DIAGNOSIS — M5459 Other low back pain: Secondary | ICD-10-CM | POA: Diagnosis not present

## 2023-08-10 DIAGNOSIS — M25551 Pain in right hip: Secondary | ICD-10-CM | POA: Diagnosis not present

## 2023-08-12 DIAGNOSIS — M25552 Pain in left hip: Secondary | ICD-10-CM | POA: Diagnosis not present

## 2023-08-12 DIAGNOSIS — M25551 Pain in right hip: Secondary | ICD-10-CM | POA: Diagnosis not present

## 2023-08-12 DIAGNOSIS — M5459 Other low back pain: Secondary | ICD-10-CM | POA: Diagnosis not present

## 2023-08-12 DIAGNOSIS — M25511 Pain in right shoulder: Secondary | ICD-10-CM | POA: Diagnosis not present

## 2023-08-12 DIAGNOSIS — M6281 Muscle weakness (generalized): Secondary | ICD-10-CM | POA: Diagnosis not present

## 2023-08-19 DIAGNOSIS — M5459 Other low back pain: Secondary | ICD-10-CM | POA: Diagnosis not present

## 2023-08-19 DIAGNOSIS — M25511 Pain in right shoulder: Secondary | ICD-10-CM | POA: Diagnosis not present

## 2023-08-19 DIAGNOSIS — M6281 Muscle weakness (generalized): Secondary | ICD-10-CM | POA: Diagnosis not present

## 2023-08-19 DIAGNOSIS — M25551 Pain in right hip: Secondary | ICD-10-CM | POA: Diagnosis not present

## 2023-08-19 DIAGNOSIS — M25552 Pain in left hip: Secondary | ICD-10-CM | POA: Diagnosis not present

## 2023-08-24 DIAGNOSIS — M5459 Other low back pain: Secondary | ICD-10-CM | POA: Diagnosis not present

## 2023-08-24 DIAGNOSIS — M25552 Pain in left hip: Secondary | ICD-10-CM | POA: Diagnosis not present

## 2023-08-24 DIAGNOSIS — M6281 Muscle weakness (generalized): Secondary | ICD-10-CM | POA: Diagnosis not present

## 2023-08-24 DIAGNOSIS — M25551 Pain in right hip: Secondary | ICD-10-CM | POA: Diagnosis not present

## 2023-08-24 DIAGNOSIS — M25511 Pain in right shoulder: Secondary | ICD-10-CM | POA: Diagnosis not present

## 2023-08-26 DIAGNOSIS — M25551 Pain in right hip: Secondary | ICD-10-CM | POA: Diagnosis not present

## 2023-08-26 DIAGNOSIS — M25552 Pain in left hip: Secondary | ICD-10-CM | POA: Diagnosis not present

## 2023-08-26 DIAGNOSIS — M25511 Pain in right shoulder: Secondary | ICD-10-CM | POA: Diagnosis not present

## 2023-08-26 DIAGNOSIS — M5459 Other low back pain: Secondary | ICD-10-CM | POA: Diagnosis not present

## 2023-08-26 DIAGNOSIS — M6281 Muscle weakness (generalized): Secondary | ICD-10-CM | POA: Diagnosis not present

## 2023-08-31 DIAGNOSIS — M6281 Muscle weakness (generalized): Secondary | ICD-10-CM | POA: Diagnosis not present

## 2023-08-31 DIAGNOSIS — M25552 Pain in left hip: Secondary | ICD-10-CM | POA: Diagnosis not present

## 2023-08-31 DIAGNOSIS — M25511 Pain in right shoulder: Secondary | ICD-10-CM | POA: Diagnosis not present

## 2023-08-31 DIAGNOSIS — M5459 Other low back pain: Secondary | ICD-10-CM | POA: Diagnosis not present

## 2023-08-31 DIAGNOSIS — M25551 Pain in right hip: Secondary | ICD-10-CM | POA: Diagnosis not present

## 2023-09-02 DIAGNOSIS — M25511 Pain in right shoulder: Secondary | ICD-10-CM | POA: Diagnosis not present

## 2023-09-02 DIAGNOSIS — M25552 Pain in left hip: Secondary | ICD-10-CM | POA: Diagnosis not present

## 2023-09-02 DIAGNOSIS — M5459 Other low back pain: Secondary | ICD-10-CM | POA: Diagnosis not present

## 2023-09-02 DIAGNOSIS — M25551 Pain in right hip: Secondary | ICD-10-CM | POA: Diagnosis not present

## 2023-09-02 DIAGNOSIS — M6281 Muscle weakness (generalized): Secondary | ICD-10-CM | POA: Diagnosis not present

## 2023-09-07 DIAGNOSIS — M25552 Pain in left hip: Secondary | ICD-10-CM | POA: Diagnosis not present

## 2023-09-07 DIAGNOSIS — M25511 Pain in right shoulder: Secondary | ICD-10-CM | POA: Diagnosis not present

## 2023-09-07 DIAGNOSIS — M25551 Pain in right hip: Secondary | ICD-10-CM | POA: Diagnosis not present

## 2023-09-07 DIAGNOSIS — M6281 Muscle weakness (generalized): Secondary | ICD-10-CM | POA: Diagnosis not present

## 2023-09-07 DIAGNOSIS — M5459 Other low back pain: Secondary | ICD-10-CM | POA: Diagnosis not present

## 2023-09-09 DIAGNOSIS — M25552 Pain in left hip: Secondary | ICD-10-CM | POA: Diagnosis not present

## 2023-09-09 DIAGNOSIS — M25551 Pain in right hip: Secondary | ICD-10-CM | POA: Diagnosis not present

## 2023-09-09 DIAGNOSIS — M6281 Muscle weakness (generalized): Secondary | ICD-10-CM | POA: Diagnosis not present

## 2023-09-09 DIAGNOSIS — M1612 Unilateral primary osteoarthritis, left hip: Secondary | ICD-10-CM | POA: Diagnosis not present

## 2023-09-09 DIAGNOSIS — Z471 Aftercare following joint replacement surgery: Secondary | ICD-10-CM | POA: Diagnosis not present

## 2023-09-09 DIAGNOSIS — M5459 Other low back pain: Secondary | ICD-10-CM | POA: Diagnosis not present

## 2023-09-09 DIAGNOSIS — M25511 Pain in right shoulder: Secondary | ICD-10-CM | POA: Diagnosis not present

## 2023-09-10 DIAGNOSIS — M1612 Unilateral primary osteoarthritis, left hip: Secondary | ICD-10-CM | POA: Diagnosis not present

## 2023-09-10 DIAGNOSIS — R262 Difficulty in walking, not elsewhere classified: Secondary | ICD-10-CM | POA: Diagnosis not present

## 2023-09-14 DIAGNOSIS — M6281 Muscle weakness (generalized): Secondary | ICD-10-CM | POA: Diagnosis not present

## 2023-09-14 DIAGNOSIS — M25511 Pain in right shoulder: Secondary | ICD-10-CM | POA: Diagnosis not present

## 2023-09-14 DIAGNOSIS — M25552 Pain in left hip: Secondary | ICD-10-CM | POA: Diagnosis not present

## 2023-09-14 DIAGNOSIS — M5459 Other low back pain: Secondary | ICD-10-CM | POA: Diagnosis not present

## 2023-09-14 DIAGNOSIS — M25551 Pain in right hip: Secondary | ICD-10-CM | POA: Diagnosis not present

## 2023-09-16 DIAGNOSIS — M6281 Muscle weakness (generalized): Secondary | ICD-10-CM | POA: Diagnosis not present

## 2023-09-16 DIAGNOSIS — M25552 Pain in left hip: Secondary | ICD-10-CM | POA: Diagnosis not present

## 2023-09-16 DIAGNOSIS — M25551 Pain in right hip: Secondary | ICD-10-CM | POA: Diagnosis not present

## 2023-09-16 DIAGNOSIS — M25511 Pain in right shoulder: Secondary | ICD-10-CM | POA: Diagnosis not present

## 2023-09-16 DIAGNOSIS — M5459 Other low back pain: Secondary | ICD-10-CM | POA: Diagnosis not present

## 2023-09-23 DIAGNOSIS — M6281 Muscle weakness (generalized): Secondary | ICD-10-CM | POA: Diagnosis not present

## 2023-09-23 DIAGNOSIS — H353132 Nonexudative age-related macular degeneration, bilateral, intermediate dry stage: Secondary | ICD-10-CM | POA: Diagnosis not present

## 2023-09-23 DIAGNOSIS — H35372 Puckering of macula, left eye: Secondary | ICD-10-CM | POA: Diagnosis not present

## 2023-09-23 DIAGNOSIS — H2512 Age-related nuclear cataract, left eye: Secondary | ICD-10-CM | POA: Diagnosis not present

## 2023-09-23 DIAGNOSIS — E119 Type 2 diabetes mellitus without complications: Secondary | ICD-10-CM | POA: Diagnosis not present

## 2023-09-23 DIAGNOSIS — M25551 Pain in right hip: Secondary | ICD-10-CM | POA: Diagnosis not present

## 2023-09-23 DIAGNOSIS — M25552 Pain in left hip: Secondary | ICD-10-CM | POA: Diagnosis not present

## 2023-09-23 DIAGNOSIS — M25511 Pain in right shoulder: Secondary | ICD-10-CM | POA: Diagnosis not present

## 2023-09-23 DIAGNOSIS — M5459 Other low back pain: Secondary | ICD-10-CM | POA: Diagnosis not present

## 2023-09-25 DIAGNOSIS — M25752 Osteophyte, left hip: Secondary | ICD-10-CM | POA: Diagnosis not present

## 2023-09-25 DIAGNOSIS — Z96642 Presence of left artificial hip joint: Secondary | ICD-10-CM | POA: Diagnosis not present

## 2023-09-25 DIAGNOSIS — M1612 Unilateral primary osteoarthritis, left hip: Secondary | ICD-10-CM | POA: Diagnosis not present

## 2023-09-25 HISTORY — PX: OTHER SURGICAL HISTORY: SHX169

## 2023-09-26 DIAGNOSIS — I129 Hypertensive chronic kidney disease with stage 1 through stage 4 chronic kidney disease, or unspecified chronic kidney disease: Secondary | ICD-10-CM | POA: Diagnosis not present

## 2023-09-26 DIAGNOSIS — M71352 Other bursal cyst, left hip: Secondary | ICD-10-CM | POA: Diagnosis not present

## 2023-09-26 DIAGNOSIS — M87052 Idiopathic aseptic necrosis of left femur: Secondary | ICD-10-CM | POA: Diagnosis not present

## 2023-09-26 DIAGNOSIS — E1122 Type 2 diabetes mellitus with diabetic chronic kidney disease: Secondary | ICD-10-CM | POA: Diagnosis not present

## 2023-09-26 DIAGNOSIS — Z7982 Long term (current) use of aspirin: Secondary | ICD-10-CM | POA: Diagnosis not present

## 2023-09-26 DIAGNOSIS — M71351 Other bursal cyst, right hip: Secondary | ICD-10-CM | POA: Diagnosis not present

## 2023-09-26 DIAGNOSIS — Z79891 Long term (current) use of opiate analgesic: Secondary | ICD-10-CM | POA: Diagnosis not present

## 2023-09-26 DIAGNOSIS — N182 Chronic kidney disease, stage 2 (mild): Secondary | ICD-10-CM | POA: Diagnosis not present

## 2023-09-26 DIAGNOSIS — Z7984 Long term (current) use of oral hypoglycemic drugs: Secondary | ICD-10-CM | POA: Diagnosis not present

## 2023-09-26 DIAGNOSIS — Z604 Social exclusion and rejection: Secondary | ICD-10-CM | POA: Diagnosis not present

## 2023-09-30 DIAGNOSIS — M71351 Other bursal cyst, right hip: Secondary | ICD-10-CM | POA: Diagnosis not present

## 2023-09-30 DIAGNOSIS — I129 Hypertensive chronic kidney disease with stage 1 through stage 4 chronic kidney disease, or unspecified chronic kidney disease: Secondary | ICD-10-CM | POA: Diagnosis not present

## 2023-09-30 DIAGNOSIS — M71352 Other bursal cyst, left hip: Secondary | ICD-10-CM | POA: Diagnosis not present

## 2023-09-30 DIAGNOSIS — E1122 Type 2 diabetes mellitus with diabetic chronic kidney disease: Secondary | ICD-10-CM | POA: Diagnosis not present

## 2023-09-30 DIAGNOSIS — N182 Chronic kidney disease, stage 2 (mild): Secondary | ICD-10-CM | POA: Diagnosis not present

## 2023-09-30 DIAGNOSIS — M87052 Idiopathic aseptic necrosis of left femur: Secondary | ICD-10-CM | POA: Diagnosis not present

## 2023-10-02 DIAGNOSIS — M71352 Other bursal cyst, left hip: Secondary | ICD-10-CM | POA: Diagnosis not present

## 2023-10-02 DIAGNOSIS — I129 Hypertensive chronic kidney disease with stage 1 through stage 4 chronic kidney disease, or unspecified chronic kidney disease: Secondary | ICD-10-CM | POA: Diagnosis not present

## 2023-10-02 DIAGNOSIS — M87052 Idiopathic aseptic necrosis of left femur: Secondary | ICD-10-CM | POA: Diagnosis not present

## 2023-10-02 DIAGNOSIS — E1122 Type 2 diabetes mellitus with diabetic chronic kidney disease: Secondary | ICD-10-CM | POA: Diagnosis not present

## 2023-10-02 DIAGNOSIS — N182 Chronic kidney disease, stage 2 (mild): Secondary | ICD-10-CM | POA: Diagnosis not present

## 2023-10-02 DIAGNOSIS — M71351 Other bursal cyst, right hip: Secondary | ICD-10-CM | POA: Diagnosis not present

## 2023-10-06 DIAGNOSIS — I129 Hypertensive chronic kidney disease with stage 1 through stage 4 chronic kidney disease, or unspecified chronic kidney disease: Secondary | ICD-10-CM | POA: Diagnosis not present

## 2023-10-06 DIAGNOSIS — I10 Essential (primary) hypertension: Secondary | ICD-10-CM | POA: Diagnosis not present

## 2023-10-06 DIAGNOSIS — Z0000 Encounter for general adult medical examination without abnormal findings: Secondary | ICD-10-CM | POA: Diagnosis not present

## 2023-10-06 DIAGNOSIS — E1121 Type 2 diabetes mellitus with diabetic nephropathy: Secondary | ICD-10-CM | POA: Diagnosis not present

## 2023-10-06 DIAGNOSIS — E782 Mixed hyperlipidemia: Secondary | ICD-10-CM | POA: Diagnosis not present

## 2023-10-06 DIAGNOSIS — M71351 Other bursal cyst, right hip: Secondary | ICD-10-CM | POA: Diagnosis not present

## 2023-10-06 DIAGNOSIS — N182 Chronic kidney disease, stage 2 (mild): Secondary | ICD-10-CM | POA: Diagnosis not present

## 2023-10-06 DIAGNOSIS — I2510 Atherosclerotic heart disease of native coronary artery without angina pectoris: Secondary | ICD-10-CM | POA: Diagnosis not present

## 2023-10-06 DIAGNOSIS — Z780 Asymptomatic menopausal state: Secondary | ICD-10-CM | POA: Diagnosis not present

## 2023-10-06 DIAGNOSIS — M1612 Unilateral primary osteoarthritis, left hip: Secondary | ICD-10-CM | POA: Diagnosis not present

## 2023-10-06 DIAGNOSIS — M25572 Pain in left ankle and joints of left foot: Secondary | ICD-10-CM | POA: Diagnosis not present

## 2023-10-06 DIAGNOSIS — M71352 Other bursal cyst, left hip: Secondary | ICD-10-CM | POA: Diagnosis not present

## 2023-10-06 DIAGNOSIS — K219 Gastro-esophageal reflux disease without esophagitis: Secondary | ICD-10-CM | POA: Diagnosis not present

## 2023-10-06 DIAGNOSIS — E1122 Type 2 diabetes mellitus with diabetic chronic kidney disease: Secondary | ICD-10-CM | POA: Diagnosis not present

## 2023-10-06 DIAGNOSIS — G4761 Periodic limb movement disorder: Secondary | ICD-10-CM | POA: Diagnosis not present

## 2023-10-06 DIAGNOSIS — Z23 Encounter for immunization: Secondary | ICD-10-CM | POA: Diagnosis not present

## 2023-10-06 DIAGNOSIS — Z1331 Encounter for screening for depression: Secondary | ICD-10-CM | POA: Diagnosis not present

## 2023-10-06 DIAGNOSIS — M87052 Idiopathic aseptic necrosis of left femur: Secondary | ICD-10-CM | POA: Diagnosis not present

## 2023-10-06 DIAGNOSIS — N1831 Chronic kidney disease, stage 3a: Secondary | ICD-10-CM | POA: Diagnosis not present

## 2023-10-07 DIAGNOSIS — Z471 Aftercare following joint replacement surgery: Secondary | ICD-10-CM | POA: Diagnosis not present

## 2023-10-07 DIAGNOSIS — M6281 Muscle weakness (generalized): Secondary | ICD-10-CM | POA: Diagnosis not present

## 2023-10-07 DIAGNOSIS — M5459 Other low back pain: Secondary | ICD-10-CM | POA: Diagnosis not present

## 2023-10-07 DIAGNOSIS — M25511 Pain in right shoulder: Secondary | ICD-10-CM | POA: Diagnosis not present

## 2023-10-07 DIAGNOSIS — M25552 Pain in left hip: Secondary | ICD-10-CM | POA: Diagnosis not present

## 2023-10-07 DIAGNOSIS — M25551 Pain in right hip: Secondary | ICD-10-CM | POA: Diagnosis not present

## 2023-10-19 DIAGNOSIS — M25552 Pain in left hip: Secondary | ICD-10-CM | POA: Diagnosis not present

## 2023-10-19 DIAGNOSIS — M25551 Pain in right hip: Secondary | ICD-10-CM | POA: Diagnosis not present

## 2023-10-19 DIAGNOSIS — M25511 Pain in right shoulder: Secondary | ICD-10-CM | POA: Diagnosis not present

## 2023-10-19 DIAGNOSIS — M6281 Muscle weakness (generalized): Secondary | ICD-10-CM | POA: Diagnosis not present

## 2023-10-19 DIAGNOSIS — M5459 Other low back pain: Secondary | ICD-10-CM | POA: Diagnosis not present

## 2023-10-19 DIAGNOSIS — Z471 Aftercare following joint replacement surgery: Secondary | ICD-10-CM | POA: Diagnosis not present

## 2023-10-27 DIAGNOSIS — Z471 Aftercare following joint replacement surgery: Secondary | ICD-10-CM | POA: Diagnosis not present

## 2023-10-27 DIAGNOSIS — M25552 Pain in left hip: Secondary | ICD-10-CM | POA: Diagnosis not present

## 2023-10-27 DIAGNOSIS — M5459 Other low back pain: Secondary | ICD-10-CM | POA: Diagnosis not present

## 2023-10-27 DIAGNOSIS — M25551 Pain in right hip: Secondary | ICD-10-CM | POA: Diagnosis not present

## 2023-10-27 DIAGNOSIS — M6281 Muscle weakness (generalized): Secondary | ICD-10-CM | POA: Diagnosis not present

## 2023-10-27 DIAGNOSIS — M25511 Pain in right shoulder: Secondary | ICD-10-CM | POA: Diagnosis not present

## 2023-11-03 DIAGNOSIS — M25551 Pain in right hip: Secondary | ICD-10-CM | POA: Diagnosis not present

## 2023-11-03 DIAGNOSIS — M25511 Pain in right shoulder: Secondary | ICD-10-CM | POA: Diagnosis not present

## 2023-11-03 DIAGNOSIS — M25552 Pain in left hip: Secondary | ICD-10-CM | POA: Diagnosis not present

## 2023-11-03 DIAGNOSIS — Z471 Aftercare following joint replacement surgery: Secondary | ICD-10-CM | POA: Diagnosis not present

## 2023-11-03 DIAGNOSIS — M6281 Muscle weakness (generalized): Secondary | ICD-10-CM | POA: Diagnosis not present

## 2023-11-03 DIAGNOSIS — M5459 Other low back pain: Secondary | ICD-10-CM | POA: Diagnosis not present

## 2023-11-05 DIAGNOSIS — M1612 Unilateral primary osteoarthritis, left hip: Secondary | ICD-10-CM | POA: Diagnosis not present

## 2023-11-06 DIAGNOSIS — M5459 Other low back pain: Secondary | ICD-10-CM | POA: Diagnosis not present

## 2023-11-06 DIAGNOSIS — M25552 Pain in left hip: Secondary | ICD-10-CM | POA: Diagnosis not present

## 2023-11-06 DIAGNOSIS — M25511 Pain in right shoulder: Secondary | ICD-10-CM | POA: Diagnosis not present

## 2023-11-06 DIAGNOSIS — N1831 Chronic kidney disease, stage 3a: Secondary | ICD-10-CM | POA: Diagnosis not present

## 2023-11-06 DIAGNOSIS — Z471 Aftercare following joint replacement surgery: Secondary | ICD-10-CM | POA: Diagnosis not present

## 2023-11-06 DIAGNOSIS — M25551 Pain in right hip: Secondary | ICD-10-CM | POA: Diagnosis not present

## 2023-11-06 DIAGNOSIS — M6281 Muscle weakness (generalized): Secondary | ICD-10-CM | POA: Diagnosis not present

## 2023-11-09 ENCOUNTER — Telehealth (INDEPENDENT_AMBULATORY_CARE_PROVIDER_SITE_OTHER): Payer: Self-pay | Admitting: Otolaryngology

## 2023-11-09 NOTE — Telephone Encounter (Signed)
LVM to confirm appt & location [phone] afm

## 2023-11-10 ENCOUNTER — Encounter (INDEPENDENT_AMBULATORY_CARE_PROVIDER_SITE_OTHER): Payer: Self-pay

## 2023-11-10 ENCOUNTER — Ambulatory Visit (INDEPENDENT_AMBULATORY_CARE_PROVIDER_SITE_OTHER): Payer: Medicare Other | Admitting: Otolaryngology

## 2023-11-10 VITALS — BP 126/84 | HR 102 | Ht 66.5 in | Wt 165.0 lb

## 2023-11-10 DIAGNOSIS — H903 Sensorineural hearing loss, bilateral: Secondary | ICD-10-CM | POA: Diagnosis not present

## 2023-11-10 DIAGNOSIS — M6281 Muscle weakness (generalized): Secondary | ICD-10-CM | POA: Diagnosis not present

## 2023-11-10 DIAGNOSIS — M25551 Pain in right hip: Secondary | ICD-10-CM | POA: Diagnosis not present

## 2023-11-10 DIAGNOSIS — M25552 Pain in left hip: Secondary | ICD-10-CM | POA: Diagnosis not present

## 2023-11-10 DIAGNOSIS — M5459 Other low back pain: Secondary | ICD-10-CM | POA: Diagnosis not present

## 2023-11-10 DIAGNOSIS — Z471 Aftercare following joint replacement surgery: Secondary | ICD-10-CM | POA: Diagnosis not present

## 2023-11-10 DIAGNOSIS — M25511 Pain in right shoulder: Secondary | ICD-10-CM | POA: Diagnosis not present

## 2023-11-11 DIAGNOSIS — N182 Chronic kidney disease, stage 2 (mild): Secondary | ICD-10-CM | POA: Diagnosis not present

## 2023-11-11 DIAGNOSIS — R801 Persistent proteinuria, unspecified: Secondary | ICD-10-CM | POA: Diagnosis not present

## 2023-11-11 DIAGNOSIS — H903 Sensorineural hearing loss, bilateral: Secondary | ICD-10-CM | POA: Insufficient documentation

## 2023-11-11 DIAGNOSIS — E1122 Type 2 diabetes mellitus with diabetic chronic kidney disease: Secondary | ICD-10-CM | POA: Diagnosis not present

## 2023-11-11 NOTE — Progress Notes (Signed)
Patient ID: Regina Kim, female   DOB: Mar 09, 1951, 73 y.o.   MRN: [id number]  Follow-up: Hearing loss  HPI: The patient is a 73 year old female who returns today for follow-up evaluation of her hearing loss.  She was last seen 1 year ago.  At that time, she was noted to have bilateral high-frequency mild to moderate sensorineural hearing loss.  She was fitted with bilateral hearing aids.  However, she did not like the hearing aids, and decided to return the hearing aids.  She returns today complaining of slight decrease in her hearing.  She denies any otalgia, otorrhea, or vertigo.  She has no other otologic issues.  Exam: General: Communicates without difficulty, well nourished, no acute distress. Head: Normocephalic, no evidence injury, no tenderness, facial buttresses intact without stepoff. Face/sinus: No tenderness to palpation and percussion. Facial movement is normal and symmetric. Eyes: PERRL, EOMI. No scleral icterus, conjunctivae clear. Neuro: CN II exam reveals vision grossly intact.  No nystagmus at any point of gaze. Ears: Auricles well formed without lesions.  Ear canals are intact without mass or lesion.  No erythema or edema is appreciated.  The TMs are intact without fluid. Nose: External evaluation reveals normal support and skin without lesions.  Dorsum is intact.  Anterior rhinoscopy reveals normal mucosa over anterior aspect of inferior turbinates and intact septum.  No purulence noted. Oral:  Oral cavity and oropharynx are intact, symmetric, without erythema or edema.  Mucosa is moist without lesions. Neck: Full range of motion without pain.  There is no significant lymphadenopathy.  No masses palpable.  Thyroid bed within normal limits to palpation.  Parotid glands and submandibular glands equal bilaterally without mass.  Trachea is midline. Neuro:  CN 2-12 grossly intact.   Assessment: 1.  Bilateral symmetric high-frequency sensorineural hearing loss. 2.  Her ear canals,  tympanic membranes, and middle ear spaces are all normal.  Plan: 1.  The physical exam findings are reviewed with the patient. 2.  Repeat hearing test with our audiologist as an outpatient.  No audiologist is available today. 3.  The patient may be a candidate to use the Apple Airpod 2 to treat her mild to moderate sensorineural hearing loss. 4.  The patient will return for reevaluation in 1 year, sooner if needed.

## 2023-11-12 DIAGNOSIS — M25511 Pain in right shoulder: Secondary | ICD-10-CM | POA: Diagnosis not present

## 2023-11-12 DIAGNOSIS — M5459 Other low back pain: Secondary | ICD-10-CM | POA: Diagnosis not present

## 2023-11-12 DIAGNOSIS — Z471 Aftercare following joint replacement surgery: Secondary | ICD-10-CM | POA: Diagnosis not present

## 2023-11-12 DIAGNOSIS — M25551 Pain in right hip: Secondary | ICD-10-CM | POA: Diagnosis not present

## 2023-11-12 DIAGNOSIS — M25552 Pain in left hip: Secondary | ICD-10-CM | POA: Diagnosis not present

## 2023-11-12 DIAGNOSIS — M6281 Muscle weakness (generalized): Secondary | ICD-10-CM | POA: Diagnosis not present

## 2023-11-16 DIAGNOSIS — M25552 Pain in left hip: Secondary | ICD-10-CM | POA: Diagnosis not present

## 2023-11-16 DIAGNOSIS — Z471 Aftercare following joint replacement surgery: Secondary | ICD-10-CM | POA: Diagnosis not present

## 2023-11-16 DIAGNOSIS — M25511 Pain in right shoulder: Secondary | ICD-10-CM | POA: Diagnosis not present

## 2023-11-16 DIAGNOSIS — M6281 Muscle weakness (generalized): Secondary | ICD-10-CM | POA: Diagnosis not present

## 2023-11-16 DIAGNOSIS — M5459 Other low back pain: Secondary | ICD-10-CM | POA: Diagnosis not present

## 2023-11-16 DIAGNOSIS — M25551 Pain in right hip: Secondary | ICD-10-CM | POA: Diagnosis not present

## 2023-11-19 DIAGNOSIS — M25511 Pain in right shoulder: Secondary | ICD-10-CM | POA: Diagnosis not present

## 2023-11-19 DIAGNOSIS — Z471 Aftercare following joint replacement surgery: Secondary | ICD-10-CM | POA: Diagnosis not present

## 2023-11-19 DIAGNOSIS — M5459 Other low back pain: Secondary | ICD-10-CM | POA: Diagnosis not present

## 2023-11-19 DIAGNOSIS — M6281 Muscle weakness (generalized): Secondary | ICD-10-CM | POA: Diagnosis not present

## 2023-11-19 DIAGNOSIS — M25551 Pain in right hip: Secondary | ICD-10-CM | POA: Diagnosis not present

## 2023-11-19 DIAGNOSIS — M25559 Pain in unspecified hip: Secondary | ICD-10-CM | POA: Diagnosis not present

## 2023-11-23 DIAGNOSIS — M5459 Other low back pain: Secondary | ICD-10-CM | POA: Diagnosis not present

## 2023-11-23 DIAGNOSIS — M25559 Pain in unspecified hip: Secondary | ICD-10-CM | POA: Diagnosis not present

## 2023-11-23 DIAGNOSIS — M25511 Pain in right shoulder: Secondary | ICD-10-CM | POA: Diagnosis not present

## 2023-11-23 DIAGNOSIS — Z471 Aftercare following joint replacement surgery: Secondary | ICD-10-CM | POA: Diagnosis not present

## 2023-11-23 DIAGNOSIS — M25551 Pain in right hip: Secondary | ICD-10-CM | POA: Diagnosis not present

## 2023-11-23 DIAGNOSIS — M6281 Muscle weakness (generalized): Secondary | ICD-10-CM | POA: Diagnosis not present

## 2023-11-24 DIAGNOSIS — M1612 Unilateral primary osteoarthritis, left hip: Secondary | ICD-10-CM | POA: Diagnosis not present

## 2023-11-25 DIAGNOSIS — M25511 Pain in right shoulder: Secondary | ICD-10-CM | POA: Diagnosis not present

## 2023-11-25 DIAGNOSIS — M5459 Other low back pain: Secondary | ICD-10-CM | POA: Diagnosis not present

## 2023-11-25 DIAGNOSIS — M25551 Pain in right hip: Secondary | ICD-10-CM | POA: Diagnosis not present

## 2023-11-25 DIAGNOSIS — Z471 Aftercare following joint replacement surgery: Secondary | ICD-10-CM | POA: Diagnosis not present

## 2023-11-25 DIAGNOSIS — M6281 Muscle weakness (generalized): Secondary | ICD-10-CM | POA: Diagnosis not present

## 2023-11-25 DIAGNOSIS — M25552 Pain in left hip: Secondary | ICD-10-CM | POA: Diagnosis not present

## 2023-11-30 DIAGNOSIS — Z471 Aftercare following joint replacement surgery: Secondary | ICD-10-CM | POA: Diagnosis not present

## 2023-11-30 DIAGNOSIS — M6281 Muscle weakness (generalized): Secondary | ICD-10-CM | POA: Diagnosis not present

## 2023-11-30 DIAGNOSIS — M25511 Pain in right shoulder: Secondary | ICD-10-CM | POA: Diagnosis not present

## 2023-11-30 DIAGNOSIS — M25552 Pain in left hip: Secondary | ICD-10-CM | POA: Diagnosis not present

## 2023-11-30 DIAGNOSIS — M5459 Other low back pain: Secondary | ICD-10-CM | POA: Diagnosis not present

## 2023-11-30 DIAGNOSIS — M25551 Pain in right hip: Secondary | ICD-10-CM | POA: Diagnosis not present

## 2023-12-02 DIAGNOSIS — M25551 Pain in right hip: Secondary | ICD-10-CM | POA: Diagnosis not present

## 2023-12-02 DIAGNOSIS — M25511 Pain in right shoulder: Secondary | ICD-10-CM | POA: Diagnosis not present

## 2023-12-02 DIAGNOSIS — Z471 Aftercare following joint replacement surgery: Secondary | ICD-10-CM | POA: Diagnosis not present

## 2023-12-02 DIAGNOSIS — M6281 Muscle weakness (generalized): Secondary | ICD-10-CM | POA: Diagnosis not present

## 2023-12-02 DIAGNOSIS — M5459 Other low back pain: Secondary | ICD-10-CM | POA: Diagnosis not present

## 2023-12-02 DIAGNOSIS — M25552 Pain in left hip: Secondary | ICD-10-CM | POA: Diagnosis not present

## 2023-12-07 DIAGNOSIS — M25551 Pain in right hip: Secondary | ICD-10-CM | POA: Diagnosis not present

## 2023-12-07 DIAGNOSIS — Z471 Aftercare following joint replacement surgery: Secondary | ICD-10-CM | POA: Diagnosis not present

## 2023-12-07 DIAGNOSIS — M25552 Pain in left hip: Secondary | ICD-10-CM | POA: Diagnosis not present

## 2023-12-07 DIAGNOSIS — M25511 Pain in right shoulder: Secondary | ICD-10-CM | POA: Diagnosis not present

## 2023-12-07 DIAGNOSIS — M6281 Muscle weakness (generalized): Secondary | ICD-10-CM | POA: Diagnosis not present

## 2023-12-07 DIAGNOSIS — M5459 Other low back pain: Secondary | ICD-10-CM | POA: Diagnosis not present

## 2023-12-14 DIAGNOSIS — M25552 Pain in left hip: Secondary | ICD-10-CM | POA: Diagnosis not present

## 2023-12-14 DIAGNOSIS — Z471 Aftercare following joint replacement surgery: Secondary | ICD-10-CM | POA: Diagnosis not present

## 2023-12-14 DIAGNOSIS — M25551 Pain in right hip: Secondary | ICD-10-CM | POA: Diagnosis not present

## 2023-12-14 DIAGNOSIS — M6281 Muscle weakness (generalized): Secondary | ICD-10-CM | POA: Diagnosis not present

## 2023-12-14 DIAGNOSIS — M25511 Pain in right shoulder: Secondary | ICD-10-CM | POA: Diagnosis not present

## 2023-12-14 DIAGNOSIS — M5459 Other low back pain: Secondary | ICD-10-CM | POA: Diagnosis not present

## 2023-12-16 DIAGNOSIS — M25552 Pain in left hip: Secondary | ICD-10-CM | POA: Diagnosis not present

## 2023-12-16 DIAGNOSIS — M5459 Other low back pain: Secondary | ICD-10-CM | POA: Diagnosis not present

## 2023-12-16 DIAGNOSIS — M25511 Pain in right shoulder: Secondary | ICD-10-CM | POA: Diagnosis not present

## 2023-12-16 DIAGNOSIS — M25551 Pain in right hip: Secondary | ICD-10-CM | POA: Diagnosis not present

## 2023-12-16 DIAGNOSIS — M6281 Muscle weakness (generalized): Secondary | ICD-10-CM | POA: Diagnosis not present

## 2023-12-16 DIAGNOSIS — Z471 Aftercare following joint replacement surgery: Secondary | ICD-10-CM | POA: Diagnosis not present

## 2023-12-21 DIAGNOSIS — M25552 Pain in left hip: Secondary | ICD-10-CM | POA: Diagnosis not present

## 2023-12-21 DIAGNOSIS — M25551 Pain in right hip: Secondary | ICD-10-CM | POA: Diagnosis not present

## 2023-12-21 DIAGNOSIS — Z471 Aftercare following joint replacement surgery: Secondary | ICD-10-CM | POA: Diagnosis not present

## 2023-12-21 DIAGNOSIS — M5459 Other low back pain: Secondary | ICD-10-CM | POA: Diagnosis not present

## 2023-12-21 DIAGNOSIS — M6281 Muscle weakness (generalized): Secondary | ICD-10-CM | POA: Diagnosis not present

## 2023-12-21 DIAGNOSIS — M25511 Pain in right shoulder: Secondary | ICD-10-CM | POA: Diagnosis not present

## 2023-12-28 DIAGNOSIS — M25551 Pain in right hip: Secondary | ICD-10-CM | POA: Diagnosis not present

## 2023-12-28 DIAGNOSIS — M5459 Other low back pain: Secondary | ICD-10-CM | POA: Diagnosis not present

## 2023-12-28 DIAGNOSIS — Z471 Aftercare following joint replacement surgery: Secondary | ICD-10-CM | POA: Diagnosis not present

## 2023-12-28 DIAGNOSIS — M25511 Pain in right shoulder: Secondary | ICD-10-CM | POA: Diagnosis not present

## 2023-12-28 DIAGNOSIS — M25552 Pain in left hip: Secondary | ICD-10-CM | POA: Diagnosis not present

## 2023-12-28 DIAGNOSIS — M6281 Muscle weakness (generalized): Secondary | ICD-10-CM | POA: Diagnosis not present

## 2023-12-30 DIAGNOSIS — Z471 Aftercare following joint replacement surgery: Secondary | ICD-10-CM | POA: Diagnosis not present

## 2023-12-30 DIAGNOSIS — M5459 Other low back pain: Secondary | ICD-10-CM | POA: Diagnosis not present

## 2023-12-30 DIAGNOSIS — M25511 Pain in right shoulder: Secondary | ICD-10-CM | POA: Diagnosis not present

## 2023-12-30 DIAGNOSIS — M25552 Pain in left hip: Secondary | ICD-10-CM | POA: Diagnosis not present

## 2023-12-30 DIAGNOSIS — M6281 Muscle weakness (generalized): Secondary | ICD-10-CM | POA: Diagnosis not present

## 2023-12-30 DIAGNOSIS — M25551 Pain in right hip: Secondary | ICD-10-CM | POA: Diagnosis not present

## 2024-01-04 DIAGNOSIS — M6281 Muscle weakness (generalized): Secondary | ICD-10-CM | POA: Diagnosis not present

## 2024-01-04 DIAGNOSIS — M5459 Other low back pain: Secondary | ICD-10-CM | POA: Diagnosis not present

## 2024-01-04 DIAGNOSIS — M25551 Pain in right hip: Secondary | ICD-10-CM | POA: Diagnosis not present

## 2024-01-04 DIAGNOSIS — Z471 Aftercare following joint replacement surgery: Secondary | ICD-10-CM | POA: Diagnosis not present

## 2024-01-04 DIAGNOSIS — M25552 Pain in left hip: Secondary | ICD-10-CM | POA: Diagnosis not present

## 2024-01-04 DIAGNOSIS — M25511 Pain in right shoulder: Secondary | ICD-10-CM | POA: Diagnosis not present

## 2024-01-05 DIAGNOSIS — M25552 Pain in left hip: Secondary | ICD-10-CM | POA: Diagnosis not present

## 2024-01-06 DIAGNOSIS — M75121 Complete rotator cuff tear or rupture of right shoulder, not specified as traumatic: Secondary | ICD-10-CM | POA: Diagnosis not present

## 2024-01-07 DIAGNOSIS — Z471 Aftercare following joint replacement surgery: Secondary | ICD-10-CM | POA: Diagnosis not present

## 2024-01-07 DIAGNOSIS — E782 Mixed hyperlipidemia: Secondary | ICD-10-CM | POA: Diagnosis not present

## 2024-01-07 DIAGNOSIS — M25552 Pain in left hip: Secondary | ICD-10-CM | POA: Diagnosis not present

## 2024-01-07 DIAGNOSIS — M25551 Pain in right hip: Secondary | ICD-10-CM | POA: Diagnosis not present

## 2024-01-07 DIAGNOSIS — M6281 Muscle weakness (generalized): Secondary | ICD-10-CM | POA: Diagnosis not present

## 2024-01-07 DIAGNOSIS — E1121 Type 2 diabetes mellitus with diabetic nephropathy: Secondary | ICD-10-CM | POA: Diagnosis not present

## 2024-01-07 DIAGNOSIS — M25511 Pain in right shoulder: Secondary | ICD-10-CM | POA: Diagnosis not present

## 2024-01-07 DIAGNOSIS — M5459 Other low back pain: Secondary | ICD-10-CM | POA: Diagnosis not present

## 2024-01-11 DIAGNOSIS — M5459 Other low back pain: Secondary | ICD-10-CM | POA: Diagnosis not present

## 2024-01-11 DIAGNOSIS — Z01810 Encounter for preprocedural cardiovascular examination: Secondary | ICD-10-CM | POA: Diagnosis not present

## 2024-01-11 DIAGNOSIS — E785 Hyperlipidemia, unspecified: Secondary | ICD-10-CM | POA: Diagnosis not present

## 2024-01-11 DIAGNOSIS — M25552 Pain in left hip: Secondary | ICD-10-CM | POA: Diagnosis not present

## 2024-01-11 DIAGNOSIS — I10 Essential (primary) hypertension: Secondary | ICD-10-CM | POA: Diagnosis not present

## 2024-01-11 DIAGNOSIS — E1121 Type 2 diabetes mellitus with diabetic nephropathy: Secondary | ICD-10-CM | POA: Diagnosis not present

## 2024-01-11 DIAGNOSIS — M8589 Other specified disorders of bone density and structure, multiple sites: Secondary | ICD-10-CM | POA: Diagnosis not present

## 2024-01-11 DIAGNOSIS — Z471 Aftercare following joint replacement surgery: Secondary | ICD-10-CM | POA: Diagnosis not present

## 2024-01-11 DIAGNOSIS — M25511 Pain in right shoulder: Secondary | ICD-10-CM | POA: Diagnosis not present

## 2024-01-11 DIAGNOSIS — M6281 Muscle weakness (generalized): Secondary | ICD-10-CM | POA: Diagnosis not present

## 2024-01-11 DIAGNOSIS — M25551 Pain in right hip: Secondary | ICD-10-CM | POA: Diagnosis not present

## 2024-01-13 DIAGNOSIS — M5459 Other low back pain: Secondary | ICD-10-CM | POA: Diagnosis not present

## 2024-01-13 DIAGNOSIS — M25511 Pain in right shoulder: Secondary | ICD-10-CM | POA: Diagnosis not present

## 2024-01-13 DIAGNOSIS — Z471 Aftercare following joint replacement surgery: Secondary | ICD-10-CM | POA: Diagnosis not present

## 2024-01-13 DIAGNOSIS — M25551 Pain in right hip: Secondary | ICD-10-CM | POA: Diagnosis not present

## 2024-01-13 DIAGNOSIS — M25552 Pain in left hip: Secondary | ICD-10-CM | POA: Diagnosis not present

## 2024-01-13 DIAGNOSIS — M6281 Muscle weakness (generalized): Secondary | ICD-10-CM | POA: Diagnosis not present

## 2024-01-18 DIAGNOSIS — M25552 Pain in left hip: Secondary | ICD-10-CM | POA: Diagnosis not present

## 2024-01-18 DIAGNOSIS — M5459 Other low back pain: Secondary | ICD-10-CM | POA: Diagnosis not present

## 2024-01-18 DIAGNOSIS — M25551 Pain in right hip: Secondary | ICD-10-CM | POA: Diagnosis not present

## 2024-01-18 DIAGNOSIS — Z471 Aftercare following joint replacement surgery: Secondary | ICD-10-CM | POA: Diagnosis not present

## 2024-01-18 DIAGNOSIS — M6281 Muscle weakness (generalized): Secondary | ICD-10-CM | POA: Diagnosis not present

## 2024-01-18 DIAGNOSIS — M25511 Pain in right shoulder: Secondary | ICD-10-CM | POA: Diagnosis not present

## 2024-01-20 DIAGNOSIS — M25551 Pain in right hip: Secondary | ICD-10-CM | POA: Diagnosis not present

## 2024-01-20 DIAGNOSIS — M25511 Pain in right shoulder: Secondary | ICD-10-CM | POA: Diagnosis not present

## 2024-01-20 DIAGNOSIS — M25552 Pain in left hip: Secondary | ICD-10-CM | POA: Diagnosis not present

## 2024-01-20 DIAGNOSIS — M6281 Muscle weakness (generalized): Secondary | ICD-10-CM | POA: Diagnosis not present

## 2024-01-20 DIAGNOSIS — M5459 Other low back pain: Secondary | ICD-10-CM | POA: Diagnosis not present

## 2024-01-20 DIAGNOSIS — Z471 Aftercare following joint replacement surgery: Secondary | ICD-10-CM | POA: Diagnosis not present

## 2024-02-01 DIAGNOSIS — M25511 Pain in right shoulder: Secondary | ICD-10-CM | POA: Diagnosis not present

## 2024-02-01 DIAGNOSIS — M25551 Pain in right hip: Secondary | ICD-10-CM | POA: Diagnosis not present

## 2024-02-01 DIAGNOSIS — M5459 Other low back pain: Secondary | ICD-10-CM | POA: Diagnosis not present

## 2024-02-01 DIAGNOSIS — Z471 Aftercare following joint replacement surgery: Secondary | ICD-10-CM | POA: Diagnosis not present

## 2024-02-01 DIAGNOSIS — M25552 Pain in left hip: Secondary | ICD-10-CM | POA: Diagnosis not present

## 2024-02-01 DIAGNOSIS — M6281 Muscle weakness (generalized): Secondary | ICD-10-CM | POA: Diagnosis not present

## 2024-02-03 DIAGNOSIS — M6281 Muscle weakness (generalized): Secondary | ICD-10-CM | POA: Diagnosis not present

## 2024-02-03 DIAGNOSIS — M5459 Other low back pain: Secondary | ICD-10-CM | POA: Diagnosis not present

## 2024-02-03 DIAGNOSIS — M25552 Pain in left hip: Secondary | ICD-10-CM | POA: Diagnosis not present

## 2024-02-03 DIAGNOSIS — M25511 Pain in right shoulder: Secondary | ICD-10-CM | POA: Diagnosis not present

## 2024-02-03 DIAGNOSIS — Z471 Aftercare following joint replacement surgery: Secondary | ICD-10-CM | POA: Diagnosis not present

## 2024-02-03 DIAGNOSIS — M25551 Pain in right hip: Secondary | ICD-10-CM | POA: Diagnosis not present

## 2024-02-08 DIAGNOSIS — Z471 Aftercare following joint replacement surgery: Secondary | ICD-10-CM | POA: Diagnosis not present

## 2024-02-08 DIAGNOSIS — M5459 Other low back pain: Secondary | ICD-10-CM | POA: Diagnosis not present

## 2024-02-08 DIAGNOSIS — M6281 Muscle weakness (generalized): Secondary | ICD-10-CM | POA: Diagnosis not present

## 2024-02-08 DIAGNOSIS — M25511 Pain in right shoulder: Secondary | ICD-10-CM | POA: Diagnosis not present

## 2024-02-08 DIAGNOSIS — M25551 Pain in right hip: Secondary | ICD-10-CM | POA: Diagnosis not present

## 2024-02-08 DIAGNOSIS — M25552 Pain in left hip: Secondary | ICD-10-CM | POA: Diagnosis not present

## 2024-02-15 DIAGNOSIS — M25552 Pain in left hip: Secondary | ICD-10-CM | POA: Diagnosis not present

## 2024-02-15 DIAGNOSIS — M25551 Pain in right hip: Secondary | ICD-10-CM | POA: Diagnosis not present

## 2024-02-15 DIAGNOSIS — Z471 Aftercare following joint replacement surgery: Secondary | ICD-10-CM | POA: Diagnosis not present

## 2024-02-15 DIAGNOSIS — M25511 Pain in right shoulder: Secondary | ICD-10-CM | POA: Diagnosis not present

## 2024-02-15 DIAGNOSIS — M5459 Other low back pain: Secondary | ICD-10-CM | POA: Diagnosis not present

## 2024-02-15 DIAGNOSIS — M6281 Muscle weakness (generalized): Secondary | ICD-10-CM | POA: Diagnosis not present

## 2024-02-16 DIAGNOSIS — M25552 Pain in left hip: Secondary | ICD-10-CM | POA: Diagnosis not present

## 2024-02-17 DIAGNOSIS — Z471 Aftercare following joint replacement surgery: Secondary | ICD-10-CM | POA: Diagnosis not present

## 2024-02-17 DIAGNOSIS — M25511 Pain in right shoulder: Secondary | ICD-10-CM | POA: Diagnosis not present

## 2024-02-17 DIAGNOSIS — M6281 Muscle weakness (generalized): Secondary | ICD-10-CM | POA: Diagnosis not present

## 2024-02-17 DIAGNOSIS — M5459 Other low back pain: Secondary | ICD-10-CM | POA: Diagnosis not present

## 2024-02-17 DIAGNOSIS — M25552 Pain in left hip: Secondary | ICD-10-CM | POA: Diagnosis not present

## 2024-02-17 DIAGNOSIS — M25551 Pain in right hip: Secondary | ICD-10-CM | POA: Diagnosis not present

## 2024-02-23 DIAGNOSIS — M25552 Pain in left hip: Secondary | ICD-10-CM | POA: Diagnosis not present

## 2024-02-23 DIAGNOSIS — M25551 Pain in right hip: Secondary | ICD-10-CM | POA: Diagnosis not present

## 2024-02-23 DIAGNOSIS — M6281 Muscle weakness (generalized): Secondary | ICD-10-CM | POA: Diagnosis not present

## 2024-02-23 DIAGNOSIS — M5459 Other low back pain: Secondary | ICD-10-CM | POA: Diagnosis not present

## 2024-02-23 DIAGNOSIS — Z471 Aftercare following joint replacement surgery: Secondary | ICD-10-CM | POA: Diagnosis not present

## 2024-02-23 DIAGNOSIS — M25511 Pain in right shoulder: Secondary | ICD-10-CM | POA: Diagnosis not present

## 2024-02-26 DIAGNOSIS — M75121 Complete rotator cuff tear or rupture of right shoulder, not specified as traumatic: Secondary | ICD-10-CM | POA: Diagnosis not present

## 2024-02-29 DIAGNOSIS — M6281 Muscle weakness (generalized): Secondary | ICD-10-CM | POA: Diagnosis not present

## 2024-02-29 DIAGNOSIS — M25551 Pain in right hip: Secondary | ICD-10-CM | POA: Diagnosis not present

## 2024-02-29 DIAGNOSIS — M25511 Pain in right shoulder: Secondary | ICD-10-CM | POA: Diagnosis not present

## 2024-02-29 DIAGNOSIS — M5459 Other low back pain: Secondary | ICD-10-CM | POA: Diagnosis not present

## 2024-02-29 DIAGNOSIS — Z471 Aftercare following joint replacement surgery: Secondary | ICD-10-CM | POA: Diagnosis not present

## 2024-02-29 DIAGNOSIS — M25552 Pain in left hip: Secondary | ICD-10-CM | POA: Diagnosis not present

## 2024-03-07 DIAGNOSIS — M6281 Muscle weakness (generalized): Secondary | ICD-10-CM | POA: Diagnosis not present

## 2024-03-07 DIAGNOSIS — M25551 Pain in right hip: Secondary | ICD-10-CM | POA: Diagnosis not present

## 2024-03-07 DIAGNOSIS — M25511 Pain in right shoulder: Secondary | ICD-10-CM | POA: Diagnosis not present

## 2024-03-07 DIAGNOSIS — M25552 Pain in left hip: Secondary | ICD-10-CM | POA: Diagnosis not present

## 2024-03-07 DIAGNOSIS — M5459 Other low back pain: Secondary | ICD-10-CM | POA: Diagnosis not present

## 2024-03-07 DIAGNOSIS — Z471 Aftercare following joint replacement surgery: Secondary | ICD-10-CM | POA: Diagnosis not present

## 2024-03-10 ENCOUNTER — Encounter: Payer: Self-pay | Admitting: Podiatry

## 2024-03-10 ENCOUNTER — Ambulatory Visit (INDEPENDENT_AMBULATORY_CARE_PROVIDER_SITE_OTHER): Admitting: Podiatry

## 2024-03-10 VITALS — Ht 66.5 in | Wt 165.0 lb

## 2024-03-10 DIAGNOSIS — B070 Plantar wart: Secondary | ICD-10-CM | POA: Diagnosis not present

## 2024-03-11 NOTE — Progress Notes (Signed)
 Subjective:   Patient ID: Regina Kim, female   DOB: 73 y.o.   MRN: [id number]   HPI Patient presents with a short-term history of a painful lesion plantar right that is hard to walk on and states she has had history of wart on the left foot.  Patient states it has been getting gradually worse patient does not smoke likes to be active   Review of Systems  All other systems reviewed and are negative.       Objective:  Physical Exam Vitals and nursing note reviewed.  Constitutional:      Appearance: She is well-developed.  Pulmonary:     Effort: Pulmonary effort is normal.  Musculoskeletal:        General: Normal range of motion.  Skin:    General: Skin is warm.  Neurological:     Mental Status: She is alert.     Neurovascular status intact muscle strength adequate range of motion adequate with exquisite discomfort noted plantar aspect right second metatarsal measuring about 5 x 5 mm that upon debridement shows pinpoint bleeding.  Patient has good digital perfusion well-oriented x 3     Assessment:  For verruca plantaris plantar right cannot rule out other types of soft tissue lesion or foreign body     Plan:  H&P reviewed and I went ahead using sterile sharp instrumentation debrided the lesion entirely exposed the base and applied chemical agent to create immune response with sterile dressing instructed what to do if blistering were to occur reappoint to recheck as needed

## 2024-03-15 DIAGNOSIS — R058 Other specified cough: Secondary | ICD-10-CM | POA: Diagnosis not present

## 2024-03-15 DIAGNOSIS — J0100 Acute maxillary sinusitis, unspecified: Secondary | ICD-10-CM | POA: Diagnosis not present

## 2024-03-17 DIAGNOSIS — M25552 Pain in left hip: Secondary | ICD-10-CM | POA: Diagnosis not present

## 2024-03-17 DIAGNOSIS — M25551 Pain in right hip: Secondary | ICD-10-CM | POA: Diagnosis not present

## 2024-03-17 DIAGNOSIS — M25511 Pain in right shoulder: Secondary | ICD-10-CM | POA: Diagnosis not present

## 2024-03-17 DIAGNOSIS — M6281 Muscle weakness (generalized): Secondary | ICD-10-CM | POA: Diagnosis not present

## 2024-03-17 DIAGNOSIS — Z471 Aftercare following joint replacement surgery: Secondary | ICD-10-CM | POA: Diagnosis not present

## 2024-03-17 DIAGNOSIS — M5459 Other low back pain: Secondary | ICD-10-CM | POA: Diagnosis not present

## 2024-03-22 DIAGNOSIS — M25511 Pain in right shoulder: Secondary | ICD-10-CM | POA: Diagnosis not present

## 2024-03-22 DIAGNOSIS — M25551 Pain in right hip: Secondary | ICD-10-CM | POA: Diagnosis not present

## 2024-03-22 DIAGNOSIS — M5459 Other low back pain: Secondary | ICD-10-CM | POA: Diagnosis not present

## 2024-03-22 DIAGNOSIS — M25552 Pain in left hip: Secondary | ICD-10-CM | POA: Diagnosis not present

## 2024-03-22 DIAGNOSIS — M6281 Muscle weakness (generalized): Secondary | ICD-10-CM | POA: Diagnosis not present

## 2024-03-22 DIAGNOSIS — E1121 Type 2 diabetes mellitus with diabetic nephropathy: Secondary | ICD-10-CM | POA: Diagnosis not present

## 2024-03-22 DIAGNOSIS — Z471 Aftercare following joint replacement surgery: Secondary | ICD-10-CM | POA: Diagnosis not present

## 2024-03-29 DIAGNOSIS — M25552 Pain in left hip: Secondary | ICD-10-CM | POA: Diagnosis not present

## 2024-03-29 DIAGNOSIS — M25551 Pain in right hip: Secondary | ICD-10-CM | POA: Diagnosis not present

## 2024-03-29 DIAGNOSIS — I129 Hypertensive chronic kidney disease with stage 1 through stage 4 chronic kidney disease, or unspecified chronic kidney disease: Secondary | ICD-10-CM | POA: Diagnosis not present

## 2024-03-29 DIAGNOSIS — E7800 Pure hypercholesterolemia, unspecified: Secondary | ICD-10-CM | POA: Diagnosis not present

## 2024-03-29 DIAGNOSIS — E119 Type 2 diabetes mellitus without complications: Secondary | ICD-10-CM | POA: Diagnosis not present

## 2024-03-29 DIAGNOSIS — N182 Chronic kidney disease, stage 2 (mild): Secondary | ICD-10-CM | POA: Diagnosis not present

## 2024-03-29 DIAGNOSIS — Z6827 Body mass index (BMI) 27.0-27.9, adult: Secondary | ICD-10-CM | POA: Diagnosis not present

## 2024-03-29 DIAGNOSIS — Z471 Aftercare following joint replacement surgery: Secondary | ICD-10-CM | POA: Diagnosis not present

## 2024-03-29 DIAGNOSIS — M6281 Muscle weakness (generalized): Secondary | ICD-10-CM | POA: Diagnosis not present

## 2024-03-29 DIAGNOSIS — M5459 Other low back pain: Secondary | ICD-10-CM | POA: Diagnosis not present

## 2024-03-29 DIAGNOSIS — M25511 Pain in right shoulder: Secondary | ICD-10-CM | POA: Diagnosis not present

## 2024-03-30 DIAGNOSIS — H353132 Nonexudative age-related macular degeneration, bilateral, intermediate dry stage: Secondary | ICD-10-CM | POA: Diagnosis not present

## 2024-03-30 DIAGNOSIS — H5213 Myopia, bilateral: Secondary | ICD-10-CM | POA: Diagnosis not present

## 2024-03-30 DIAGNOSIS — H2512 Age-related nuclear cataract, left eye: Secondary | ICD-10-CM | POA: Diagnosis not present

## 2024-03-30 DIAGNOSIS — H52223 Regular astigmatism, bilateral: Secondary | ICD-10-CM | POA: Diagnosis not present

## 2024-03-30 DIAGNOSIS — E119 Type 2 diabetes mellitus without complications: Secondary | ICD-10-CM | POA: Diagnosis not present

## 2024-03-30 DIAGNOSIS — H25811 Combined forms of age-related cataract, right eye: Secondary | ICD-10-CM | POA: Diagnosis not present

## 2024-03-30 DIAGNOSIS — H524 Presbyopia: Secondary | ICD-10-CM | POA: Diagnosis not present

## 2024-04-04 DIAGNOSIS — M6281 Muscle weakness (generalized): Secondary | ICD-10-CM | POA: Diagnosis not present

## 2024-04-04 DIAGNOSIS — M25551 Pain in right hip: Secondary | ICD-10-CM | POA: Diagnosis not present

## 2024-04-04 DIAGNOSIS — M25511 Pain in right shoulder: Secondary | ICD-10-CM | POA: Diagnosis not present

## 2024-04-04 DIAGNOSIS — Z471 Aftercare following joint replacement surgery: Secondary | ICD-10-CM | POA: Diagnosis not present

## 2024-04-04 DIAGNOSIS — M5459 Other low back pain: Secondary | ICD-10-CM | POA: Diagnosis not present

## 2024-04-04 DIAGNOSIS — M25552 Pain in left hip: Secondary | ICD-10-CM | POA: Diagnosis not present

## 2024-04-05 DIAGNOSIS — M75121 Complete rotator cuff tear or rupture of right shoulder, not specified as traumatic: Secondary | ICD-10-CM | POA: Diagnosis not present

## 2024-04-05 DIAGNOSIS — Z96611 Presence of right artificial shoulder joint: Secondary | ICD-10-CM | POA: Diagnosis not present

## 2024-04-13 DIAGNOSIS — M25552 Pain in left hip: Secondary | ICD-10-CM | POA: Diagnosis not present

## 2024-04-13 DIAGNOSIS — Z96642 Presence of left artificial hip joint: Secondary | ICD-10-CM | POA: Diagnosis not present

## 2024-04-27 DIAGNOSIS — M25511 Pain in right shoulder: Secondary | ICD-10-CM | POA: Diagnosis not present

## 2024-04-28 DIAGNOSIS — Z4789 Encounter for other orthopedic aftercare: Secondary | ICD-10-CM | POA: Diagnosis not present

## 2024-04-28 DIAGNOSIS — M25511 Pain in right shoulder: Secondary | ICD-10-CM | POA: Diagnosis not present

## 2024-04-28 DIAGNOSIS — M6281 Muscle weakness (generalized): Secondary | ICD-10-CM | POA: Diagnosis not present

## 2024-04-30 DIAGNOSIS — B3731 Acute candidiasis of vulva and vagina: Secondary | ICD-10-CM | POA: Diagnosis not present

## 2024-04-30 DIAGNOSIS — N898 Other specified noninflammatory disorders of vagina: Secondary | ICD-10-CM | POA: Diagnosis not present

## 2024-05-03 DIAGNOSIS — M25511 Pain in right shoulder: Secondary | ICD-10-CM | POA: Diagnosis not present

## 2024-05-03 DIAGNOSIS — M6281 Muscle weakness (generalized): Secondary | ICD-10-CM | POA: Diagnosis not present

## 2024-05-03 DIAGNOSIS — Z4789 Encounter for other orthopedic aftercare: Secondary | ICD-10-CM | POA: Diagnosis not present

## 2024-05-06 ENCOUNTER — Encounter: Payer: Self-pay | Admitting: Advanced Practice Midwife

## 2024-05-11 DIAGNOSIS — M6281 Muscle weakness (generalized): Secondary | ICD-10-CM | POA: Diagnosis not present

## 2024-05-11 DIAGNOSIS — Z4789 Encounter for other orthopedic aftercare: Secondary | ICD-10-CM | POA: Diagnosis not present

## 2024-05-11 DIAGNOSIS — M25511 Pain in right shoulder: Secondary | ICD-10-CM | POA: Diagnosis not present

## 2024-06-01 DIAGNOSIS — Z4789 Encounter for other orthopedic aftercare: Secondary | ICD-10-CM | POA: Diagnosis not present

## 2024-06-01 DIAGNOSIS — M6281 Muscle weakness (generalized): Secondary | ICD-10-CM | POA: Diagnosis not present

## 2024-06-01 DIAGNOSIS — M25511 Pain in right shoulder: Secondary | ICD-10-CM | POA: Diagnosis not present

## 2024-06-07 DIAGNOSIS — M6281 Muscle weakness (generalized): Secondary | ICD-10-CM | POA: Diagnosis not present

## 2024-06-07 DIAGNOSIS — Z4789 Encounter for other orthopedic aftercare: Secondary | ICD-10-CM | POA: Diagnosis not present

## 2024-06-07 DIAGNOSIS — M25511 Pain in right shoulder: Secondary | ICD-10-CM | POA: Diagnosis not present

## 2024-06-09 DIAGNOSIS — M6281 Muscle weakness (generalized): Secondary | ICD-10-CM | POA: Diagnosis not present

## 2024-06-09 DIAGNOSIS — M25511 Pain in right shoulder: Secondary | ICD-10-CM | POA: Diagnosis not present

## 2024-06-09 DIAGNOSIS — Z4789 Encounter for other orthopedic aftercare: Secondary | ICD-10-CM | POA: Diagnosis not present

## 2024-06-14 DIAGNOSIS — M6281 Muscle weakness (generalized): Secondary | ICD-10-CM | POA: Diagnosis not present

## 2024-06-14 DIAGNOSIS — Z4789 Encounter for other orthopedic aftercare: Secondary | ICD-10-CM | POA: Diagnosis not present

## 2024-06-14 DIAGNOSIS — M25511 Pain in right shoulder: Secondary | ICD-10-CM | POA: Diagnosis not present

## 2024-06-16 DIAGNOSIS — M25511 Pain in right shoulder: Secondary | ICD-10-CM | POA: Diagnosis not present

## 2024-06-16 DIAGNOSIS — M6281 Muscle weakness (generalized): Secondary | ICD-10-CM | POA: Diagnosis not present

## 2024-06-16 DIAGNOSIS — Z4789 Encounter for other orthopedic aftercare: Secondary | ICD-10-CM | POA: Diagnosis not present

## 2024-06-22 DIAGNOSIS — Z4789 Encounter for other orthopedic aftercare: Secondary | ICD-10-CM | POA: Diagnosis not present

## 2024-06-22 DIAGNOSIS — M6281 Muscle weakness (generalized): Secondary | ICD-10-CM | POA: Diagnosis not present

## 2024-06-22 DIAGNOSIS — M25511 Pain in right shoulder: Secondary | ICD-10-CM | POA: Diagnosis not present

## 2024-06-24 DIAGNOSIS — M6281 Muscle weakness (generalized): Secondary | ICD-10-CM | POA: Diagnosis not present

## 2024-06-24 DIAGNOSIS — M25511 Pain in right shoulder: Secondary | ICD-10-CM | POA: Diagnosis not present

## 2024-06-24 DIAGNOSIS — Z4789 Encounter for other orthopedic aftercare: Secondary | ICD-10-CM | POA: Diagnosis not present

## 2024-06-27 DIAGNOSIS — M25511 Pain in right shoulder: Secondary | ICD-10-CM | POA: Diagnosis not present

## 2024-06-27 DIAGNOSIS — Z4789 Encounter for other orthopedic aftercare: Secondary | ICD-10-CM | POA: Diagnosis not present

## 2024-06-27 DIAGNOSIS — M6281 Muscle weakness (generalized): Secondary | ICD-10-CM | POA: Diagnosis not present

## 2024-06-29 DIAGNOSIS — M25511 Pain in right shoulder: Secondary | ICD-10-CM | POA: Diagnosis not present

## 2024-06-29 DIAGNOSIS — M6281 Muscle weakness (generalized): Secondary | ICD-10-CM | POA: Diagnosis not present

## 2024-06-29 DIAGNOSIS — Z4789 Encounter for other orthopedic aftercare: Secondary | ICD-10-CM | POA: Diagnosis not present

## 2024-07-04 DIAGNOSIS — M25511 Pain in right shoulder: Secondary | ICD-10-CM | POA: Diagnosis not present

## 2024-07-04 DIAGNOSIS — M6281 Muscle weakness (generalized): Secondary | ICD-10-CM | POA: Diagnosis not present

## 2024-07-04 DIAGNOSIS — Z4789 Encounter for other orthopedic aftercare: Secondary | ICD-10-CM | POA: Diagnosis not present

## 2024-07-06 DIAGNOSIS — M6281 Muscle weakness (generalized): Secondary | ICD-10-CM | POA: Diagnosis not present

## 2024-07-06 DIAGNOSIS — Z4789 Encounter for other orthopedic aftercare: Secondary | ICD-10-CM | POA: Diagnosis not present

## 2024-07-06 DIAGNOSIS — M25511 Pain in right shoulder: Secondary | ICD-10-CM | POA: Diagnosis not present

## 2024-07-07 DIAGNOSIS — M7061 Trochanteric bursitis, right hip: Secondary | ICD-10-CM | POA: Diagnosis not present

## 2024-07-07 DIAGNOSIS — M7062 Trochanteric bursitis, left hip: Secondary | ICD-10-CM | POA: Diagnosis not present

## 2024-07-11 DIAGNOSIS — M6281 Muscle weakness (generalized): Secondary | ICD-10-CM | POA: Diagnosis not present

## 2024-07-11 DIAGNOSIS — Z4789 Encounter for other orthopedic aftercare: Secondary | ICD-10-CM | POA: Diagnosis not present

## 2024-07-11 DIAGNOSIS — M25511 Pain in right shoulder: Secondary | ICD-10-CM | POA: Diagnosis not present

## 2024-07-13 ENCOUNTER — Other Ambulatory Visit: Payer: Self-pay | Admitting: Family Medicine

## 2024-07-13 DIAGNOSIS — Z1231 Encounter for screening mammogram for malignant neoplasm of breast: Secondary | ICD-10-CM

## 2024-07-13 DIAGNOSIS — Z4789 Encounter for other orthopedic aftercare: Secondary | ICD-10-CM | POA: Diagnosis not present

## 2024-07-13 DIAGNOSIS — M25511 Pain in right shoulder: Secondary | ICD-10-CM | POA: Diagnosis not present

## 2024-07-13 DIAGNOSIS — M6281 Muscle weakness (generalized): Secondary | ICD-10-CM | POA: Diagnosis not present

## 2024-07-14 ENCOUNTER — Other Ambulatory Visit: Payer: Self-pay | Admitting: Physician Assistant

## 2024-07-14 ENCOUNTER — Inpatient Hospital Stay
Admission: RE | Admit: 2024-07-14 | Discharge: 2024-07-14 | Discharge status: Home / Self Care | Source: Ambulatory Visit | Attending: Family Medicine | Admitting: Family Medicine

## 2024-07-14 DIAGNOSIS — M79621 Pain in right upper arm: Secondary | ICD-10-CM

## 2024-07-14 DIAGNOSIS — Z1231 Encounter for screening mammogram for malignant neoplasm of breast: Secondary | ICD-10-CM

## 2024-07-18 DIAGNOSIS — M25511 Pain in right shoulder: Secondary | ICD-10-CM | POA: Diagnosis not present

## 2024-07-18 DIAGNOSIS — M6281 Muscle weakness (generalized): Secondary | ICD-10-CM | POA: Diagnosis not present

## 2024-07-18 DIAGNOSIS — Z4789 Encounter for other orthopedic aftercare: Secondary | ICD-10-CM | POA: Diagnosis not present

## 2024-07-25 DIAGNOSIS — E119 Type 2 diabetes mellitus without complications: Secondary | ICD-10-CM | POA: Diagnosis not present

## 2024-07-25 DIAGNOSIS — M6281 Muscle weakness (generalized): Secondary | ICD-10-CM | POA: Diagnosis not present

## 2024-07-25 DIAGNOSIS — Z4789 Encounter for other orthopedic aftercare: Secondary | ICD-10-CM | POA: Diagnosis not present

## 2024-07-25 DIAGNOSIS — M25511 Pain in right shoulder: Secondary | ICD-10-CM | POA: Diagnosis not present

## 2024-07-27 ENCOUNTER — Ambulatory Visit
Admission: RE | Admit: 2024-07-27 | Discharge: 2024-07-27 | Disposition: A | Discharge status: Home / Self Care | Source: Ambulatory Visit | Attending: Physician Assistant | Admitting: Physician Assistant

## 2024-07-27 DIAGNOSIS — M79621 Pain in right upper arm: Secondary | ICD-10-CM

## 2024-07-27 DIAGNOSIS — M25511 Pain in right shoulder: Secondary | ICD-10-CM | POA: Diagnosis not present

## 2024-07-27 DIAGNOSIS — Z4789 Encounter for other orthopedic aftercare: Secondary | ICD-10-CM | POA: Diagnosis not present

## 2024-07-27 DIAGNOSIS — R2231 Localized swelling, mass and lump, right upper limb: Secondary | ICD-10-CM | POA: Diagnosis not present

## 2024-07-27 DIAGNOSIS — M6281 Muscle weakness (generalized): Secondary | ICD-10-CM | POA: Diagnosis not present

## 2024-07-27 DIAGNOSIS — R928 Other abnormal and inconclusive findings on diagnostic imaging of breast: Secondary | ICD-10-CM | POA: Diagnosis not present

## 2024-07-29 DIAGNOSIS — J4520 Mild intermittent asthma, uncomplicated: Secondary | ICD-10-CM | POA: Diagnosis not present

## 2024-07-29 DIAGNOSIS — I10 Essential (primary) hypertension: Secondary | ICD-10-CM | POA: Diagnosis not present

## 2024-07-29 DIAGNOSIS — Z6827 Body mass index (BMI) 27.0-27.9, adult: Secondary | ICD-10-CM | POA: Diagnosis not present

## 2024-07-29 DIAGNOSIS — H9203 Otalgia, bilateral: Secondary | ICD-10-CM | POA: Diagnosis not present

## 2024-07-29 DIAGNOSIS — Z23 Encounter for immunization: Secondary | ICD-10-CM | POA: Diagnosis not present

## 2024-07-29 DIAGNOSIS — E119 Type 2 diabetes mellitus without complications: Secondary | ICD-10-CM | POA: Diagnosis not present

## 2024-07-29 DIAGNOSIS — E7800 Pure hypercholesterolemia, unspecified: Secondary | ICD-10-CM | POA: Diagnosis not present

## 2024-07-29 DIAGNOSIS — E559 Vitamin D deficiency, unspecified: Secondary | ICD-10-CM | POA: Diagnosis not present

## 2024-07-29 DIAGNOSIS — I2510 Atherosclerotic heart disease of native coronary artery without angina pectoris: Secondary | ICD-10-CM | POA: Diagnosis not present

## 2024-08-01 DIAGNOSIS — M6281 Muscle weakness (generalized): Secondary | ICD-10-CM | POA: Diagnosis not present

## 2024-08-01 DIAGNOSIS — M25511 Pain in right shoulder: Secondary | ICD-10-CM | POA: Diagnosis not present

## 2024-08-01 DIAGNOSIS — Z4789 Encounter for other orthopedic aftercare: Secondary | ICD-10-CM | POA: Diagnosis not present

## 2024-08-03 DIAGNOSIS — M6281 Muscle weakness (generalized): Secondary | ICD-10-CM | POA: Diagnosis not present

## 2024-08-03 DIAGNOSIS — M25511 Pain in right shoulder: Secondary | ICD-10-CM | POA: Diagnosis not present

## 2024-08-03 DIAGNOSIS — Z4789 Encounter for other orthopedic aftercare: Secondary | ICD-10-CM | POA: Diagnosis not present

## 2024-08-08 ENCOUNTER — Encounter: Payer: Self-pay | Admitting: Physical Medicine and Rehabilitation

## 2024-08-09 DIAGNOSIS — Z4789 Encounter for other orthopedic aftercare: Secondary | ICD-10-CM | POA: Diagnosis not present

## 2024-08-09 DIAGNOSIS — M6281 Muscle weakness (generalized): Secondary | ICD-10-CM | POA: Diagnosis not present

## 2024-08-09 DIAGNOSIS — M25511 Pain in right shoulder: Secondary | ICD-10-CM | POA: Diagnosis not present

## 2024-08-11 DIAGNOSIS — M25511 Pain in right shoulder: Secondary | ICD-10-CM | POA: Diagnosis not present

## 2024-08-11 DIAGNOSIS — Z4789 Encounter for other orthopedic aftercare: Secondary | ICD-10-CM | POA: Diagnosis not present

## 2024-08-11 DIAGNOSIS — M6281 Muscle weakness (generalized): Secondary | ICD-10-CM | POA: Diagnosis not present

## 2024-08-15 DIAGNOSIS — M25511 Pain in right shoulder: Secondary | ICD-10-CM | POA: Diagnosis not present

## 2024-08-15 DIAGNOSIS — Z4789 Encounter for other orthopedic aftercare: Secondary | ICD-10-CM | POA: Diagnosis not present

## 2024-08-15 DIAGNOSIS — M6281 Muscle weakness (generalized): Secondary | ICD-10-CM | POA: Diagnosis not present

## 2024-08-17 DIAGNOSIS — M25511 Pain in right shoulder: Secondary | ICD-10-CM | POA: Diagnosis not present

## 2024-08-17 DIAGNOSIS — Z4789 Encounter for other orthopedic aftercare: Secondary | ICD-10-CM | POA: Diagnosis not present

## 2024-08-17 DIAGNOSIS — M6281 Muscle weakness (generalized): Secondary | ICD-10-CM | POA: Diagnosis not present

## 2024-08-22 DIAGNOSIS — M25511 Pain in right shoulder: Secondary | ICD-10-CM | POA: Diagnosis not present

## 2024-08-22 DIAGNOSIS — H8113 Benign paroxysmal vertigo, bilateral: Secondary | ICD-10-CM | POA: Diagnosis not present

## 2024-08-22 DIAGNOSIS — Z4789 Encounter for other orthopedic aftercare: Secondary | ICD-10-CM | POA: Diagnosis not present

## 2024-08-22 DIAGNOSIS — H6593 Unspecified nonsuppurative otitis media, bilateral: Secondary | ICD-10-CM | POA: Diagnosis not present

## 2024-08-22 DIAGNOSIS — M6281 Muscle weakness (generalized): Secondary | ICD-10-CM | POA: Diagnosis not present

## 2024-08-24 DIAGNOSIS — M6281 Muscle weakness (generalized): Secondary | ICD-10-CM | POA: Diagnosis not present

## 2024-08-24 DIAGNOSIS — Z4789 Encounter for other orthopedic aftercare: Secondary | ICD-10-CM | POA: Diagnosis not present

## 2024-08-24 DIAGNOSIS — M25511 Pain in right shoulder: Secondary | ICD-10-CM | POA: Diagnosis not present

## 2024-08-29 DIAGNOSIS — Z4789 Encounter for other orthopedic aftercare: Secondary | ICD-10-CM | POA: Diagnosis not present

## 2024-08-29 DIAGNOSIS — M6281 Muscle weakness (generalized): Secondary | ICD-10-CM | POA: Diagnosis not present

## 2024-08-29 DIAGNOSIS — M25511 Pain in right shoulder: Secondary | ICD-10-CM | POA: Diagnosis not present

## 2024-09-05 DIAGNOSIS — Z4789 Encounter for other orthopedic aftercare: Secondary | ICD-10-CM | POA: Diagnosis not present

## 2024-09-05 DIAGNOSIS — M6281 Muscle weakness (generalized): Secondary | ICD-10-CM | POA: Diagnosis not present

## 2024-09-05 DIAGNOSIS — M25511 Pain in right shoulder: Secondary | ICD-10-CM | POA: Diagnosis not present

## 2024-09-08 DIAGNOSIS — Z4789 Encounter for other orthopedic aftercare: Secondary | ICD-10-CM | POA: Diagnosis not present

## 2024-09-08 DIAGNOSIS — M6281 Muscle weakness (generalized): Secondary | ICD-10-CM | POA: Diagnosis not present

## 2024-09-08 DIAGNOSIS — M25511 Pain in right shoulder: Secondary | ICD-10-CM | POA: Diagnosis not present

## 2024-09-12 DIAGNOSIS — M25511 Pain in right shoulder: Secondary | ICD-10-CM | POA: Diagnosis not present

## 2024-09-12 DIAGNOSIS — Z4789 Encounter for other orthopedic aftercare: Secondary | ICD-10-CM | POA: Diagnosis not present

## 2024-09-12 DIAGNOSIS — M6281 Muscle weakness (generalized): Secondary | ICD-10-CM | POA: Diagnosis not present

## 2024-09-14 DIAGNOSIS — M6281 Muscle weakness (generalized): Secondary | ICD-10-CM | POA: Diagnosis not present

## 2024-09-14 DIAGNOSIS — Z4789 Encounter for other orthopedic aftercare: Secondary | ICD-10-CM | POA: Diagnosis not present

## 2024-09-14 DIAGNOSIS — M25511 Pain in right shoulder: Secondary | ICD-10-CM | POA: Diagnosis not present

## 2024-09-26 DIAGNOSIS — M25511 Pain in right shoulder: Secondary | ICD-10-CM | POA: Diagnosis not present

## 2024-09-28 DIAGNOSIS — M25552 Pain in left hip: Secondary | ICD-10-CM | POA: Diagnosis not present

## 2024-10-03 ENCOUNTER — Encounter: Payer: Self-pay | Admitting: Physical Medicine and Rehabilitation

## 2024-10-03 ENCOUNTER — Encounter: Admitting: Physical Medicine and Rehabilitation

## 2024-10-03 VITALS — BP 138/84 | HR 91 | Ht 65.5 in | Wt 166.2 lb

## 2024-10-03 DIAGNOSIS — R29898 Other symptoms and signs involving the musculoskeletal system: Secondary | ICD-10-CM | POA: Insufficient documentation

## 2024-10-03 DIAGNOSIS — M629 Disorder of muscle, unspecified: Secondary | ICD-10-CM | POA: Insufficient documentation

## 2024-10-03 DIAGNOSIS — Z96611 Presence of right artificial shoulder joint: Secondary | ICD-10-CM | POA: Insufficient documentation

## 2024-10-03 DIAGNOSIS — M25572 Pain in left ankle and joints of left foot: Secondary | ICD-10-CM | POA: Diagnosis present

## 2024-10-03 DIAGNOSIS — Z96642 Presence of left artificial hip joint: Secondary | ICD-10-CM | POA: Insufficient documentation

## 2024-10-03 DIAGNOSIS — M7632 Iliotibial band syndrome, left leg: Secondary | ICD-10-CM | POA: Insufficient documentation

## 2024-10-03 DIAGNOSIS — E119 Type 2 diabetes mellitus without complications: Secondary | ICD-10-CM | POA: Diagnosis not present

## 2024-10-03 DIAGNOSIS — G894 Chronic pain syndrome: Secondary | ICD-10-CM | POA: Diagnosis present

## 2024-10-03 DIAGNOSIS — G8929 Other chronic pain: Secondary | ICD-10-CM | POA: Insufficient documentation

## 2024-10-03 MED ORDER — TRAMADOL HCL 50 MG PO TABS: 50.0000 mg | ORAL_TABLET | Freq: Two times a day (BID) | ORAL | 0 refills | Status: AC | PRN

## 2024-10-03 NOTE — Progress Notes (Signed)
 Subjective:    Patient ID: Regina Kim, female    DOB: 11-14-1950, 73 y.o.   MRN: [id number]  HPI   Regina Kim is a 73 y.o. year old female  who  has a past medical history of Allergy, Angina, Arthritis, Asthma, Cataract, Complication of anesthesia (30 yrs ago), Diabetes mellitus, GERD (gastroesophageal reflux disease), Hyperlipidemia, Myocardial infarction (HCC) (2005), PONV (postoperative nausea and vomiting), SUI (stress urinary incontinence, female) (05/08/2011), Urine protein increased, and UTI (urinary tract infection).   They are presenting to PM&R clinic for  chronic pain  s/p L hip s/p THA 2024 .   Interval Hx:  Discussed the use of AI scribe software for clinical note transcription with the patient, who gave verbal consent to proceed.  History of Present Illness   Regina Kim is a 73 year old female who presents with persistent left hip and buttocks pain. She was referred by Dr. Eva Herring from [facility] for chronic pain evaluation.  She has persistent left hip and buttocks pain since a left anterior total hip arthroplasty on September 25, 2023. She had posterior dislocations and subluxations after surgery that have improved, but she still has left buttocks pain, diagnosed as trochanteric bursitis in September 2025, and ongoing left hip pain without new injury. She has had multiple left hip dislocations that spontaneously reduced after the arthroplasty.  About six weeks ago, when her hip pain worsened, she developed a marked limp and severe left ankle pain that worsens after standing more than a minute. She also has intermittent left groin pain about once a week. She uses lidocaine  patches, a heating pad, Voltaren gel on the ankle, and occasional Tylenol  and ibuprofen despite chronic kidney disease. She also receives massages for pain relief.  She has had prior spine surgery including an L5-S1 laminectomy two years ago and fusions at L3-L4 and  L4-L5. She reports persistent numbness in the left hip region for a year after surgery but without burning or dysesthetic pain. Her diabetes is brittle, which limits use of cortisone injections due to significant blood sugar spikes.  She participated in physical therapy from September 2024 through two weeks ago, including pre- and post-hip surgery rehab, but feels she plateaued and stopped therapy. She uses tizanidine occasionally at night for muscle relaxation. Prior gabapentin did not help. She avoids stronger narcotics like Percocet and Vicodin due to adverse effects, can tolerate tramadol , but prefers not to use it regularly.  Current pain is in the left buttocks, groin, and ankle, with ankle pain clearly aggravated by brief standing. She notes numbness in the left hip area without associated pain there.         Pain Inventory Average Pain 8 Pain Right Now 8 My pain is sharp  In the last 24 hours, has pain interfered with the following? General activity 7 Relation with others 4 Enjoyment of life 8 What TIME of day is your pain at its worst? morning  and night Sleep (in general) Fair  Pain is worse with: walking and inactivity Pain improves with: heat/ice and lidocaine  patches Relief from Meds: lidocaine  patches help a lot  walk without assistance how many minutes can you walk? 5 or less ability to climb steps?  yes do you drive?  yes transfers alone Do you have any goals in this area?  yes  retired I need assistance with the following:  household duties  numbness trouble walking  Any changes since last visit?  no  Primary care  [facility]ist eva Herring, MD    Family History  Problem Relation Age of Onset   Heart disease Father    Colon polyps Brother        colon ruptured last summer   Colon cancer Neg Hx    Rectal cancer Neg Hx    Stomach cancer Neg Hx    Esophageal cancer Neg Hx    Social History   Socioeconomic History   Marital  status: Married    Spouse name: Not on file   Number of children: Not on file   Years of education: Not on file   Highest education level: Not on file  Occupational History   Not on file  Tobacco Use   Smoking status: Former    Current packs/day: 0.00    Average packs/day: 1 pack/day for 30.0 years (30.0 ttl pk-yrs)    Types: Cigarettes    Start date: 03/23/1974    Quit date: 03/23/2004    Years since quitting: 20.5   Smokeless tobacco: Never  Substance and Sexual Activity   Alcohol  use: Yes    Alcohol /week: 1.0 standard drink of alcohol     Types: 1 Glasses of wine per week    Comment: occasionally   Drug use: No   Sexual activity: Not on file  Other Topics Concern   Not on file  Social History Narrative   Not on file   Social Drivers of Health   Tobacco Use: Medium Risk (10/03/2024)   Patient History    Smoking Tobacco Use: Former    Smokeless Tobacco Use: Never    Passive Exposure: Not on Actuary Strain: Not on file  Food Insecurity: Not on file  Transportation Needs: Not on file  Physical Activity: Not on file  Stress: Not on file  Social Connections: Not on file  Depression (PHQ2-9): High Risk (10/03/2024)   Depression (PHQ2-9)    PHQ-2 Score: 12  Alcohol  Screen: Not on file  Housing: Not on file  Utilities: Not on file  Health Literacy: Not on file   Past Surgical History:  Procedure Laterality Date   ABDOMINAL HYSTERECTOMY     ANTERIOR LAT LUMBAR FUSION Left 07/14/2019   Procedure: Left Lumbar two-three Anterolateral decompression/lateral plate fixation;  Surgeon: Colon Shove, MD;  Location: [facility] OR;  Service: Neurosurgery;  Laterality: Left;   BACK SURGERY     BLADDER SURGERY  05/08/2011   Bladder Sling   BLADDER SURGERY  05/08/2011   Bladder Sling   BLADDER SUSPENSION  05/08/2011   Procedure: TRANSVAGINAL TAPE (TVT) PROCEDURE;  Surgeon: Lynwood FORBES Clubs II;  Location: [facility];  Service: Gynecology;  Laterality: N/A;  Transobturator Tape  With Cystoscopy   CARDIAC CATHETERIZATION     COLONOSCOPY     CYSTOSCOPY     left total hip replacment Left 09/25/2023   POLYPECTOMY     TOTAL HIP ARTHROPLASTY  03/31/2012   Procedure: TOTAL HIP ARTHROPLASTY;  Surgeon: Dempsey JINNY Sensor, MD;  Location: [facility] OR;  Service: Orthopedics;  Laterality: Right;   Past Medical History:  Diagnosis Date   Allergy    Angina    history of angina- none in 5 yrs   Arthritis    Asthma    years ago & related to GERD   Cataract    growing now   Complication of anesthesia 30 yrs ago   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   Diabetes mellitus    dx'd  4 yrs ago-NIDDM   GERD (gastroesophageal reflux disease)    Hyperlipidemia    controlled on medicines    Myocardial infarction (HCC) 2005   no blockage found in heart cath   PONV (postoperative nausea and vomiting)    SUI (stress urinary incontinence, female) 05/08/2011   Urine protein increased    UTI (urinary tract infection)    BP 138/84 (BP Location: Left Arm, Patient Position: Sitting, Cuff Size: Normal)   Pulse 91   Ht 5' 5.5 (1.664 m)   Wt 166 lb 3.2 oz (75.4 kg)   SpO2 96%   BMI 27.24 kg/m   Opioid Risk Score:   Fall Risk Score:  `1  Depression screen [facility] 2/9     10/03/2024    1:03 PM 03/08/2016    3:57 PM  Depression screen PHQ 2/9  Decreased Interest 3 0  Down, Depressed, Hopeless 0 0  PHQ - 2 Score 3 0  Altered sleeping 2   Tired, decreased energy 3   Change in appetite 1   Feeling bad or failure about yourself  0   Trouble concentrating 0   Moving slowly or fidgety/restless 3   Suicidal thoughts 0   PHQ-9 Score 12   Difficult doing work/chores Extremely dIfficult       Review of Systems  Gastrointestinal:  Positive for abdominal pain and constipation.  Musculoskeletal:  Positive for arthralgias, back pain and myalgias.       Bilateral elbow pain, bilateral leg pain, low back pain with sciatica, left ankle pain  All other systems reviewed and are  negative.      Objective:   Physical Exam   PE: Constitution: Appropriate appearance for age. No apparent distress   Resp: No respiratory distress. No accessory muscle usage. on RA and CTAB Cardio: Well perfused appearance. No peripheral edema. Abdomen: Nondistended. Nontender.   Psych: Appropriate mood and affect. Neuro: AAOx4. No apparent cognitive deficits   Neurologic Exam:   DTRs: Reflexes were 2+ in bilateral achilles, patella Babinsky: flexor responses b/l.   Hoffmans: negative b/l Sensory exam: revealed normal sensation in all dermatomal regions in bilateral upper extremities, bilateral lower extremities, and with reduced sensation to light touch in left anteriolateral thigh  / LFC nerve distribution Motor exam: strength 5/5 throughout bilateral lower extremities Coordination: Fine motor coordination was normal.   Gait: +trendelenberg with severe right hip drop and scissoring; unstable   Hip Exam: left hip  Inspection: No gross abnormalities on inspection of hip.+ bilateral gluteal muscle atrophy  Palpation:  + TTP R hamstrings near junction of buttocks (?pirifomis), L inferior to PSIS but above gluteal muscle bulk, ischial tuberosity; no TTP SI joint  ROM:    Active: dynamic movement of the hip joint  limited in internal and external rotation; guarding ROM revealed restricted ROM in internal > external rotation  Strength: Laying Supine Rectus Femoris (straight leg)  4/5 Illiospoas (bent knee)  5/5 Internal Rotation   4/5 External Rotation  4/5 Hip Extensors (extend hip with knee straight)    4/5 Gluteus Medius (standing) -   weak.   Special/Provocative tests:  Standing flexion test- - FABERs-  - FAIR test -  - Illiac compression test -  - Yoemans (sacroillitis) - -  Gaenslens test - - Monia test -  GLENWOOD Ned test (for hip flexion contracture) - ++ bilaterally Modified Thomas test (rectus femoris and Iliopsoas tightness) - ++ R>L   Information in ()  parenthesis is normals/details of specific exam.  Assessment & Plan:   Cortney Beissel is a 73 y.o. year old female  who  has a past medical history of Allergy, Angina, Arthritis, Asthma, Cataract, Complication of anesthesia (30 yrs ago), Diabetes mellitus, GERD (gastroesophageal reflux disease), Hyperlipidemia, Myocardial infarction (HCC) (2005), PONV (postoperative nausea and vomiting), SUI (stress urinary incontinence, female) (05/08/2011), Urine protein increased, and UTI (urinary tract infection).   They are presenting to PM&R clinic for left hip pain s/p THA; notable Hx lumbar spinal fusion and R reverse shoulder arthoplasty, both with good result.  Assessment and Plan    Lumbar back pain without sciatica Chronic lumbar back pain managed with heat and massage. No radicular symptoms post-surgery. - Continue heat and massage therapy.  Hamstring tightness Bilateral hamstring tightness, more pronounced on the right, likely compensatory due to left hip weakness. - Referred to physical therapy for aqua therapy and land-based exercises to address hamstring tightness.  Left ankle pain Likely compensatory due to left hip weakness. No organic problem suspected. - Consider using a small lace-up ankle brace for support. - Continue using lidocaine  patches for pain management.  Chronic left hip pain and weakness status post total hip arthroplasty with Trendelenburg gait Chronic left hip pain and weakness post total hip arthroplasty with Trendelenburg gait. Weakness in hip muscles and compensatory hamstring tightness. No significant nerve injury. Previous PT plateaued. MRI needed to assess hip condition. - Referred to physical therapy for aqua therapy and land-based exercises to improve hip strength and gait. - Ordered MRI of the hip to assess current condition - suspicious for tendinopathy vs tear given extensive L hip weakness in gait  - Use a single-point cane in the right hand for  stability on uneven surfaces or long distances. - Prescribed tramadol  for spot treatment of severe pain or sleep disturbances.  Chronic pain syndrome Managed with topical treatments and occasional use of muscle relaxers. Tramadol  used for severe pain episodes. Avoidance of narcotics due to personal preference. - Continue using lidocaine  patches and Voltaren gel for pain management. - Consider using tizanidine or Flexeril for muscle relaxation as needed. - Prescribed tramadol  for spot treatment of severe pain or sleep disturbances.

## 2024-10-03 NOTE — Patient Instructions (Signed)
°  VISIT SUMMARY: Today, we discussed your persistent left hip and buttocks pain following your hip surgery, as well as your recent left ankle pain and overall chronic pain management. We reviewed your current treatments and made some adjustments to help manage your symptoms more effectively.  YOUR PLAN: LUMBAR BACK PAIN WITHOUT SCIATICA: You have chronic lumbar back pain that is currently managed with heat and massage therapy. -Continue using heat and massage therapy to manage your back pain.  HAMSTRING TIGHTNESS: You have tightness in your hamstrings, especially on the right side, likely due to compensating for your left hip weakness. -You are referred to physical therapy for aqua therapy and land-based exercises to help with your hamstring tightness.  LEFT ANKLE PAIN: Your left ankle pain is likely due to compensating for your left hip weakness. No organic problem is suspected. -Consider using a small lace-up ankle brace for support. -Continue using lidocaine  patches for pain management.  CHRONIC LEFT HIP PAIN AND WEAKNESS STATUS POST TOTAL HIP ARTHROPLASTY WITH TRENDELENBURG GAIT: You have chronic left hip pain and weakness following your hip surgery, which has resulted in a Trendelenburg gait. Your hip muscles are weak, and you have compensatory hamstring tightness. An MRI is needed to assess your hip condition. -You are referred to physical therapy for aqua therapy and land-based exercises to improve your hip strength and gait. -Imaging (MRI or CT) of your hip has been ordered to assess the current condition. -Use a single-point cane in your right hand for stability on uneven surfaces or long distances. -Tramadol  is prescribed for spot treatment of severe pain or sleep disturbances.  CHRONIC PAIN SYNDROME: You have chronic pain that is managed with topical treatments and occasional use of muscle relaxers. Tramadol  is used for severe pain episodes, and you avoid stronger narcotics due to  personal preference. -Continue using lidocaine  patches and Voltaren gel for pain management. -Continue using tizanidine for muscle relaxation as needed. -Tramadol  is prescribed for spot treatment of severe pain or sleep disturbances.

## 2024-10-06 ENCOUNTER — Ambulatory Visit (HOSPITAL_BASED_OUTPATIENT_CLINIC_OR_DEPARTMENT_OTHER): Admitting: Physical Therapy

## 2024-10-06 ENCOUNTER — Other Ambulatory Visit: Payer: Self-pay

## 2024-10-06 DIAGNOSIS — M25552 Pain in left hip: Secondary | ICD-10-CM | POA: Diagnosis present

## 2024-10-06 DIAGNOSIS — M6281 Muscle weakness (generalized): Secondary | ICD-10-CM | POA: Insufficient documentation

## 2024-10-06 DIAGNOSIS — Z96642 Presence of left artificial hip joint: Secondary | ICD-10-CM | POA: Insufficient documentation

## 2024-10-06 DIAGNOSIS — R29898 Other symptoms and signs involving the musculoskeletal system: Secondary | ICD-10-CM | POA: Insufficient documentation

## 2024-10-06 DIAGNOSIS — M629 Disorder of muscle, unspecified: Secondary | ICD-10-CM | POA: Insufficient documentation

## 2024-10-06 DIAGNOSIS — R262 Difficulty in walking, not elsewhere classified: Secondary | ICD-10-CM | POA: Insufficient documentation

## 2024-10-06 NOTE — Therapy (Signed)
 OUTPATIENT PHYSICAL THERAPY EVALUATION   Patient Name: Regina Kim MRN: [id number] DOB:1951/02/13, 73 y.o., female Today's Date: 10/06/2024  END OF SESSION:  PT End of Session - 10/06/24 1430     Visit Number 1    Number of Visits 20    Date for Recertification  12/17/24    Authorization Type MCR    Progress Note Due on Visit 10    PT Start Time 1430    PT Stop Time 1508    PT Time Calculation (min) 38 min    Activity Tolerance Patient tolerated treatment well    Behavior During Therapy WFL for tasks assessed/performed          Past Medical History:  Diagnosis Date   Allergy    Angina    history of angina- none in 5 yrs   Arthritis    Asthma    years ago & related to GERD   Cataract    growing now   Complication of anesthesia 30 yrs ago   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   Diabetes mellitus    dx'd 4 yrs ago-NIDDM   GERD (gastroesophageal reflux disease)    Hyperlipidemia    controlled on medicines    Myocardial infarction (HCC) 2005   no blockage found in heart cath   PONV (postoperative nausea and vomiting)    SUI (stress urinary incontinence, female) 05/08/2011   Urine protein increased    UTI (urinary tract infection)    Past Surgical History:  Procedure Laterality Date   ABDOMINAL HYSTERECTOMY     ANTERIOR LAT LUMBAR FUSION Left 07/14/2019   Procedure: Left Lumbar two-three Anterolateral decompression/lateral plate fixation;  Surgeon: Colon Shove, MD;  Location: [facility];  Service: Neurosurgery;  Laterality: Left;   BACK SURGERY     BLADDER SURGERY  05/08/2011   Bladder Sling   BLADDER SURGERY  05/08/2011   Bladder Sling   BLADDER SUSPENSION  05/08/2011   Procedure: TRANSVAGINAL TAPE (TVT) PROCEDURE;  Surgeon: Lynwood FORBES Clubs II;  Location: [facility];  Service: Gynecology;  Laterality: N/A;  Transobturator Tape With Cystoscopy   CARDIAC CATHETERIZATION     COLONOSCOPY     CYSTOSCOPY     left total hip replacment Left 09/25/2023    POLYPECTOMY     TOTAL HIP ARTHROPLASTY  03/31/2012   Procedure: TOTAL HIP ARTHROPLASTY;  Surgeon: Dempsey JINNY Sensor, MD;  Location: [facility];  Service: Orthopedics;  Laterality: Right;   Patient Active Problem List   Diagnosis Date Noted   S/P total left hip arthroplasty 10/03/2024   S/P reverse total shoulder arthroplasty, right 10/03/2024   Chronic pain syndrome 10/03/2024   It band syndrome, left 10/03/2024   Weakness of left hip 10/03/2024   Hamstring tightness of both lower extremities 10/03/2024   Chronic pain of left ankle 10/03/2024   Sensorineural hearing loss, bilateral 11/11/2023   Lumbar stenosis with neurogenic claudication 07/14/2019   Avascular necrosis of hip (HCC) 04/02/2012   SUI (stress urinary incontinence, female) 05/08/2011     REFERRING PROVIDER:  Emeline Joesph BROCKS, DO      REFERRING DIAG:  [phone] (ICD-10-CM) - S/P total left hip arthroplasty  R29.898 (ICD-10-CM) - Weakness of left hip  M62.9 (ICD-10-CM) - Hamstring tightness of both lower extremities  Evaluate and treat. Aquatherapy for left hip stabilizer weakness s/p posterior THA, and bilaterla hamstring tightness. Work on strengthening and balancing proximal LE muscles, stretching, and gait stability   Rationale for  Evaluation and Treatment: Rehabilitation  THERAPY DIAG:  Pain in left hip  Difficulty in walking, not elsewhere classified  Muscle weakness (generalized)  ONSET DATE: THA 09/25/23   SUBJECTIVE:                                                                                                                                                                                           SUBJECTIVE STATEMENT:  Subluxed with spontaneous reduction twice since surgery- once was while bending to dry leg after shower, the other was in yoga during rag doll pose. I have major HS problems- Rt side really tight and proximal tendinosis. Sit on donut or limited to 10-15 min. Was at integrative therapies-  focused more on keeping things loose but not a lot of strengthening. Up until about a month ago I was doing yoga 2/week, PT 2/week. 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   PERTINENT HISTORY:  Per MD note: multiple Lt hip dislocations with spontaneous reduction since THA, about 6 wks ago incr pain with pain to ankle and intermittent Lt groin pain;  H/o L5-S1 laminectomy, fusions at L3-5. Was in PT for 1 year since THA.  Rt reverse TSA in June 2025.    PAIN:  Are you having pain? Yes: NPRS scale: moderate Pain location: Lt glut in seated, lateral Lt ankle when I stand for about a minute Pain description: sharp/stabbing Aggravating factors: getting up at night to go to the bathroom, sitting, standing for about a minute Relieving factors: the more I am up and moving  PRECAUTIONS:  None  RED FLAGS: None   WEIGHT BEARING RESTRICTIONS:  No  FALLS:  Has patient fallen in last 6 months? No  LIVING ENVIRONMENT: Only stairs are to get into/out of the house  OCCUPATION:  Retired, surveyor, minerals  PLOF:  Independent  PATIENT GOALS:  Yoga, glass work, decrease pain    OBJECTIVE:  Note: Objective measures were completed at Evaluation unless otherwise noted.  DIAGNOSTIC FINDINGS:  Lt Hip MRI ordered, scheduled 10/23/24  PATIENT SURVEYS:  LEFS  Extreme difficulty/unable (0), Quite a bit of difficulty (1), Moderate difficulty (2), Little difficulty (3), No difficulty (4) Survey date:  12/18  Any of your usual work, housework or school activities 2  2. Usual hobbies, recreational or sporting activities 2  3. Getting into/out of the bath 1  4. Walking between rooms 2  5. Putting on socks/shoes 2  6. Squatting  1  7. Lifting an object, like a bag of groceries from the floor 2  8. Performing light activities around your home 2  9. Performing heavy activities around your home 1  10. Getting into/out of a car 3   11. Walking 2 blocks 1  12. Walking 1 mile 0  13. Going up/down 10 stairs (1 flight) 1  14. Standing for 1 hour 2  15.  sitting for 1 hour 1  16. Running on even ground 0  17. Running on uneven ground 0  18. Making sharp turns while running fast 0  19. Hopping  0  20. Rolling over in bed 2  Score total:  25     COGNITIVE STATUS: Within functional limits for tasks assessed   SENSATION: Eval: numbness on left lateral thigh  EDEMA:  No  POSTURE:  Eval: wearing heel lift in left shoe Thoracic levoscoliosis with Rt shoulder drop Tends to sit with Lt leg crossed over Rt    GAIT: Comments: bilateral scissoring, Lt trendelenburg   Body Part #1 Hip   LOWER EXTREMITY ROM:     Eval: demo full available ROM through LEs   LOWER EXTREMITY MMT:     EVAL: gross MMT 4/5 in straight plane testing, is able to demo glut med activation in Lt SLS while using UE support and required cuing, unable to hold                                                                                                            TREATMENT DATE:   EVAL 10/06/24 Heel strike+ glut set to decrease scissoring & Lt trendelenburg  In mirror- practiced weight shift portion of gait to reduce right hip drop Discussed aquatic exercise & transition to land   PATIENT EDUCATION:  Education details: Anatomy of condition, POC, HEP, exercise form/rationale  Person educated: Patient Education method: Explanation, Demonstration, Tactile cues, and Verbal cues Education comprehension: verbalized understanding, returned demonstration, verbal cues required, tactile cues required, and needs further education  HOME EXERCISE PROGRAM: Heel strike + glut set to decrease scissoring, SLS with hip hike   ASSESSMENT:  CLINICAL IMPRESSION: Patient is a 73 y.o. F who was seen today for physical therapy evaluation and treatment for weakness and pain in Lt hip and leg 1 year status post THA. Pt did have 2 incidences of  subluxation posteriorly but has not had it happen again. Poor CKC activation of Lt hip with scissoring resulting in biomechanical chain instability, overuse of TFL and ITB, and impingement of lateral ankle. Pt will benefit from skilled PT to address deficits and improve ambulation tolerance. Aquatic PT will be helpful to improve proper activation through upright support of the water  and safety. Pt will then transition to land-based PT for further functional training.  Discussed upcoming MRI will be important to rule out tearing of hip abductor group.      REHAB POTENTIAL: Good  CLINICAL DECISION MAKING: Stable/uncomplicated  EVALUATION COMPLEXITY: Low   GOALS: Goals reviewed with patient? Yes  SHORT TERM GOALS: Target date: 11/05/24  Able to demo heel strike consistently in ambulation Baseline: Goal status: INITIAL  2.  Demo step up in pool with level pelvis Baseline:  Goal status: INITIAL  3.  Prepared for transition to land-based rehab after 4 weeks of aquatics Baseline: may need to extend goal date depending on scheduling Goal status: INITIAL    LONG TERM GOALS: Target date: POC date  Ambulation without scissoring Baseline:  Goal status: INITIAL  2.  Able to stand for at least 10 min pain <=3/10 Baseline:  Goal status: INITIAL  3.  LEFS to improve by MDC Baseline:  Goal status: INITIAL  4.  Return to yoga without limitation by hip pain Baseline:  Goal status: INITIAL    PLAN:  PT FREQUENCY: 1-2x/week  PT DURATION: POC date  PLANNED INTERVENTIONS: 97164- PT Re-evaluation, 97750- Physical Performance Testing, 97110-Therapeutic exercises, 97530- Therapeutic activity, 97112- Neuromuscular re-education, 97535- Self Care, 02859- Manual therapy, [phone]- Gait training, [phone]- Aquatic Therapy, [phone] (1-2 muscles), [phone] (3+ muscles)- Dry Needling, Patient/Family education, Balance training, Stair training, Taping, Joint mobilization, Spinal mobilization, Scar  mobilization, Cryotherapy, and Moist heat.  PLAN FOR NEXT SESSION: begin aquatics   [place], [place], DPT 10/06/2024, 3:55 PM

## 2024-10-11 ENCOUNTER — Encounter (HOSPITAL_BASED_OUTPATIENT_CLINIC_OR_DEPARTMENT_OTHER): Payer: Self-pay | Admitting: Physical Therapy

## 2024-10-11 ENCOUNTER — Ambulatory Visit (HOSPITAL_BASED_OUTPATIENT_CLINIC_OR_DEPARTMENT_OTHER): Admitting: Physical Therapy

## 2024-10-11 DIAGNOSIS — M25552 Pain in left hip: Secondary | ICD-10-CM | POA: Diagnosis not present

## 2024-10-11 DIAGNOSIS — M6281 Muscle weakness (generalized): Secondary | ICD-10-CM

## 2024-10-11 DIAGNOSIS — R262 Difficulty in walking, not elsewhere classified: Secondary | ICD-10-CM

## 2024-10-11 NOTE — Therapy (Signed)
 " OUTPATIENT PHYSICAL THERAPY TREATMENT   Patient Name: Regina Kim MRN: [id number] DOB:[birth year], 73 y.o., female Today's Date: 10/11/2024  END OF SESSION:  PT End of Session - 10/11/24 1701     Visit Number 2    Number of Visits 20    Date for Recertification  12/17/24    Authorization Type MCR    Progress Note Due on Visit 10    PT Start Time 1400    PT Stop Time 1440    PT Time Calculation (min) 40 min    Activity Tolerance Patient tolerated treatment well    Behavior During Therapy WFL for tasks assessed/performed           Past Medical History:  Diagnosis Date   Allergy    Angina    history of angina- none in 5 yrs   Arthritis    Asthma    years ago & related to GERD   Cataract    growing now   Complication of anesthesia 30 yrs ago   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   Diabetes mellitus    dx'd 4 yrs ago-NIDDM   GERD (gastroesophageal reflux disease)    Hyperlipidemia    controlled on medicines    Myocardial infarction (HCC) 2005   no blockage found in heart cath   PONV (postoperative nausea and vomiting)    SUI (stress urinary incontinence, female) 05/08/2011   Urine protein increased    UTI (urinary tract infection)    Past Surgical History:  Procedure Laterality Date   ABDOMINAL HYSTERECTOMY     ANTERIOR LAT LUMBAR FUSION Left 07/14/2019   Procedure: Left Lumbar two-three Anterolateral decompression/lateral plate fixation;  Surgeon: Colon Shove, MD;  Location: [facility];  Service: Neurosurgery;  Laterality: Left;   BACK SURGERY     BLADDER SURGERY  05/08/2011   Bladder Sling   BLADDER SURGERY  05/08/2011   Bladder Sling   BLADDER SUSPENSION  05/08/2011   Procedure: TRANSVAGINAL TAPE (TVT) PROCEDURE;  Surgeon: Lynwood FORBES Clubs II;  Location: [facility];  Service: Gynecology;  Laterality: N/A;  Transobturator Tape With Cystoscopy   CARDIAC CATHETERIZATION     COLONOSCOPY     CYSTOSCOPY     left total hip replacment Left  09/25/2023   POLYPECTOMY     TOTAL HIP ARTHROPLASTY  03/31/2012   Procedure: TOTAL HIP ARTHROPLASTY;  Surgeon: Dempsey JINNY Sensor, MD;  Location: [facility];  Service: Orthopedics;  Laterality: Right;   Patient Active Problem List   Diagnosis Date Noted   S/P total left hip arthroplasty 10/03/2024   S/P reverse total shoulder arthroplasty, right 10/03/2024   Chronic pain syndrome 10/03/2024   It band syndrome, left 10/03/2024   Weakness of left hip 10/03/2024   Hamstring tightness of both lower extremities 10/03/2024   Chronic pain of left ankle 10/03/2024   Sensorineural hearing loss, bilateral 11/11/2023   Lumbar stenosis with neurogenic claudication 07/14/2019   Avascular necrosis of hip (HCC) 04/02/2012   SUI (stress urinary incontinence, female) 05/08/2011     REFERRING PROVIDER:  Emeline Joesph BROCKS, DO      REFERRING DIAG:  [phone] (ICD-10-CM) - S/P total left hip arthroplasty  R29.898 (ICD-10-CM) - Weakness of left hip  M62.9 (ICD-10-CM) - Hamstring tightness of both lower extremities  Evaluate and treat. Aquatherapy for left hip stabilizer weakness s/p posterior THA, and bilaterla hamstring tightness. Work on strengthening and balancing proximal LE muscles, stretching, and gait stability  Rationale for Evaluation and Treatment: Rehabilitation  THERAPY DIAG:  Pain in left hip  Difficulty in walking, not elsewhere classified  Muscle weakness (generalized)  ONSET DATE: THA 09/25/23   SUBJECTIVE:                                                                                                                                                                                           SUBJECTIVE STATEMENT: I am so much better since she showed me how to walk.  My pain is seriously down by 1/2 pain right hip 4/10 from 8/10  Initial Subjective Subluxed with spontaneous reduction twice since surgery- once was while bending to dry leg after shower, the other was in yoga during rag  doll pose. I have major HS problems- Rt side really tight and proximal tendinosis. Sit on donut or limited to 10-15 min. Was at integrative therapies- focused more on keeping things loose but not a lot of strengthening. Up until about a month ago I was doing yoga 2/week, PT 2/week. 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   PERTINENT HISTORY:  Per MD note: multiple Lt hip dislocations with spontaneous reduction since THA, about 6 wks ago incr pain with pain to ankle and intermittent Lt groin pain;  H/o L5-S1 laminectomy, fusions at L3-5. Was in PT for 1 year since THA.  Rt reverse TSA in June 2025.    PAIN:  Are you having pain? Yes: NPRS scale: moderate Pain location: Lt glut in seated, lateral Lt ankle when I stand for about a minute Pain description: sharp/stabbing Aggravating factors: getting up at night to go to the bathroom, sitting, standing for about a minute Relieving factors: the more I am up and moving  PRECAUTIONS:  None  RED FLAGS: None   WEIGHT BEARING RESTRICTIONS:  No  FALLS:  Has patient fallen in last 6 months? No  LIVING ENVIRONMENT: Only stairs are to get into/out of the house  OCCUPATION:  Retired, surveyor, minerals  PLOF:  Independent  PATIENT GOALS:  Yoga, glass work, decrease pain    OBJECTIVE:  Note: Objective measures were completed at Evaluation unless otherwise noted.  DIAGNOSTIC FINDINGS:  Lt Hip MRI ordered, scheduled 10/23/24  PATIENT SURVEYS:  LEFS  Extreme difficulty/unable (0), Quite a bit of difficulty (1), Moderate difficulty (2), Little difficulty (3), No difficulty (4) Survey date:  12/18  Any of your usual work, housework or school activities 2  2. Usual hobbies, recreational or sporting activities 2  3. Getting into/out of the bath 1  4.  Walking between rooms 2  5. Putting on socks/shoes 2  6. Squatting  1  7. Lifting an object, like a bag of groceries from  the floor 2  8. Performing light activities around your home 2  9. Performing heavy activities around your home 1  10. Getting into/out of a car 3  11. Walking 2 blocks 1  12. Walking 1 mile 0  13. Going up/down 10 stairs (1 flight) 1  14. Standing for 1 hour 2  15.  sitting for 1 hour 1  16. Running on even ground 0  17. Running on uneven ground 0  18. Making sharp turns while running fast 0  19. Hopping  0  20. Rolling over in bed 2  Score total:  25     COGNITIVE STATUS: Within functional limits for tasks assessed   SENSATION: Eval: numbness on left lateral thigh  EDEMA:  No  POSTURE:  Eval: wearing heel lift in left shoe Thoracic levoscoliosis with Rt shoulder drop Tends to sit with Lt leg crossed over Rt    GAIT: Comments: bilateral scissoring, Lt trendelenburg   Body Part #1 Hip   LOWER EXTREMITY ROM:     Eval: demo full available ROM through LEs   LOWER EXTREMITY MMT:     EVAL: gross MMT 4/5 in straight plane testing, is able to demo glut med activation in Lt SLS while using UE support and required cuing, unable to hold                                                                                                            TREATMENT DATE:  [facility] Adult PT Treatment:                                                DATE: 10/11/24 Pt seen for aquatic therapy today.  Treatment took place in water  3.5-4.75 ft in depth at the [facility] pool. Temp of water  was 91.  Pt entered/exited the pool via stairs suing step to pattern with hand rail.  *Intro to setting *walking forward, back and side stepping in 3.6 ft with unsupported; cues for step width and heel strike *hip hiking R/L with 10s hold bottom step *rider band resisted Lt hip abd and ext 3 x 10 3.6 ft  *noodle scoots using yellow noodle straddled and another ue for balance. VC and demonstration for execution *step up bottom step leading L x 5 (sling increase in hip pain, L x8  Pt requires  the buoyancy and hydrostatic pressure of water  for support, and to offload joints by unweighting joint load by at least 50 % in navel deep water  and by at least 75-80% in chest to neck deep water .  Viscosity of the water  is needed for resistance of strengthening. Water  current perturbations provides challenge to standing balance requiring increased core activation.      PATIENT EDUCATION:  Education details: Anatomy of condition, POC, HEP, exercise form/rationale  Person educated: Patient Education method: Explanation, Demonstration, Tactile cues, and Verbal cues Education comprehension: verbalized understanding, returned demonstration, verbal cues required, tactile cues required, and needs further education  HOME EXERCISE PROGRAM: Heel strike + glut set to decrease scissoring, SLS with hip hike   ASSESSMENT:  CLINICAL IMPRESSION: Pt demonstrates safety and independence in aquatic setting with therapist instructing from deck. She is confident in setting, moving throughout all depths easily.  Pt is directed through various movement patterns and trials in both sitting and standing positions.   Pt is provided VC and demonstration throughout session for execution of exercises while monitoring toleration. Pt reports significant reduction in pain with instruction given last session for improving gait pattern.  She does begin session with low (for pt ) pain sensitivity 4/10 which does rise slightly throughout session.  Session focused on left hip abductor and extension strength. Goals are ongoing.     Initial Impression Patient is a 73 y.o. F who was seen today for physical therapy evaluation and treatment for weakness and pain in Lt hip and leg 1 year status post THA. Pt did have 2 incidences of subluxation posteriorly but has not had it happen again. Poor CKC activation of Lt hip with scissoring resulting in biomechanical chain instability, overuse of TFL and ITB, and impingement of lateral ankle.  Pt will benefit from skilled PT to address deficits and improve ambulation tolerance. Aquatic PT will be helpful to improve proper activation through upright support of the water  and safety. Pt will then transition to land-based PT for further functional training.  Discussed upcoming MRI will be important to rule out tearing of hip abductor group.      REHAB POTENTIAL: Good  CLINICAL DECISION MAKING: Stable/uncomplicated  EVALUATION COMPLEXITY: Low   GOALS: Goals reviewed with patient? Yes  SHORT TERM GOALS: Target date: 11/05/24  Able to demo heel strike consistently in ambulation Baseline: Goal status: INITIAL  2.  Demo step up in pool with level pelvis Baseline:  Goal status: INITIAL  3.  Prepared for transition to land-based rehab after 4 weeks of aquatics Baseline: may need to extend goal date depending on scheduling Goal status: INITIAL    LONG TERM GOALS: Target date: POC date  Ambulation without scissoring Baseline:  Goal status: INITIAL  2.  Able to stand for at least 10 min pain <=3/10 Baseline:  Goal status: INITIAL  3.  LEFS to improve by MDC Baseline:  Goal status: INITIAL  4.  Return to yoga without limitation by hip pain Baseline:  Goal status: INITIAL    PLAN:  PT FREQUENCY: 1-2x/week  PT DURATION: POC date  PLANNED INTERVENTIONS: 97164- PT Re-evaluation, 97750- Physical Performance Testing, 97110-Therapeutic exercises, 97530- Therapeutic activity, 97112- Neuromuscular re-education, 97535- Self Care, 02859- Manual therapy, [phone]- Gait training, [phone]- Aquatic Therapy, [phone] (1-2 muscles), 20561 (3+ muscles)- Dry Needling, Patient/Family education, Balance training, Stair training, Taping, Joint mobilization, Spinal mobilization, Scar mobilization, Cryotherapy, and Moist heat.  PLAN FOR NEXT SESSION: begin aquatics   Regina Kim) Regina Kim MPT 10/11/2024 5:02 PM [facility] [address] [place], [place], [address] Phone: [phone]   Fax:  [phone]   "

## 2024-10-14 ENCOUNTER — Encounter (HOSPITAL_BASED_OUTPATIENT_CLINIC_OR_DEPARTMENT_OTHER): Payer: Self-pay | Admitting: Physical Therapy

## 2024-10-14 ENCOUNTER — Ambulatory Visit (HOSPITAL_BASED_OUTPATIENT_CLINIC_OR_DEPARTMENT_OTHER): Admitting: Physical Therapy

## 2024-10-14 DIAGNOSIS — M6281 Muscle weakness (generalized): Secondary | ICD-10-CM

## 2024-10-14 DIAGNOSIS — R262 Difficulty in walking, not elsewhere classified: Secondary | ICD-10-CM

## 2024-10-14 DIAGNOSIS — M25552 Pain in left hip: Secondary | ICD-10-CM | POA: Diagnosis not present

## 2024-10-14 NOTE — Therapy (Signed)
 " OUTPATIENT PHYSICAL THERAPY TREATMENT   Patient Name: Regina Kim MRN: [id number] DOB:05/17/1951, 73 y.o., female Today's Date: 10/14/2024  END OF SESSION:  PT End of Session - 10/14/24 1153     Visit Number 3    Number of Visits 20    Date for Recertification  12/17/24    Authorization Type MCR    Progress Note Due on Visit 10    PT Start Time 1145    PT Stop Time 1225    PT Time Calculation (min) 40 min    Activity Tolerance Patient tolerated treatment well    Behavior During Therapy WFL for tasks assessed/performed            Past Medical History:  Diagnosis Date   Allergy    Angina    history of angina- none in 5 yrs   Arthritis    Asthma    years ago & related to GERD   Cataract    growing now   Complication of anesthesia 30 yrs ago   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   Diabetes mellitus    dx'd 4 yrs ago-NIDDM   GERD (gastroesophageal reflux disease)    Hyperlipidemia    controlled on medicines    Myocardial infarction (HCC) 2005   no blockage found in heart cath   PONV (postoperative nausea and vomiting)    SUI (stress urinary incontinence, female) 05/08/2011   Urine protein increased    UTI (urinary tract infection)    Past Surgical History:  Procedure Laterality Date   ABDOMINAL HYSTERECTOMY     ANTERIOR LAT LUMBAR FUSION Left 07/14/2019   Procedure: Left Lumbar two-three Anterolateral decompression/lateral plate fixation;  Surgeon: Colon Shove, MD;  Location: [facility];  Service: Neurosurgery;  Laterality: Left;   BACK SURGERY     BLADDER SURGERY  05/08/2011   Bladder Sling   BLADDER SURGERY  05/08/2011   Bladder Sling   BLADDER SUSPENSION  05/08/2011   Procedure: TRANSVAGINAL TAPE (TVT) PROCEDURE;  Surgeon: Lynwood FORBES Clubs II;  Location: [facility];  Service: Gynecology;  Laterality: N/A;  Transobturator Tape With Cystoscopy   CARDIAC CATHETERIZATION     COLONOSCOPY     CYSTOSCOPY     left total hip replacment Left  09/25/2023   POLYPECTOMY     TOTAL HIP ARTHROPLASTY  03/31/2012   Procedure: TOTAL HIP ARTHROPLASTY;  Surgeon: Dempsey JINNY Sensor, MD;  Location: [facility];  Service: Orthopedics;  Laterality: Right;   Patient Active Problem List   Diagnosis Date Noted   S/P total left hip arthroplasty 10/03/2024   S/P reverse total shoulder arthroplasty, right 10/03/2024   Chronic pain syndrome 10/03/2024   It band syndrome, left 10/03/2024   Weakness of left hip 10/03/2024   Hamstring tightness of both lower extremities 10/03/2024   Chronic pain of left ankle 10/03/2024   Sensorineural hearing loss, bilateral 11/11/2023   Lumbar stenosis with neurogenic claudication 07/14/2019   Avascular necrosis of hip (HCC) 04/02/2012   SUI (stress urinary incontinence, female) 05/08/2011     REFERRING PROVIDER:  Emeline Joesph BROCKS, DO      REFERRING DIAG:  [phone] (ICD-10-CM) - S/P total left hip arthroplasty  R29.898 (ICD-10-CM) - Weakness of left hip  M62.9 (ICD-10-CM) - Hamstring tightness of both lower extremities  Evaluate and treat. Aquatherapy for left hip stabilizer weakness s/p posterior THA, and bilaterla hamstring tightness. Work on strengthening and balancing proximal LE muscles, stretching, and gait stability  Rationale for Evaluation and Treatment: Rehabilitation  THERAPY DIAG:  Pain in left hip  Difficulty in walking, not elsewhere classified  Muscle weakness (generalized)  ONSET DATE: THA 09/25/23   SUBJECTIVE:                                                                                                                                                                                           SUBJECTIVE STATEMENT: Pt reports she did well shortly after aquatic therapy session, but later in evening pain increased (ache) and woke her up in the night.  She continues to complete senior yoga 2x/wk.   Initial Subjective Subluxed with spontaneous reduction twice since surgery- once was while  bending to dry leg after shower, the other was in yoga during rag doll pose. I have major HS problems- Rt side really tight and proximal tendinosis. Sit on donut or limited to 10-15 min. Was at integrative therapies- focused more on keeping things loose but not a lot of strengthening. Up until about a month ago I was doing yoga 2/week, PT 2/week. 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   PERTINENT HISTORY:  Per MD note: multiple Lt hip dislocations with spontaneous reduction since THA, about 6 wks ago incr pain with pain to ankle and intermittent Lt groin pain;  H/o L5-S1 laminectomy, fusions at L3-5. Was in PT for 1 year since THA.  Rt reverse TSA in June 2025.    PAIN:  Are you having pain? Yes: NPRS scale: 3/10 pretty good. Pain location: Lt glut in seated, lateral Lt ankle when I stand for about a minute Pain description: sharp/stabbing Aggravating factors: getting up at night to go to the bathroom, sitting, standing for about a minute Relieving factors: the more I am up and moving  PRECAUTIONS:  None  RED FLAGS: None   WEIGHT BEARING RESTRICTIONS:  No  FALLS:  Has patient fallen in last 6 months? No  LIVING ENVIRONMENT: Only stairs are to get into/out of the house  OCCUPATION:  Retired, surveyor, minerals  PLOF:  Independent  PATIENT GOALS:  Yoga, glass work, decrease pain    OBJECTIVE:  Note: Objective measures were completed at Evaluation unless otherwise noted.  DIAGNOSTIC FINDINGS:  Lt Hip MRI ordered, scheduled 10/23/24  PATIENT SURVEYS:  LEFS  Extreme difficulty/unable (0), Quite a bit of difficulty (1), Moderate difficulty (2), Little difficulty (3), No difficulty (4) Survey date:  12/18  Any of your usual work, housework or school activities 2  2. Usual hobbies, recreational or sporting activities 2  3. Getting into/out of the bath 1  4. Walking between rooms 2  5. Putting on  socks/shoes 2  6. Squatting  1  7. Lifting an object, like a bag of groceries from the floor 2  8. Performing light activities around your home 2  9. Performing heavy activities around your home 1  10. Getting into/out of a car 3  11. Walking 2 blocks 1  12. Walking 1 mile 0  13. Going up/down 10 stairs (1 flight) 1  14. Standing for 1 hour 2  15.  sitting for 1 hour 1  16. Running on even ground 0  17. Running on uneven ground 0  18. Making sharp turns while running fast 0  19. Hopping  0  20. Rolling over in bed 2  Score total:  25     COGNITIVE STATUS: Within functional limits for tasks assessed   SENSATION: Eval: numbness on left lateral thigh  EDEMA:  No  POSTURE:  Eval: wearing heel lift in left shoe Thoracic levoscoliosis with Rt shoulder drop Tends to sit with Lt leg crossed over Rt    GAIT: Comments: bilateral scissoring, Lt trendelenburg   Body Part #1 Hip   LOWER EXTREMITY ROM:     Eval: demo full available ROM through LEs   LOWER EXTREMITY MMT:     EVAL: gross MMT 4/5 in straight plane testing, is able to demo glut med activation in Lt SLS while using UE support and required cuing, unable to hold                                                                                                            TREATMENT DATE:  [facility] Adult PT Treatment:                                                DATE: 10/14/24 Pt seen for aquatic therapy today.  Treatment took place in water  3.5-4.75 ft in depth at the [facility] pool. Temp of water  was 91.  Pt entered/exited the pool via stairs suing step to pattern with hand rail.  *walking forward/  backward with cues for even step length, slight increase in step width, and to reduce speed * side stepping R/L  * suitcase carry with bil / single rainbow hand float under water  at side, marching backward/ forwards  *UE On rainbow hand floats:  3 way LE kick x8 each; hip circles x 10  * straddling noodle, UE  on corner-> hand floats-> corner:  cycling, hip abdct/ add, hip flexion/extension, cycling * backward noodle scoots sitting on yellow noodle - cues for 90 deg knee flexion *L runners forward step up/ Rt retro step downs x 10 with light UE on rails.   * Hip hikes x 5 each side (painful on Lt when hiking Lt)   PATIENT EDUCATION:  Education details: exercise form/rationale Person educated: Patient Education method:  Explanation, Demonstration, Tactile cues, and Verbal cues Education comprehension: verbalized understanding, returned demonstration, verbal cues required, tactile cues required, and needs further education  HOME EXERCISE PROGRAM: Heel strike + glut set to decrease scissoring, SLS with hip hike   ASSESSMENT:  CLINICAL IMPRESSION: Pt reported pain remained around 2-4/10 during session.   Session focused on left hip abductor and extension strength and normalizing gait. Will continue to progress as tolerated. Goals are ongoing.     Initial Impression Patient is a 73 y.o. F who was seen today for physical therapy evaluation and treatment for weakness and pain in Lt hip and leg 1 year status post THA. Pt did have 2 incidences of subluxation posteriorly but has not had it happen again. Poor CKC activation of Lt hip with scissoring resulting in biomechanical chain instability, overuse of TFL and ITB, and impingement of lateral ankle. Pt will benefit from skilled PT to address deficits and improve ambulation tolerance. Aquatic PT will be helpful to improve proper activation through upright support of the water  and safety. Pt will then transition to land-based PT for further functional training.  Discussed upcoming MRI will be important to rule out tearing of hip abductor group.      REHAB POTENTIAL: Good  CLINICAL DECISION MAKING: Stable/uncomplicated  EVALUATION COMPLEXITY: Low   GOALS: Goals reviewed with patient? Yes  SHORT TERM GOALS: Target date: 11/05/24  Able to demo heel  strike consistently in ambulation Baseline: Goal status: INITIAL  2.  Demo step up in pool with level pelvis Baseline:  Goal status: INITIAL  3.  Prepared for transition to land-based rehab after 4 weeks of aquatics Baseline: may need to extend goal date depending on scheduling Goal status: INITIAL    LONG TERM GOALS: Target date: POC date  Ambulation without scissoring Baseline:  Goal status: INITIAL  2.  Able to stand for at least 10 min pain <=3/10 Baseline:  Goal status: INITIAL  3.  LEFS to improve by MDC Baseline:  Goal status: INITIAL  4.  Return to yoga without limitation by hip pain Baseline:  Goal status: INITIAL    PLAN:  PT FREQUENCY: 1-2x/week  PT DURATION: POC date  PLANNED INTERVENTIONS: 97164- PT Re-evaluation, 97750- Physical Performance Testing, 97110-Therapeutic exercises, 97530- Therapeutic activity, 97112- Neuromuscular re-education, 97535- Self Care, 02859- Manual therapy, [phone]- Gait training, [phone]- Aquatic Therapy, [phone] (1-2 muscles), 20561 (3+ muscles)- Dry Needling, Patient/Family education, Balance training, Stair training, Taping, Joint mobilization, Spinal mobilization, Scar mobilization, Cryotherapy, and Moist heat.  PLAN FOR NEXT SESSION: continue aquatic therapy   Delon Aquas, PTA 10/14/2024 1:11 PM [facility] [address] [place], [place], [address] Phone: [phone]   Fax:  [phone]  "

## 2024-10-18 ENCOUNTER — Ambulatory Visit (HOSPITAL_BASED_OUTPATIENT_CLINIC_OR_DEPARTMENT_OTHER): Admitting: Physical Therapy

## 2024-10-18 ENCOUNTER — Encounter (HOSPITAL_BASED_OUTPATIENT_CLINIC_OR_DEPARTMENT_OTHER): Payer: Self-pay | Admitting: Physical Therapy

## 2024-10-18 DIAGNOSIS — M25552 Pain in left hip: Secondary | ICD-10-CM

## 2024-10-18 DIAGNOSIS — R262 Difficulty in walking, not elsewhere classified: Secondary | ICD-10-CM

## 2024-10-18 DIAGNOSIS — M6281 Muscle weakness (generalized): Secondary | ICD-10-CM

## 2024-10-18 NOTE — Therapy (Signed)
 " OUTPATIENT PHYSICAL THERAPY TREATMENT   Patient Name: Regina Kim MRN: [id number] DOB:1950-11-03, 73 y.o., female Today's Date: 10/18/2024  END OF SESSION:  PT End of Session - 10/18/24 1459     Visit Number 4    Number of Visits 20    Date for Recertification  12/17/24    Authorization Type MCR    Progress Note Due on Visit 10    PT Start Time 1445    PT Stop Time 1525    PT Time Calculation (min) 40 min    Activity Tolerance Patient tolerated treatment well    Behavior During Therapy WFL for tasks assessed/performed            Past Medical History:  Diagnosis Date   Allergy    Angina    history of angina- none in 5 yrs   Arthritis    Asthma    years ago & related to GERD   Cataract    growing now   Complication of anesthesia 30 yrs ago   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   Diabetes mellitus    dx'd 4 yrs ago-NIDDM   GERD (gastroesophageal reflux disease)    Hyperlipidemia    controlled on medicines    Myocardial infarction (HCC) 2005   no blockage found in heart cath   PONV (postoperative nausea and vomiting)    SUI (stress urinary incontinence, female) 05/08/2011   Urine protein increased    UTI (urinary tract infection)    Past Surgical History:  Procedure Laterality Date   ABDOMINAL HYSTERECTOMY     ANTERIOR LAT LUMBAR FUSION Left 07/14/2019   Procedure: Left Lumbar two-three Anterolateral decompression/lateral plate fixation;  Surgeon: Colon Shove, MD;  Location: [facility];  Service: Neurosurgery;  Laterality: Left;   BACK SURGERY     BLADDER SURGERY  05/08/2011   Bladder Sling   BLADDER SURGERY  05/08/2011   Bladder Sling   BLADDER SUSPENSION  05/08/2011   Procedure: TRANSVAGINAL TAPE (TVT) PROCEDURE;  Surgeon: Lynwood FORBES Clubs II;  Location: [facility];  Service: Gynecology;  Laterality: N/A;  Transobturator Tape With Cystoscopy   CARDIAC CATHETERIZATION     COLONOSCOPY     CYSTOSCOPY     left total hip replacment Left  09/25/2023   POLYPECTOMY     TOTAL HIP ARTHROPLASTY  03/31/2012   Procedure: TOTAL HIP ARTHROPLASTY;  Surgeon: Dempsey JINNY Sensor, MD;  Location: [facility];  Service: Orthopedics;  Laterality: Right;   Patient Active Problem List   Diagnosis Date Noted   S/P total left hip arthroplasty 10/03/2024   S/P reverse total shoulder arthroplasty, right 10/03/2024   Chronic pain syndrome 10/03/2024   It band syndrome, left 10/03/2024   Weakness of left hip 10/03/2024   Hamstring tightness of both lower extremities 10/03/2024   Chronic pain of left ankle 10/03/2024   Sensorineural hearing loss, bilateral 11/11/2023   Lumbar stenosis with neurogenic claudication 07/14/2019   Avascular necrosis of hip (HCC) 04/02/2012   SUI (stress urinary incontinence, female) 05/08/2011     REFERRING PROVIDER:  Emeline Joesph BROCKS, DO      REFERRING DIAG:  [phone] (ICD-10-CM) - S/P total left hip arthroplasty  R29.898 (ICD-10-CM) - Weakness of left hip  M62.9 (ICD-10-CM) - Hamstring tightness of both lower extremities  Evaluate and treat. Aquatherapy for left hip stabilizer weakness s/p posterior THA, and bilaterla hamstring tightness. Work on strengthening and balancing proximal LE muscles, stretching, and gait stability  Rationale for Evaluation and Treatment: Rehabilitation  THERAPY DIAG:  Pain in left hip  Difficulty in walking, not elsewhere classified  Muscle weakness (generalized)  ONSET DATE: THA 09/25/23   SUBJECTIVE:                                                                                                                                                                                           SUBJECTIVE STATEMENT: Pt reports she had some ache in Lt hip the evening of last session, but didn't need to take any medication.     Initial Subjective Subluxed with spontaneous reduction twice since surgery- once was while bending to dry leg after shower, the other was in yoga during rag doll  pose. I have major HS problems- Rt side really tight and proximal tendinosis. Sit on donut or limited to 10-15 min. Was at integrative therapies- focused more on keeping things loose but not a lot of strengthening. Up until about a month ago I was doing yoga 2/week, PT 2/week. 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   PERTINENT HISTORY:  Per MD note: multiple Lt hip dislocations with spontaneous reduction since THA, about 6 wks ago incr pain with pain to ankle and intermittent Lt groin pain;  H/o L5-S1 laminectomy, fusions at L3-5. Was in PT for 1 year since THA.  Rt reverse TSA in June 2025.    PAIN:  Are you having pain? Yes: NPRS scale: 1-2/10 pretty good. Pain location: Lt glut in seated, lateral Lt ankle when I stand for about a minute Pain description: sharp/stabbing Aggravating factors: getting up at night to go to the bathroom, sitting, standing for about a minute Relieving factors: the more I am up and moving  PRECAUTIONS:  None  RED FLAGS: None   WEIGHT BEARING RESTRICTIONS:  No  FALLS:  Has patient fallen in last 6 months? No  LIVING ENVIRONMENT: Only stairs are to get into/out of the house  OCCUPATION:  Retired, surveyor, minerals  PLOF:  Independent  PATIENT GOALS:  Yoga, glass work, decrease pain    OBJECTIVE:  Note: Objective measures were completed at Evaluation unless otherwise noted.  DIAGNOSTIC FINDINGS:  Lt Hip MRI ordered, scheduled 10/23/24  PATIENT SURVEYS:  LEFS  Extreme difficulty/unable (0), Quite a bit of difficulty (1), Moderate difficulty (2), Little difficulty (3), No difficulty (4) Survey date:  12/18  Any of your usual work, housework or school activities 2  2. Usual hobbies, recreational or sporting activities 2  3. Getting into/out of the bath 1  4.  Walking between rooms 2  5. Putting on socks/shoes 2  6. Squatting  1  7. Lifting an object, like a bag of  groceries from the floor 2  8. Performing light activities around your home 2  9. Performing heavy activities around your home 1  10. Getting into/out of a car 3  11. Walking 2 blocks 1  12. Walking 1 mile 0  13. Going up/down 10 stairs (1 flight) 1  14. Standing for 1 hour 2  15.  sitting for 1 hour 1  16. Running on even ground 0  17. Running on uneven ground 0  18. Making sharp turns while running fast 0  19. Hopping  0  20. Rolling over in bed 2  Score total:  25     COGNITIVE STATUS: Within functional limits for tasks assessed   SENSATION: Eval: numbness on left lateral thigh  EDEMA:  No  POSTURE:  Eval: wearing heel lift in left shoe Thoracic levoscoliosis with Rt shoulder drop Tends to sit with Lt leg crossed over Rt    GAIT: Comments: bilateral scissoring, Lt trendelenburg   Body Part #1 Hip   LOWER EXTREMITY ROM:     Eval: demo full available ROM through LEs   LOWER EXTREMITY MMT:     EVAL: gross MMT 4/5 in straight plane testing, is able to demo glut med activation in Lt SLS while using UE support and required cuing, unable to hold                                                                                                            TREATMENT DATE:  [facility] Adult PT Treatment:                                                DATE: 10/18/24 Pt seen for aquatic therapy today.  Treatment took place in water  3.5-4.75 ft in depth at the [facility] pool. Temp of water  was 91.  Pt entered/exited the pool via stairs suing step to pattern with hand rail.  *walking forward/  backward with cues for even step length, slight increase in step width, and to reduce speed -> with reciprocal arm swing with light resistance bells  * side stepping R/L  * suitcase carry with bil rainbow/ single yellow hand float under water  at side, marching backward/ forwards  *UE On rainbow hand floats:  3 way LE kick x10 each; hip circles x 5 each direction, each LE;  single  leg supermans x 5 each LE (very challenging on LLE) * STS from bench in water , with slow eccentric lowering to start position, feet on blue step x 10 * backward noodle scoots sitting on yellow noodle - cues for 90 deg knee flexion * straddling noodle, UE on hand floats: gentle cycling .     PATIENT EDUCATION:  Education details: exercise form/rationale Person educated: Patient Education method: Explanation,  Demonstration, Tactile cues, and Verbal cues Education comprehension: verbalized understanding, returned demonstration, verbal cues required, tactile cues required, and needs further education  HOME EXERCISE PROGRAM: Heel strike + glut set to decrease scissoring, SLS with hip hike   ASSESSMENT:  CLINICAL IMPRESSION: Pt arrived with low pain level.  She reported increase in Lt hip pain around 3-4/10 during session; eased to 1/10 with suspended gentle cycling.  Will continue with left hip abductor and extension strength and normalizing gait. Will continue to progress as tolerated. Goals are ongoing.     Initial Impression Patient is a 73 y.o. F who was seen today for physical therapy evaluation and treatment for weakness and pain in Lt hip and leg 1 year status post THA. Pt did have 2 incidences of subluxation posteriorly but has not had it happen again. Poor CKC activation of Lt hip with scissoring resulting in biomechanical chain instability, overuse of TFL and ITB, and impingement of lateral ankle. Pt will benefit from skilled PT to address deficits and improve ambulation tolerance. Aquatic PT will be helpful to improve proper activation through upright support of the water  and safety. Pt will then transition to land-based PT for further functional training.  Discussed upcoming MRI will be important to rule out tearing of hip abductor group.      REHAB POTENTIAL: Good  CLINICAL DECISION MAKING: Stable/uncomplicated  EVALUATION COMPLEXITY: Low   GOALS: Goals reviewed with  patient? Yes  SHORT TERM GOALS: Target date: 11/05/24  Able to demo heel strike consistently in ambulation Baseline: Goal status: INITIAL  2.  Demo step up in pool with level pelvis Baseline:  Goal status: INITIAL  3.  Prepared for transition to land-based rehab after 4 weeks of aquatics Baseline: may need to extend goal date depending on scheduling Goal status: INITIAL    LONG TERM GOALS: Target date: POC date  Ambulation without scissoring Baseline:  Goal status: INITIAL  2.  Able to stand for at least 10 min pain <=3/10 Baseline:  Goal status: INITIAL  3.  LEFS to improve by MDC Baseline:  Goal status: INITIAL  4.  Return to yoga without limitation by hip pain Baseline:  Goal status: INITIAL    PLAN:  PT FREQUENCY: 1-2x/week  PT DURATION: POC date  PLANNED INTERVENTIONS: 97164- PT Re-evaluation, 97750- Physical Performance Testing, 97110-Therapeutic exercises, 97530- Therapeutic activity, 97112- Neuromuscular re-education, 97535- Self Care, 02859- Manual therapy, [phone]- Gait training, [phone]- Aquatic Therapy, [phone] (1-2 muscles), 20561 (3+ muscles)- Dry Needling, Patient/Family education, Balance training, Stair training, Taping, Joint mobilization, Spinal mobilization, Scar mobilization, Cryotherapy, and Moist heat.  PLAN FOR NEXT SESSION: continue aquatic therapy  Delon Aquas, PTA 10/18/2024 3:36 PM [facility] [address] [place], [place], [address] Phone: [phone]   Fax:  [phone]  "

## 2024-10-21 ENCOUNTER — Encounter (HOSPITAL_BASED_OUTPATIENT_CLINIC_OR_DEPARTMENT_OTHER): Payer: Self-pay | Admitting: Physical Therapy

## 2024-10-21 ENCOUNTER — Ambulatory Visit (HOSPITAL_BASED_OUTPATIENT_CLINIC_OR_DEPARTMENT_OTHER): Payer: Self-pay | Attending: Physical Medicine and Rehabilitation | Admitting: Physical Therapy

## 2024-10-21 DIAGNOSIS — M6281 Muscle weakness (generalized): Secondary | ICD-10-CM | POA: Diagnosis present

## 2024-10-21 DIAGNOSIS — M25552 Pain in left hip: Secondary | ICD-10-CM | POA: Diagnosis present

## 2024-10-21 DIAGNOSIS — R262 Difficulty in walking, not elsewhere classified: Secondary | ICD-10-CM | POA: Diagnosis present

## 2024-10-21 NOTE — Therapy (Signed)
 " OUTPATIENT PHYSICAL THERAPY TREATMENT   Patient Name: Regina Kim MRN: [id number] DOB:06/28/1951, 74 y.o., female Today's Date: 10/21/2024  END OF SESSION:  PT End of Session - 10/21/24 1316     Visit Number 5    Number of Visits 20    Date for Recertification  12/17/24    Authorization Type MCR    Progress Note Due on Visit 10    PT Start Time 1312   pt arrived late   PT Stop Time 1345    PT Time Calculation (min) 33 min    Activity Tolerance Patient tolerated treatment well    Behavior During Therapy WFL for tasks assessed/performed            Past Medical History:  Diagnosis Date   Allergy    Angina    history of angina- none in 5 yrs   Arthritis    Asthma    years ago & related to GERD   Cataract    growing now   Complication of anesthesia 30 yrs ago   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   Diabetes mellitus    dx'd 4 yrs ago-NIDDM   GERD (gastroesophageal reflux disease)    Hyperlipidemia    controlled on medicines    Myocardial infarction (HCC) 2005   no blockage found in heart cath   PONV (postoperative nausea and vomiting)    SUI (stress urinary incontinence, female) 05/08/2011   Urine protein increased    UTI (urinary tract infection)    Past Surgical History:  Procedure Laterality Date   ABDOMINAL HYSTERECTOMY     ANTERIOR LAT LUMBAR FUSION Left 07/14/2019   Procedure: Left Lumbar two-three Anterolateral decompression/lateral plate fixation;  Surgeon: Colon Shove, MD;  Location: [facility];  Service: Neurosurgery;  Laterality: Left;   BACK SURGERY     BLADDER SURGERY  05/08/2011   Bladder Sling   BLADDER SURGERY  05/08/2011   Bladder Sling   BLADDER SUSPENSION  05/08/2011   Procedure: TRANSVAGINAL TAPE (TVT) PROCEDURE;  Surgeon: Lynwood FORBES Clubs II;  Location: [facility];  Service: Gynecology;  Laterality: N/A;  Transobturator Tape With Cystoscopy   CARDIAC CATHETERIZATION     COLONOSCOPY     CYSTOSCOPY     left total hip  replacment Left 09/25/2023   POLYPECTOMY     TOTAL HIP ARTHROPLASTY  03/31/2012   Procedure: TOTAL HIP ARTHROPLASTY;  Surgeon: Dempsey JINNY Sensor, MD;  Location: [facility];  Service: Orthopedics;  Laterality: Right;   Patient Active Problem List   Diagnosis Date Noted   S/P total left hip arthroplasty 10/03/2024   S/P reverse total shoulder arthroplasty, right 10/03/2024   Chronic pain syndrome 10/03/2024   It band syndrome, left 10/03/2024   Weakness of left hip 10/03/2024   Hamstring tightness of both lower extremities 10/03/2024   Chronic pain of left ankle 10/03/2024   Sensorineural hearing loss, bilateral 11/11/2023   Lumbar stenosis with neurogenic claudication 07/14/2019   Avascular necrosis of hip (HCC) 04/02/2012   SUI (stress urinary incontinence, female) 05/08/2011     REFERRING PROVIDER:  Emeline Joesph BROCKS, DO      REFERRING DIAG:  [phone] (ICD-10-CM) - S/P total left hip arthroplasty  R29.898 (ICD-10-CM) - Weakness of left hip  M62.9 (ICD-10-CM) - Hamstring tightness of both lower extremities  Evaluate and treat. Aquatherapy for left hip stabilizer weakness s/p posterior THA, and bilaterla hamstring tightness. Work on strengthening and balancing proximal LE muscles, stretching,  and gait stability   Rationale for Evaluation and Treatment: Rehabilitation  THERAPY DIAG:  Pain in left hip  Difficulty in walking, not elsewhere classified  Muscle weakness (generalized)  ONSET DATE: THA 09/25/23   SUBJECTIVE:                                                                                                                                                                                           SUBJECTIVE STATEMENT: I feel like I've turned a corner.   The more I'm up and moving, the better it (hip) seems.    Pt reports good tolerance to last session; no increase in pain at night.      Initial Subjective Subluxed with spontaneous reduction twice since surgery- once was  while bending to dry leg after shower, the other was in yoga during rag doll pose. I have major HS problems- Rt side really tight and proximal tendinosis. Sit on donut or limited to 10-15 min. Was at integrative therapies- focused more on keeping things loose but not a lot of strengthening. Up until about a month ago I was doing yoga 2/week, PT 2/week. 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   PERTINENT HISTORY:  Per MD note: multiple Lt hip dislocations with spontaneous reduction since THA, about 6 wks ago incr pain with pain to ankle and intermittent Lt groin pain;  H/o L5-S1 laminectomy, fusions at L3-5. Was in PT for 1 year since THA.  Rt reverse TSA in June 2025.    PAIN:  Are you having pain? Yes: NPRS scale: 1/10 Pain location: Lt glut in seated, lateral Lt ankle  Pain description: sharp/stabbing Aggravating factors: getting up at night to go to the bathroom, sitting, standing for about a minute Relieving factors: the more I am up and moving  PRECAUTIONS:  None  RED FLAGS: None   WEIGHT BEARING RESTRICTIONS:  No  FALLS:  Has patient fallen in last 6 months? No  LIVING ENVIRONMENT: Only stairs are to get into/out of the house  OCCUPATION:  Retired, surveyor, minerals  PLOF:  Independent  PATIENT GOALS:  Yoga, glass work, decrease pain    OBJECTIVE:  Note: Objective measures were completed at Evaluation unless otherwise noted.  DIAGNOSTIC FINDINGS:  Lt Hip MRI ordered, scheduled 10/23/24  PATIENT SURVEYS:  LEFS  Extreme difficulty/unable (0), Quite a bit of difficulty (1), Moderate difficulty (2), Little difficulty (3), No difficulty (4) Survey date:  12/18  Any of your usual work, housework or school activities 2  2. Usual hobbies, recreational or  sporting activities 2  3. Getting into/out of the bath 1  4. Walking between rooms 2  5. Putting on socks/shoes 2  6. Squatting  1  7. Lifting an  object, like a bag of groceries from the floor 2  8. Performing light activities around your home 2  9. Performing heavy activities around your home 1  10. Getting into/out of a car 3  11. Walking 2 blocks 1  12. Walking 1 mile 0  13. Going up/down 10 stairs (1 flight) 1  14. Standing for 1 hour 2  15.  sitting for 1 hour 1  16. Running on even ground 0  17. Running on uneven ground 0  18. Making sharp turns while running fast 0  19. Hopping  0  20. Rolling over in bed 2  Score total:  25     COGNITIVE STATUS: Within functional limits for tasks assessed   SENSATION: Eval: numbness on left lateral thigh  EDEMA:  No  POSTURE:  Eval: wearing heel lift in left shoe Thoracic levoscoliosis with Rt shoulder drop Tends to sit with Lt leg crossed over Rt    GAIT: Comments: bilateral scissoring, Lt trendelenburg   Body Part #1 Hip   LOWER EXTREMITY ROM:     Eval: demo full available ROM through LEs   LOWER EXTREMITY MMT:     EVAL: gross MMT 4/5 in straight plane testing, is able to demo glut med activation in Lt SLS while using UE support and required cuing, unable to hold                                                                                                            TREATMENT DATE:  [facility] Adult PT Treatment:                                                DATE: 10/21/24 Pt seen for aquatic therapy today.  Treatment took place in water  3.5-4.75 ft in depth at the [facility] pool. Temp of water  was 91.  Pt entered/exited the pool via stairs suing step to pattern with hand rail.  *walking forward/  backward with cues for even step length,  * side stepping R/L with arm add/abdct with rainbow hand floats -> into wide squat  * forward walking kick *UE On rainbow hand floats:  3 way LE kick 2x5 each (some irritation with Lt hip extension) * UE on wall:  split squats 2x 5 each LE forward  (some pain in Lt ankle upon completion) * runners step ups with light  UE support on rails x 10 each LE (pain in Lt hip up to 3/10) * SLS hollow blue noodle stomp x 10 each LE, intermittent UE to steady * backward noodle scoots sitting on yellow noodle - cues for 90 deg knee flexion * straddling noodle, UE on hand floats: gentle cycling  (pain reduced, no  pain in ankle)   PATIENT EDUCATION:  Education details: exercise form/rationale Person educated: Patient Education method: Explanation, Demonstration, Tactile cues, and Verbal cues Education comprehension: verbalized understanding, returned demonstration, verbal cues required, tactile cues required, and needs further education  HOME EXERCISE PROGRAM: Heel strike + glut set to decrease scissoring, SLS with hip hike   ASSESSMENT:  CLINICAL IMPRESSION: Positive response to aquatic therapy thus far.  Pain remained low during session, only up to 3/10 with hip extension.  Pain eased to 1/10 with suspended gentle cycling. Pt able to gain balance while straddling noodle, with UE on hand floats. Session shortened due to pt's late arrival.  Will continue with left hip abductor and extension strength and normalizing gait. Will continue to progress as tolerated. Goals are ongoing.     Initial Impression Patient is a 74 y.o. F who was seen today for physical therapy evaluation and treatment for weakness and pain in Lt hip and leg 1 year status post THA. Pt did have 2 incidences of subluxation posteriorly but has not had it happen again. Poor CKC activation of Lt hip with scissoring resulting in biomechanical chain instability, overuse of TFL and ITB, and impingement of lateral ankle. Pt will benefit from skilled PT to address deficits and improve ambulation tolerance. Aquatic PT will be helpful to improve proper activation through upright support of the water  and safety. Pt will then transition to land-based PT for further functional training.  Discussed upcoming MRI will be important to rule out tearing of hip abductor  group.      REHAB POTENTIAL: Good  CLINICAL DECISION MAKING: Stable/uncomplicated  EVALUATION COMPLEXITY: Low   GOALS: Goals reviewed with patient? Yes  SHORT TERM GOALS: Target date: 11/05/24  Able to demo heel strike consistently in ambulation Baseline: Goal status: INITIAL  2.  Demo step up in pool with level pelvis Baseline:  Goal status: INITIAL  3.  Prepared for transition to land-based rehab after 4 weeks of aquatics Baseline: may need to extend goal date depending on scheduling Goal status: INITIAL    LONG TERM GOALS: Target date: POC date  Ambulation without scissoring Baseline:  Goal status: INITIAL  2.  Able to stand for at least 10 min pain <=3/10 Baseline:  Goal status: INITIAL  3.  LEFS to improve by MDC Baseline:  Goal status: INITIAL  4.  Return to yoga without limitation by hip pain Baseline:  Goal status: INITIAL    PLAN:  PT FREQUENCY: 1-2x/week  PT DURATION: POC date  PLANNED INTERVENTIONS: 97164- PT Re-evaluation, 97750- Physical Performance Testing, 97110-Therapeutic exercises, 97530- Therapeutic activity, 97112- Neuromuscular re-education, 97535- Self Care, 02859- Manual therapy, [phone]- Gait training, [phone]- Aquatic Therapy, [phone] (1-2 muscles), [phone] (3+ muscles)- Dry Needling, Patient/Family education, Balance training, Stair training, Taping, Joint mobilization, Spinal mobilization, Scar mobilization, Cryotherapy, and Moist heat.  PLAN FOR NEXT SESSION: continue aquatic therapy  Delon Aquas, PTA 10/21/2024 2:40 PM [facility] [address] [place], [place], [address] Phone: [phone]   Fax:  [phone]   "

## 2024-10-23 ENCOUNTER — Ambulatory Visit
Admission: RE | Admit: 2024-10-23 | Discharge: 2024-10-23 | Disposition: A | Discharge status: Home / Self Care | Source: Ambulatory Visit | Attending: Physical Medicine and Rehabilitation | Admitting: Physical Medicine and Rehabilitation

## 2024-10-23 DIAGNOSIS — Z96642 Presence of left artificial hip joint: Secondary | ICD-10-CM

## 2024-10-31 ENCOUNTER — Encounter (HOSPITAL_BASED_OUTPATIENT_CLINIC_OR_DEPARTMENT_OTHER): Payer: Self-pay | Admitting: Physical Therapy

## 2024-10-31 ENCOUNTER — Ambulatory Visit (HOSPITAL_BASED_OUTPATIENT_CLINIC_OR_DEPARTMENT_OTHER): Payer: Self-pay | Admitting: Physical Therapy

## 2024-10-31 DIAGNOSIS — M25552 Pain in left hip: Secondary | ICD-10-CM | POA: Diagnosis not present

## 2024-10-31 DIAGNOSIS — M6281 Muscle weakness (generalized): Secondary | ICD-10-CM

## 2024-10-31 DIAGNOSIS — R262 Difficulty in walking, not elsewhere classified: Secondary | ICD-10-CM

## 2024-10-31 NOTE — Therapy (Signed)
 " OUTPATIENT PHYSICAL THERAPY TREATMENT   Patient Name: Regina Kim MRN: [id number] DOB:Apr 20, 1951, 74 y.o., female Today's Date: 10/31/2024  END OF SESSION:  PT End of Session - 10/31/24 1102     Visit Number 6    Number of Visits 20    Date for Recertification  12/17/24    Authorization Type MCR    Progress Note Due on Visit 10    PT Start Time 1100    PT Stop Time 1138    PT Time Calculation (min) 38 min    Activity Tolerance Patient tolerated treatment well    Behavior During Therapy WFL for tasks assessed/performed            Past Medical History:  Diagnosis Date   Allergy    Angina    history of angina- none in 5 yrs   Arthritis    Asthma    years ago & related to GERD   Cataract    growing now   Complication of anesthesia 30 yrs ago   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   Diabetes mellitus    dx'd 4 yrs ago-NIDDM   GERD (gastroesophageal reflux disease)    Hyperlipidemia    controlled on medicines    Myocardial infarction (HCC) 2005   no blockage found in heart cath   PONV (postoperative nausea and vomiting)    SUI (stress urinary incontinence, female) 05/08/2011   Urine protein increased    UTI (urinary tract infection)    Past Surgical History:  Procedure Laterality Date   ABDOMINAL HYSTERECTOMY     ANTERIOR LAT LUMBAR FUSION Left 07/14/2019   Procedure: Left Lumbar two-three Anterolateral decompression/lateral plate fixation;  Surgeon: Colon Shove, MD;  Location: [facility];  Service: Neurosurgery;  Laterality: Left;   BACK SURGERY     BLADDER SURGERY  05/08/2011   Bladder Sling   BLADDER SURGERY  05/08/2011   Bladder Sling   BLADDER SUSPENSION  05/08/2011   Procedure: TRANSVAGINAL TAPE (TVT) PROCEDURE;  Surgeon: Lynwood FORBES Clubs II;  Location: [facility];  Service: Gynecology;  Laterality: N/A;  Transobturator Tape With Cystoscopy   CARDIAC CATHETERIZATION     COLONOSCOPY     CYSTOSCOPY     left total hip replacment Left  09/25/2023   POLYPECTOMY     TOTAL HIP ARTHROPLASTY  03/31/2012   Procedure: TOTAL HIP ARTHROPLASTY;  Surgeon: Dempsey JINNY Sensor, MD;  Location: [facility];  Service: Orthopedics;  Laterality: Right;   Patient Active Problem List   Diagnosis Date Noted   S/P total left hip arthroplasty 10/03/2024   S/P reverse total shoulder arthroplasty, right 10/03/2024   Chronic pain syndrome 10/03/2024   It band syndrome, left 10/03/2024   Weakness of left hip 10/03/2024   Hamstring tightness of both lower extremities 10/03/2024   Chronic pain of left ankle 10/03/2024   Sensorineural hearing loss, bilateral 11/11/2023   Lumbar stenosis with neurogenic claudication 07/14/2019   Avascular necrosis of hip (HCC) 04/02/2012   SUI (stress urinary incontinence, female) 05/08/2011     REFERRING PROVIDER:  Emeline Joesph BROCKS, DO      REFERRING DIAG:  [phone] (ICD-10-CM) - S/P total left hip arthroplasty  R29.898 (ICD-10-CM) - Weakness of left hip  M62.9 (ICD-10-CM) - Hamstring tightness of both lower extremities  Evaluate and treat. Aquatherapy for left hip stabilizer weakness s/p posterior THA, and bilaterla hamstring tightness. Work on strengthening and balancing proximal LE muscles, stretching, and gait stability  Rationale for Evaluation and Treatment: Rehabilitation  THERAPY DIAG:  Pain in left hip  Difficulty in walking, not elsewhere classified  Muscle weakness (generalized)  ONSET DATE: THA 09/25/23   SUBJECTIVE:                                                                                                                                                                                           SUBJECTIVE STATEMENT: Pt returned to chair yoga 2x last week.  She reports she has more Lt groin pain to the touch.  She had MRI 1 wk ago; no results yet.  My hip is better, my ankle is the same.       Initial Subjective Subluxed with spontaneous reduction twice since surgery- once was while  bending to dry leg after shower, the other was in yoga during rag doll pose. I have major HS problems- Rt side really tight and proximal tendinosis. Sit on donut or limited to 10-15 min. Was at integrative therapies- focused more on keeping things loose but not a lot of strengthening. Up until about a month ago I was doing yoga 2/week, PT 2/week. 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   PERTINENT HISTORY:  Per MD note: multiple Lt hip dislocations with spontaneous reduction since THA, about 6 wks ago incr pain with pain to ankle and intermittent Lt groin pain;  H/o L5-S1 laminectomy, fusions at L3-5. Was in PT for 1 year since THA.  Rt reverse TSA in June 2025.    PAIN:  Are you having pain? Yes: NPRS scale: 3/10 Pain location:  lateral Lt ankle  Pain description: sharp/stabbing Aggravating factors: getting up at night to go to the bathroom, sitting, standing for about a minute Relieving factors: the more I am up and moving  PRECAUTIONS:  None  RED FLAGS: None   WEIGHT BEARING RESTRICTIONS:  No  FALLS:  Has patient fallen in last 6 months? No  LIVING ENVIRONMENT: Only stairs are to get into/out of the house  OCCUPATION:  Retired, surveyor, minerals  PLOF:  Independent  PATIENT GOALS:  Yoga, glass work, decrease pain    OBJECTIVE:  Note: Objective measures were completed at Evaluation unless otherwise noted.  DIAGNOSTIC FINDINGS:  Lt Hip MRI ordered, scheduled 10/23/24  PATIENT SURVEYS:  LEFS  Extreme difficulty/unable (0), Quite a bit of difficulty (1), Moderate difficulty (2), Little difficulty (3), No difficulty (4) Survey date:  12/18 10/31/24  Any of your usual work, housework or school activities 2 2  2. Usual hobbies, recreational or sporting  activities 2 3  3. Getting into/out of the bath 1 1  4. Walking between rooms 2 2  5. Putting on socks/shoes 2 4  6. Squatting  1 1  7. Lifting an  object, like a bag of groceries from the floor 2 3  8. Performing light activities around your home 2 2  9. Performing heavy activities around your home 1 1  10. Getting into/out of a car 3 4  11. Walking 2 blocks 1 2  12. Walking 1 mile 0 1  13. Going up/down 10 stairs (1 flight) 1 2  14. Standing for 1 hour 2 1  15.  sitting for 1 hour 1 1  16. Running on even ground 0 1  17. Running on uneven ground 0 1  18. Making sharp turns while running fast 0 1  19. Hopping  0 1  20. Rolling over in bed 2 3  Score total:  25 37     COGNITIVE STATUS: Within functional limits for tasks assessed   SENSATION: Eval: numbness on left lateral thigh  EDEMA:  No  POSTURE:  Eval: wearing heel lift in left shoe Thoracic levoscoliosis with Rt shoulder drop Tends to sit with Lt leg crossed over Rt    GAIT: Comments: bilateral scissoring, Lt trendelenburg   Body Part #1 Hip   LOWER EXTREMITY ROM:     Eval: demo full available ROM through LEs   LOWER EXTREMITY MMT:     EVAL: gross MMT 4/5 in straight plane testing, is able to demo glut med activation in Lt SLS while using UE support and required cuing, unable to hold                                                                                                            TREATMENT DATE:  [facility] Adult PT Treatment:                                                DATE: 10/31/24 Pt seen for aquatic therapy today.  Treatment took place in water  3.5-4.75 ft in depth at the [facility] pool. Temp of water  was 91.  Pt entered/exited the pool via stairs with hand rail.  *walking forward/  backward with cues for even step length,  * side stepping R/L with arm add/abdct with rainbow hand floats  * forward walking kick *UE On rainbow hand floats:  3 way LE kick x 10 each (some discomfort with Lt hip extension) * UE on wall:  split squats x10 each LE forward  (no increased pain in Lt ankle) * forward walking partial split squats with UE  on yellow hand floats * UE On wall:   alternating single leg clams x 8 each * SLS hollow blue noodle stomp x 10 each LE, repeated in hip abdct x 10 each LE * straddling noodle, UE on hand floats: gentle cycling  (  pain reduced, no pain in ankle)   PATIENT EDUCATION:  Education details: exercise form/rationale Person educated: Patient Education method: Explanation, Demonstration, Tactile cues, and Verbal cues Education comprehension: verbalized understanding, returned demonstration, verbal cues required, tactile cues required, and needs further education  HOME EXERCISE PROGRAM: Heel strike + glut set to decrease scissoring, SLS with hip hike   ASSESSMENT:  CLINICAL IMPRESSION: LEFS improved by 12 points.  Pt demonstrated improved balance in SLS exercises in water .  She continues to have increased Lt hip pain with hip extension.   Pain remained low during session, only up to 2-3/10.  No increase in Lt ankle pain with split squats today.  Pain eased with suspended gentle cycling.  Will continue with left hip abductor and extension strength and normalizing gait. Will continue to progress as tolerated. Therapist to check goals next session as time allows.     Initial Impression Patient is a 74 y.o. F who was seen today for physical therapy evaluation and treatment for weakness and pain in Lt hip and leg 1 year status post THA. Pt did have 2 incidences of subluxation posteriorly but has not had it happen again. Poor CKC activation of Lt hip with scissoring resulting in biomechanical chain instability, overuse of TFL and ITB, and impingement of lateral ankle. Pt will benefit from skilled PT to address deficits and improve ambulation tolerance. Aquatic PT will be helpful to improve proper activation through upright support of the water  and safety. Pt will then transition to land-based PT for further functional training.  Discussed upcoming MRI will be important to rule out tearing of hip abductor  group.      REHAB POTENTIAL: Good  CLINICAL DECISION MAKING: Stable/uncomplicated  EVALUATION COMPLEXITY: Low   GOALS: Goals reviewed with patient? Yes  SHORT TERM GOALS: Target date: 11/05/24  Able to demo heel strike consistently in ambulation Baseline: Goal status: INITIAL  2.  Demo step up in pool with level pelvis Baseline:  Goal status: INITIAL  3.  Prepared for transition to land-based rehab after 4 weeks of aquatics Baseline: may need to extend goal date depending on scheduling Goal status: INITIAL    LONG TERM GOALS: Target date: POC date  Ambulation without scissoring Baseline:  Goal status: INITIAL  2.  Able to stand for at least 10 min pain <=3/10 Baseline:  Goal status: INITIAL  3.  LEFS to improve by MDC Baseline:  Goal status: INITIAL  4.  Return to yoga without limitation by hip pain Baseline:  Goal status: In progress -10/31/24    PLAN:  PT FREQUENCY: 1-2x/week  PT DURATION: POC date  PLANNED INTERVENTIONS: 97164- PT Re-evaluation, 97750- Physical Performance Testing, 97110-Therapeutic exercises, 97530- Therapeutic activity, 97112- Neuromuscular re-education, 97535- Self Care, 02859- Manual therapy, [phone]- Gait training, [phone]- Aquatic Therapy, [phone] (1-2 muscles), 20561 (3+ muscles)- Dry Needling, Patient/Family education, Balance training, Stair training, Taping, Joint mobilization, Spinal mobilization, Scar mobilization, Cryotherapy, and Moist heat.  PLAN FOR NEXT SESSION: continue aquatic therapy  Delon Aquas, PTA 10/31/2024 11:38 AM [facility] [address] [place], [place], [address] Phone: [phone]   Fax:  [phone]  "

## 2024-11-03 ENCOUNTER — Encounter (HOSPITAL_BASED_OUTPATIENT_CLINIC_OR_DEPARTMENT_OTHER): Payer: Self-pay | Admitting: Physical Therapy

## 2024-11-03 ENCOUNTER — Ambulatory Visit (HOSPITAL_BASED_OUTPATIENT_CLINIC_OR_DEPARTMENT_OTHER): Admitting: Physical Therapy

## 2024-11-03 DIAGNOSIS — M6281 Muscle weakness (generalized): Secondary | ICD-10-CM

## 2024-11-03 DIAGNOSIS — M25552 Pain in left hip: Secondary | ICD-10-CM

## 2024-11-03 DIAGNOSIS — R262 Difficulty in walking, not elsewhere classified: Secondary | ICD-10-CM

## 2024-11-03 NOTE — Therapy (Signed)
 " OUTPATIENT PHYSICAL THERAPY TREATMENT   Patient Name: Regina Kim MRN: [id number] DOB:01-31-1951, 74 y.o., female Today's Date: 11/03/2024  END OF SESSION:  PT End of Session - 11/03/24 1014     Visit Number 7    Number of Visits 20    Date for Recertification  12/17/24    Authorization Type MCR    Progress Note Due on Visit 10    PT Start Time 0930    PT Stop Time 1010    PT Time Calculation (min) 40 min    Activity Tolerance Patient tolerated treatment well    Behavior During Therapy WFL for tasks assessed/performed             Past Medical History:  Diagnosis Date   Allergy    Angina    history of angina- none in 5 yrs   Arthritis    Asthma    years ago & related to GERD   Cataract    growing now   Complication of anesthesia 30 yrs ago   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   Diabetes mellitus    dx'd 4 yrs ago-NIDDM   GERD (gastroesophageal reflux disease)    Hyperlipidemia    controlled on medicines    Myocardial infarction (HCC) 2005   no blockage found in heart cath   PONV (postoperative nausea and vomiting)    SUI (stress urinary incontinence, female) 05/08/2011   Urine protein increased    UTI (urinary tract infection)    Past Surgical History:  Procedure Laterality Date   ABDOMINAL HYSTERECTOMY     ANTERIOR LAT LUMBAR FUSION Left 07/14/2019   Procedure: Left Lumbar two-three Anterolateral decompression/lateral plate fixation;  Surgeon: Colon Shove, MD;  Location: [facility];  Service: Neurosurgery;  Laterality: Left;   BACK SURGERY     BLADDER SURGERY  05/08/2011   Bladder Sling   BLADDER SURGERY  05/08/2011   Bladder Sling   BLADDER SUSPENSION  05/08/2011   Procedure: TRANSVAGINAL TAPE (TVT) PROCEDURE;  Surgeon: Lynwood FORBES Clubs II;  Location: [facility];  Service: Gynecology;  Laterality: N/A;  Transobturator Tape With Cystoscopy   CARDIAC CATHETERIZATION     COLONOSCOPY     CYSTOSCOPY     left total hip replacment Left  09/25/2023   POLYPECTOMY     TOTAL HIP ARTHROPLASTY  03/31/2012   Procedure: TOTAL HIP ARTHROPLASTY;  Surgeon: Dempsey JINNY Sensor, MD;  Location: [facility] OR;  Service: Orthopedics;  Laterality: Right;   Patient Active Problem List   Diagnosis Date Noted   S/P total left hip arthroplasty 10/03/2024   S/P reverse total shoulder arthroplasty, right 10/03/2024   Chronic pain syndrome 10/03/2024   It band syndrome, left 10/03/2024   Weakness of left hip 10/03/2024   Hamstring tightness of both lower extremities 10/03/2024   Chronic pain of left ankle 10/03/2024   Sensorineural hearing loss, bilateral 11/11/2023   Lumbar stenosis with neurogenic claudication 07/14/2019   Avascular necrosis of hip (HCC) 04/02/2012   SUI (stress urinary incontinence, female) 05/08/2011     REFERRING PROVIDER:  Emeline Joesph BROCKS, DO      REFERRING DIAG:  [phone] (ICD-10-CM) - S/P total left hip arthroplasty  R29.898 (ICD-10-CM) - Weakness of left hip  M62.9 (ICD-10-CM) - Hamstring tightness of both lower extremities  Evaluate and treat. Aquatherapy for left hip stabilizer weakness s/p posterior THA, and bilaterla hamstring tightness. Work on strengthening and balancing proximal LE muscles, stretching, and gait stability  Rationale for Evaluation and Treatment: Rehabilitation  THERAPY DIAG:  Pain in left hip  Difficulty in walking, not elsewhere classified  Muscle weakness (generalized)  ONSET DATE: THA 09/25/23   SUBJECTIVE:                                                                                                                                                                                           SUBJECTIVE STATEMENT: I feel like I'm getting stronger and my pain is getting less.  Pt reports she has noticed her pain is worse if she sits too long, I need to find ways to get moving more often.      Initial Subjective Subluxed with spontaneous reduction twice since surgery- once was while  bending to dry leg after shower, the other was in yoga during rag doll pose. I have major HS problems- Rt side really tight and proximal tendinosis. Sit on donut or limited to 10-15 min. Was at integrative therapies- focused more on keeping things loose but not a lot of strengthening. Up until about a month ago I was doing yoga 2/week, PT 2/week. 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   PERTINENT HISTORY:  Per MD note: multiple Lt hip dislocations with spontaneous reduction since THA, about 6 wks ago incr pain with pain to ankle and intermittent Lt groin pain;  H/o L5-S1 laminectomy, fusions at L3-5. Was in PT for 1 year since THA.  Rt reverse TSA in June 2025.    PAIN:  Are you having pain? Yes: NPRS scale: 0.5/10 Pain location:  lateral Lt ankle  Pain description: sharp/stabbing Aggravating factors: standing still  Relieving factors: the more I am up and moving  PRECAUTIONS:  None  RED FLAGS: None   WEIGHT BEARING RESTRICTIONS:  No  FALLS:  Has patient fallen in last 6 months? No  LIVING ENVIRONMENT: Only stairs are to get into/out of the house  OCCUPATION:  Retired, surveyor, minerals  PLOF:  Independent  PATIENT GOALS:  Yoga, glass work, decrease pain    OBJECTIVE:  Note: Objective measures were completed at Evaluation unless otherwise noted.  DIAGNOSTIC FINDINGS:  Lt Hip MRI ordered, scheduled 10/23/24  PATIENT SURVEYS:  LEFS  Extreme difficulty/unable (0), Quite a bit of difficulty (1), Moderate difficulty (2), Little difficulty (3), No difficulty (4) Survey date:  12/18 10/31/24  Any of your usual work, housework or school activities 2 2  2. Usual hobbies, recreational or sporting activities 2 3  3. Getting into/out of the bath 1 1  4. Walking between  rooms 2 2  5. Putting on socks/shoes 2 4  6. Squatting  1 1  7. Lifting an object, like a bag of groceries from the floor 2 3  8. Performing  light activities around your home 2 2  9. Performing heavy activities around your home 1 1  10. Getting into/out of a car 3 4  11. Walking 2 blocks 1 2  12. Walking 1 mile 0 1  13. Going up/down 10 stairs (1 flight) 1 2  14. Standing for 1 hour 2 1  15.  sitting for 1 hour 1 1  16. Running on even ground 0 1  17. Running on uneven ground 0 1  18. Making sharp turns while running fast 0 1  19. Hopping  0 1  20. Rolling over in bed 2 3  Score total:  25 37     COGNITIVE STATUS: Within functional limits for tasks assessed   SENSATION: Eval: numbness on left lateral thigh  EDEMA:  No  POSTURE:  Eval: wearing heel lift in left shoe Thoracic levoscoliosis with Rt shoulder drop Tends to sit with Lt leg crossed over Rt    GAIT: Comments: bilateral scissoring, Lt trendelenburg   Body Part #1 Hip   LOWER EXTREMITY ROM:     Eval: demo full available ROM through LEs   LOWER EXTREMITY MMT:     EVAL: gross MMT 4/5 in straight plane testing, is able to demo glut med activation in Lt SLS while using UE support and required cuing, unable to hold                                                                                                            TREATMENT DATE:  [facility] Adult PT Treatment:                                                DATE: 11/03/24 Pt seen for aquatic therapy today.  Treatment took place in water  3.5-4.75 ft in depth at the [facility] pool. Temp of water  was 91.  Pt entered/exited the pool via stairs with hand rail.  *walking forward/  backward with cues for even step length,  * side stepping R/L with arm add/abdct with rainbow hand floats  * forward walking partial split squats with UE on yellow hand floats *UE On rainbow hand floats:  3 way LE kick x 10 each (some discomfort with Lt hip extension); heel/toe raises x 10 * forward walking kick * SLS solid blue noodle stomp x 10 slow, 10 fast each LE, repeated in hip abdct x 10 each LE * UE On  wall: foot on solid noodle, ant hip circles (challenge with RLE); hip abdct/ add crossing midline x 10; alternating single leg clams x 8 each * standing balance on yellow noodle -> marching x 4 (LOB when in R SLS), mini squats x 10 (good challenge) *  straddling noodle, UE on hand floats: gentle cycling    PATIENT EDUCATION:  Education details: exercise form/rationale Person educated: Patient Education method: Explanation, Demonstration, Tactile cues, and Verbal cues Education comprehension: verbalized understanding, returned demonstration, verbal cues required, tactile cues required, and needs further education  HOME EXERCISE PROGRAM: Heel strike + glut set to decrease scissoring, SLS with hip hike   ASSESSMENT:  CLINICAL IMPRESSION: Pt reporting reduction of pain and improvement in functional strength.  Pt continues to demonstrate trendelenburg gait on land; improved in water . Only minor discomfort in Lt hip with hip extension; improved from previous sessions.   Pain remained low during session, 0.5/10 in ankle.  Therapist to check goals next session as time allows.     Initial Impression Patient is a 74 y.o. F who was seen today for physical therapy evaluation and treatment for weakness and pain in Lt hip and leg 1 year status post THA. Pt did have 2 incidences of subluxation posteriorly but has not had it happen again. Poor CKC activation of Lt hip with scissoring resulting in biomechanical chain instability, overuse of TFL and ITB, and impingement of lateral ankle. Pt will benefit from skilled PT to address deficits and improve ambulation tolerance. Aquatic PT will be helpful to improve proper activation through upright support of the water  and safety. Pt will then transition to land-based PT for further functional training.  Discussed upcoming MRI will be important to rule out tearing of hip abductor group.      REHAB POTENTIAL: Good  CLINICAL DECISION MAKING:  Stable/uncomplicated  EVALUATION COMPLEXITY: Low   GOALS: Goals reviewed with patient? Yes  SHORT TERM GOALS: Target date: 11/05/24  Able to demo heel strike consistently in ambulation Baseline: Goal status: INITIAL  2.  Demo step up in pool with level pelvis Baseline:  Goal status: INITIAL  3.  Prepared for transition to land-based rehab after 4 weeks of aquatics Baseline: may need to extend goal date depending on scheduling Goal status: In progress 11/03/24    LONG TERM GOALS: Target date: POC date  Ambulation without scissoring Baseline:  Goal status: INITIAL  2.  Able to stand for at least 10 min pain <=3/10 Baseline:  Goal status: INITIAL  3.  LEFS to improve by MDC Baseline:  Goal status: INITIAL  4.  Return to yoga without limitation by hip pain Baseline:  Goal status: In progress -10/31/24    PLAN:  PT FREQUENCY: 1-2x/week  PT DURATION: POC date  PLANNED INTERVENTIONS: 97164- PT Re-evaluation, 97750- Physical Performance Testing, 97110-Therapeutic exercises, 97530- Therapeutic activity, 97112- Neuromuscular re-education, 97535- Self Care, [id number]- Manual therapy, [phone]- Gait training, [phone]- Aquatic Therapy, [phone] (1-2 muscles), [id number] (3+ muscles)- Dry Needling, Patient/Family education, Balance training, Stair training, Taping, Joint mobilization, Spinal mobilization, Scar mobilization, Cryotherapy, and Moist heat.  PLAN FOR NEXT SESSION: continue aquatic therapy  Delon Aquas, PTA 11/03/24 10:14 AM [facility] [address] [place], [place], [address] Phone: [phone]   Fax:  [phone]  "

## 2024-11-04 ENCOUNTER — Ambulatory Visit: Payer: Self-pay | Admitting: Physical Medicine and Rehabilitation

## 2024-11-04 NOTE — Telephone Encounter (Signed)
 Called and discussed MRI results with patient. Forwarded results to [facility] ([facility] and Dr Liam) for review but reiterated plan for now is PT and conservative care. She finished aquatherapy at [facility] and is very pleased with her progress, is starting land based therapies next week.   Follow up as scheduled   Regina Kim Likes, DO 11/04/2024

## 2024-11-06 NOTE — Therapy (Signed)
 " OUTPATIENT PHYSICAL THERAPY TREATMENT   Patient Name: Regina Kim MRN: [id number] DOB:[birth year], 74 y.o., female Today's Date: 11/07/2024  END OF SESSION:  PT End of Session - 11/07/24 0813     Visit Number 8    Number of Visits 20    Date for Recertification  12/17/24    Authorization Type MCR    Progress Note Due on Visit 10    PT Start Time 0806    PT Stop Time 0851    PT Time Calculation (min) 45 min    Activity Tolerance Patient tolerated treatment well    Behavior During Therapy [facility] for tasks assessed/performed              Past Medical History:  Diagnosis Date   Allergy    Angina    history of angina- none in 5 yrs   Arthritis    Asthma    years ago & related to GERD   Cataract    growing now   Complication of anesthesia 30 yrs ago   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   Diabetes mellitus    dx'd 4 yrs ago-NIDDM   GERD (gastroesophageal reflux disease)    Hyperlipidemia    controlled on medicines    Myocardial infarction (HCC) 2005   no blockage found in heart cath   PONV (postoperative nausea and vomiting)    SUI (stress urinary incontinence, female) 05/08/2011   Urine protein increased    UTI (urinary tract infection)    Past Surgical History:  Procedure Laterality Date   ABDOMINAL HYSTERECTOMY     ANTERIOR LAT LUMBAR FUSION Left 07/14/2019   Procedure: Left Lumbar two-three Anterolateral decompression/lateral plate fixation;  Surgeon: Colon Shove, MD;  Location: [facility] OR;  Service: Neurosurgery;  Laterality: Left;   BACK SURGERY     BLADDER SURGERY  05/08/2011   Bladder Sling   BLADDER SURGERY  05/08/2011   Bladder Sling   BLADDER SUSPENSION  05/08/2011   Procedure: TRANSVAGINAL TAPE (TVT) PROCEDURE;  Surgeon: Lynwood FORBES Clubs II;  Location: [facility];  Service: Gynecology;  Laterality: N/A;  Transobturator Tape With Cystoscopy   CARDIAC CATHETERIZATION     COLONOSCOPY     CYSTOSCOPY     left total hip replacment Left  09/25/2023   POLYPECTOMY     TOTAL HIP ARTHROPLASTY  03/31/2012   Procedure: TOTAL HIP ARTHROPLASTY;  Surgeon: Dempsey JINNY Sensor, MD;  Location: [facility] OR;  Service: Orthopedics;  Laterality: Right;   Patient Active Problem List   Diagnosis Date Noted   S/P total left hip arthroplasty 10/03/2024   S/P reverse total shoulder arthroplasty, right 10/03/2024   Chronic pain syndrome 10/03/2024   It band syndrome, left 10/03/2024   Weakness of left hip 10/03/2024   Hamstring tightness of both lower extremities 10/03/2024   Chronic pain of left ankle 10/03/2024   Sensorineural hearing loss, bilateral 11/11/2023   Lumbar stenosis with neurogenic claudication 07/14/2019   Avascular necrosis of hip (HCC) 04/02/2012   SUI (stress urinary incontinence, female) 05/08/2011     REFERRING PROVIDER:  Emeline Joesph BROCKS, DO      REFERRING DIAG:  [phone] (ICD-10-CM) - S/P total left hip arthroplasty  R29.898 (ICD-10-CM) - Weakness of left hip  M62.9 (ICD-10-CM) - Hamstring tightness of both lower extremities  Evaluate and treat. Aquatherapy for left hip stabilizer weakness s/p posterior THA, and bilaterla hamstring tightness. Work on strengthening and balancing proximal LE muscles, stretching, and gait  stability   Rationale for Evaluation and Treatment: Rehabilitation  THERAPY DIAG:  Pain in left hip  Difficulty in walking, not elsewhere classified  Muscle weakness (generalized)  ONSET DATE: THA 09/25/23   SUBJECTIVE:                                                                                                                                                                                           SUBJECTIVE STATEMENT: Pt states the pool was good.  Things are better than what they were, but I still have a considerable amount of pain.  Pt reports walking increases her pain.  Pt states she would like to go for a walk, but she can't.  Pt states her results from MRI came back.  She was called and  informed that it looked like bursitis.  Pt denies any adverse effects after prior treatment.  Pt hasn't been performing the home exercise from land therapist due to performing the water  exercises.  She said she forgot about the land exercise.  Pt states she uses a cane primarily to not perform a scissoring gait.  Pt states she gets pain and tightness in ITB and her husband massages it.    PERTINENT HISTORY:  Per MD note: multiple Lt hip dislocations with spontaneous reduction since THA, about 6 wks ago incr pain with pain to ankle and intermittent Lt groin pain;  H/o L5-S1 laminectomy, fusions at L3-5. Was in PT for 1 year since THA.  Rt reverse TSA in June 2025.   L THA with anterior approx 09/25/2023 R THA with posterior approach  2013   PAIN:  Are you having pain? Yes: NPRS scale: 3/10 Pain location:  posterolateral L hip, lateral Lt ankle  Pain description: sharp/stabbing Aggravating factors: standing still  Relieving factors: the more I am up and moving  PRECAUTIONS:  None  RED FLAGS: None   WEIGHT BEARING RESTRICTIONS:  No  FALLS:  Has patient fallen in last 6 months? No  LIVING ENVIRONMENT: Only stairs are to get into/out of the house  OCCUPATION:  Retired, surveyor, minerals  PLOF:  Independent  PATIENT GOALS:  Yoga, glass work, decrease pain    OBJECTIVE:  Note: Objective measures were completed at Evaluation unless otherwise noted.  DIAGNOSTIC FINDINGS:  Lt Hip MRI: FINDINGS: Soft tissue and Muscle:   Bilateral hip arthroplasties with associated susceptibility artifact, which limits evaluation of the surrounding bone and soft tissues. No discrete periarticular collection. Asymmetric atrophy of the left gluteus medius and minimus muscles relative to the right. No significant intramuscular edema. The gluteal cuff insertions are not well evaluated due to susceptibility artifact. Fluid signal  at the level of the left gluteus minimus and medius cuff  insertions. Partial-thickness insertional tears of the left gluteal cuff insertions can not be excluded. No significant tendon retraction. Thickening of the left gluteal-iliotibial aponeurosis with surrounding edema, as can be seen in the setting of proximal friction syndrome.   Fluid signal also noted at the level of the right gluteus minimus and medius cuff insertions, partial-thickness insertional tears can not be excluded.Partial-thickness undersurface tear of the right hamstring tendon origin.   Status post hysterectomy. Sigmoid colonic diverticulosis. Visualized intrapelvic contents are otherwise grossly unremarkable   Bones/Hip:   Postoperative changes related to bilateral hip arthroplasties with associated susceptibility artifact, which limits evaluation of the surrounding bone and soft tissues. Within these limitations, no acute osseous abnormality is identified. No significant convincing marrow signal abnormality surrounding the bilateral hip arthroplasty components. Sacroiliac joints and pubic symphysis are anatomically aligned with mild degenerative changes. Degenerative changes of the visualized lower lumbar spine.   IMPRESSION: 1. Postoperative changes related to bilateral hip arthroplasties with associated susceptibility artifact, which limits evaluation of the surrounding bone and soft tissues. 2. Fluid signal at the level of the left gluteus minimus and medius cuff insertions. Partial-thickness insertional tears of the left gluteal cuff insertions can not be excluded. No significant tendon retraction. 3. Thickening of the overlying left gluteal-iliotibial band aponeurosis with surrounding edema, as can be seen in the setting of proximal friction syndrome. 4. Fluid signal also noted at the level of the right gluteus minimus and medius cuff insertions; partial-thickness insertional tears can not be excluded. 5. Partial-thickness undersurface tear of the right  hamstring tendon origin.  PATIENT SURVEYS:  LEFS  Extreme difficulty/unable (0), Quite a bit of difficulty (1), Moderate difficulty (2), Little difficulty (3), No difficulty (4) Survey date:  12/18 10/31/24  Any of your usual work, housework or school activities 2 2  2. Usual hobbies, recreational or sporting activities 2 3  3. Getting into/out of the bath 1 1  4. Walking between rooms 2 2  5. Putting on socks/shoes 2 4  6. Squatting  1 1  7. Lifting an object, like a bag of groceries from the floor 2 3  8. Performing light activities around your home 2 2  9. Performing heavy activities around your home 1 1  10. Getting into/out of a car 3 4  11. Walking 2 blocks 1 2  12. Walking 1 mile 0 1  13. Going up/down 10 stairs (1 flight) 1 2  14. Standing for 1 hour 2 1  15.  sitting for 1 hour 1 1  16. Running on even ground 0 1  17. Running on uneven ground 0 1  18. Making sharp turns while running fast 0 1  19. Hopping  0 1  20. Rolling over in bed 2 3  Score total:  25 37     COGNITIVE STATUS: Within functional limits for tasks assessed   SENSATION: Eval: numbness on left lateral thigh  EDEMA:  No  POSTURE:  Eval: wearing heel lift in left shoe Thoracic levoscoliosis with Rt shoulder drop Tends to sit with Lt leg crossed over Rt    GAIT: Comments: bilateral scissoring, Lt trendelenburg   Body Part #1 Hip   LOWER EXTREMITY ROM:     Eval: demo full available ROM through LEs   LOWER EXTREMITY MMT:     EVAL: gross MMT 4/5 in straight plane testing, is able to demo glut med activation in Lt SLS while  using UE support and required cuing, unable to hold                                                                                                            TREATMENT DATE:   Reviewed pt presentation, pain level, and response to prior treatment.   Pt ambulates with SPC and has a trendelenburg gait with R hip drop.  Nustep lvl 3 bilat UE/LE's x 5 mins Supine  bridge 2x15 Seated hip abd isometric with 5 sec hold approx 10 reps Standing Hip abduction 3x5 reps bilat Hip hike with bilat UE support 2x10  Manual Therapy:  STM to L ITB and glute in R S/L'ing with pillow b/w knees   [facility] Adult PT Treatment:                                                DATE: 11/03/24 Pt seen for aquatic therapy today.  Treatment took place in water  3.5-4.75 ft in depth at the [facility] pool. Temp of water  was 91.  Pt entered/exited the pool via stairs with hand rail.  *walking forward/  backward with cues for even step length,  * side stepping R/L with arm add/abdct with rainbow hand floats  * forward walking partial split squats with UE on yellow hand floats *UE On rainbow hand floats:  3 way LE kick x 10 each (some discomfort with Lt hip extension); heel/toe raises x 10 * forward walking kick * SLS solid blue noodle stomp x 10 slow, 10 fast each LE, repeated in hip abdct x 10 each LE * UE On wall: foot on solid noodle, ant hip circles (challenge with RLE); hip abdct/ add crossing midline x 10; alternating single leg clams x 8 each * standing balance on yellow noodle -> marching x 4 (LOB when in R SLS), mini squats x 10 (good challenge) * straddling noodle, UE on hand floats: gentle cycling    PATIENT EDUCATION:  Education details: exercise form/rationale Person educated: Patient Education method: Explanation, Demonstration, Tactile cues, and Verbal cues Education comprehension: verbalized understanding, returned demonstration, verbal cues required, tactile cues required, and needs further education  HOME EXERCISE PROGRAM: Heel strike + glut set to decrease scissoring, SLS with hip hike   ASSESSMENT:  CLINICAL IMPRESSION: Pt reporting reduction of pain and improvement in functional strength.  Pt continues to demonstrate trendelenburg gait on land; improved in water . Only minor discomfort in Lt hip with hip extension; improved from previous  sessions.   Pain remained low during session, 0.5/10 in ankle.  Therapist to check goals next session as time allows.   Pt presents to PT for a land based treatment.  Pt reports improvement in the pool though continues to have a lot of pain.  She ambulates with SPC and has a trendelenburg gait. I feel fine  n ochang in pain 3/10    Initial Impression Patient is  a 73 y.o. F who was seen today for physical therapy evaluation and treatment for weakness and pain in Lt hip and leg 1 year status post THA. Pt did have 2 incidences of subluxation posteriorly but has not had it happen again. Poor CKC activation of Lt hip with scissoring resulting in biomechanical chain instability, overuse of TFL and ITB, and impingement of lateral ankle. Pt will benefit from skilled PT to address deficits and improve ambulation tolerance. Aquatic PT will be helpful to improve proper activation through upright support of the water  and safety. Pt will then transition to land-based PT for further functional training.  Discussed upcoming MRI will be important to rule out tearing of hip abductor group.      REHAB POTENTIAL: Good  CLINICAL DECISION MAKING: Stable/uncomplicated  EVALUATION COMPLEXITY: Low   GOALS: Goals reviewed with patient? Yes  SHORT TERM GOALS: Target date: 11/05/24  Able to demo heel strike consistently in ambulation Baseline: Goal status: INITIAL  2.  Demo step up in pool with level pelvis Baseline:  Goal status: INITIAL  3.  Prepared for transition to land-based rehab after 4 weeks of aquatics Baseline: may need to extend goal date depending on scheduling Goal status: In progress 11/03/24    LONG TERM GOALS: Target date: POC date  Ambulation without scissoring Baseline:  Goal status: INITIAL  2.  Able to stand for at least 10 min pain <=3/10 Baseline:  Goal status: INITIAL  3.  LEFS to improve by MDC Baseline:  Goal status: INITIAL  4.  Return to yoga without limitation by  hip pain Baseline:  Goal status: In progress -10/31/24    PLAN:  PT FREQUENCY: 1-2x/week  PT DURATION: POC date  PLANNED INTERVENTIONS: 97164- PT Re-evaluation, 97750- Physical Performance Testing, 97110-Therapeutic exercises, 97530- Therapeutic activity, 97112- Neuromuscular re-education, 97535- Self Care, [id number]- Manual therapy, [phone]- Gait training, [phone]- Aquatic Therapy, [phone] (1-2 muscles), [phone] (3+ muscles)- Dry Needling, Patient/Family education, Balance training, Stair training, Taping, Joint mobilization, Spinal mobilization, Scar mobilization, Cryotherapy, and Moist heat.  PLAN FOR NEXT SESSION: continue aquatic therapy  Delon Aquas, PTA 11/07/24 4:24 PM [facility] [address] [place], [place], [address] Phone: [phone]   Fax:  [phone]  "

## 2024-11-07 ENCOUNTER — Ambulatory Visit (HOSPITAL_BASED_OUTPATIENT_CLINIC_OR_DEPARTMENT_OTHER): Admitting: Physical Therapy

## 2024-11-07 ENCOUNTER — Encounter (HOSPITAL_BASED_OUTPATIENT_CLINIC_OR_DEPARTMENT_OTHER): Payer: Self-pay | Admitting: Physical Therapy

## 2024-11-07 DIAGNOSIS — M25552 Pain in left hip: Secondary | ICD-10-CM | POA: Diagnosis not present

## 2024-11-07 DIAGNOSIS — M6281 Muscle weakness (generalized): Secondary | ICD-10-CM

## 2024-11-07 DIAGNOSIS — R262 Difficulty in walking, not elsewhere classified: Secondary | ICD-10-CM

## 2024-11-10 ENCOUNTER — Encounter (HOSPITAL_BASED_OUTPATIENT_CLINIC_OR_DEPARTMENT_OTHER): Payer: Self-pay

## 2024-11-10 ENCOUNTER — Ambulatory Visit (HOSPITAL_BASED_OUTPATIENT_CLINIC_OR_DEPARTMENT_OTHER)

## 2024-11-10 DIAGNOSIS — M25552 Pain in left hip: Secondary | ICD-10-CM | POA: Diagnosis not present

## 2024-11-10 DIAGNOSIS — M6281 Muscle weakness (generalized): Secondary | ICD-10-CM

## 2024-11-10 DIAGNOSIS — R262 Difficulty in walking, not elsewhere classified: Secondary | ICD-10-CM

## 2024-11-10 NOTE — Therapy (Signed)
 " OUTPATIENT PHYSICAL THERAPY TREATMENT   Patient Name: Regina Kim MRN: [id number] DOB:02/26/1951, 74 y.o., female Today's Date: 11/10/2024  END OF SESSION:  PT End of Session - 11/10/24 0807     Visit Number 9    Number of Visits 20    Date for Recertification  12/17/24    Authorization Type MCR    Progress Note Due on Visit 10    PT Start Time 0802    PT Stop Time 0845    PT Time Calculation (min) 43 min    Activity Tolerance Patient tolerated treatment well    Behavior During Therapy [facility] for tasks assessed/performed               Past Medical History:  Diagnosis Date   Allergy    Angina    history of angina- none in 5 yrs   Arthritis    Asthma    years ago & related to GERD   Cataract    growing now   Complication of anesthesia 30 yrs ago   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   Diabetes mellitus    dx'd 4 yrs ago-NIDDM   GERD (gastroesophageal reflux disease)    Hyperlipidemia    controlled on medicines    Myocardial infarction (HCC) 2005   no blockage found in heart cath   PONV (postoperative nausea and vomiting)    SUI (stress urinary incontinence, female) 05/08/2011   Urine protein increased    UTI (urinary tract infection)    Past Surgical History:  Procedure Laterality Date   ABDOMINAL HYSTERECTOMY     ANTERIOR LAT LUMBAR FUSION Left 07/14/2019   Procedure: Left Lumbar two-three Anterolateral decompression/lateral plate fixation;  Surgeon: Colon Shove, MD;  Location: [facility] OR;  Service: Neurosurgery;  Laterality: Left;   BACK SURGERY     BLADDER SURGERY  05/08/2011   Bladder Sling   BLADDER SURGERY  05/08/2011   Bladder Sling   BLADDER SUSPENSION  05/08/2011   Procedure: TRANSVAGINAL TAPE (TVT) PROCEDURE;  Surgeon: Lynwood FORBES Clubs II;  Location: [facility];  Service: Gynecology;  Laterality: N/A;  Transobturator Tape With Cystoscopy   CARDIAC CATHETERIZATION     COLONOSCOPY     CYSTOSCOPY     left total hip replacment Left  09/25/2023   POLYPECTOMY     TOTAL HIP ARTHROPLASTY  03/31/2012   Procedure: TOTAL HIP ARTHROPLASTY;  Surgeon: Dempsey JINNY Sensor, MD;  Location: [facility] OR;  Service: Orthopedics;  Laterality: Right;   Patient Active Problem List   Diagnosis Date Noted   S/P total left hip arthroplasty 10/03/2024   S/P reverse total shoulder arthroplasty, right 10/03/2024   Chronic pain syndrome 10/03/2024   It band syndrome, left 10/03/2024   Weakness of left hip 10/03/2024   Hamstring tightness of both lower extremities 10/03/2024   Chronic pain of left ankle 10/03/2024   Sensorineural hearing loss, bilateral 11/11/2023   Lumbar stenosis with neurogenic claudication 07/14/2019   Avascular necrosis of hip (HCC) 04/02/2012   SUI (stress urinary incontinence, female) 05/08/2011     REFERRING PROVIDER:  Emeline Joesph BROCKS, DO      REFERRING DIAG:  [phone] (ICD-10-CM) - S/P total left hip arthroplasty  R29.898 (ICD-10-CM) - Weakness of left hip  M62.9 (ICD-10-CM) - Hamstring tightness of both lower extremities  Evaluate and treat. Aquatherapy for left hip stabilizer weakness s/p posterior THA, and bilaterla hamstring tightness. Work on strengthening and balancing proximal LE muscles, stretching, and  gait stability   Rationale for Evaluation and Treatment: Rehabilitation  THERAPY DIAG:  Pain in left hip  Difficulty in walking, not elsewhere classified  Muscle weakness (generalized)  ONSET DATE: THA 09/25/23   SUBJECTIVE:                                                                                                                                                                                           SUBJECTIVE STATEMENT: Pt reports my hip is going doing good surprisingly! She reports no pain at entry. Able to go to [employer] this week which went well. Denies any feelings of subluxation recently.    PERTINENT HISTORY:  Per MD note: multiple Lt hip dislocations with spontaneous reduction since  THA, about 6 wks ago incr pain with pain to ankle and intermittent Lt groin pain;  H/o L5-S1 laminectomy, fusions at L3-5. Was in PT for 1 year since THA.  Rt reverse TSA in June 2025.   L THA with anterior approx 09/25/2023 R THA with posterior approach  2013   PAIN:  Are you having pain? Yes: NPRS scale: 3/10 Pain location:  posterolateral L hip, lateral Lt ankle  Pain description: sharp/stabbing Aggravating factors: standing still  Relieving factors: the more I am up and moving  PRECAUTIONS:  None  RED FLAGS: None   WEIGHT BEARING RESTRICTIONS:  No  FALLS:  Has patient fallen in last 6 months? No  LIVING ENVIRONMENT: Only stairs are to get into/out of the house  OCCUPATION:  Retired, surveyor, minerals  PLOF:  Independent  PATIENT GOALS:  Yoga, glass work, decrease pain    OBJECTIVE:  Note: Objective measures were completed at Evaluation unless otherwise noted.  DIAGNOSTIC FINDINGS:  Lt Hip MRI: FINDINGS: Soft tissue and Muscle:   Bilateral hip arthroplasties with associated susceptibility artifact, which limits evaluation of the surrounding bone and soft tissues. No discrete periarticular collection. Asymmetric atrophy of the left gluteus medius and minimus muscles relative to the right. No significant intramuscular edema. The gluteal cuff insertions are not well evaluated due to susceptibility artifact. Fluid signal at the level of the left gluteus minimus and medius cuff insertions. Partial-thickness insertional tears of the left gluteal cuff insertions can not be excluded. No significant tendon retraction. Thickening of the left gluteal-iliotibial aponeurosis with surrounding edema, as can be seen in the setting of proximal friction syndrome.   Fluid signal also noted at the level of the right gluteus minimus and medius cuff insertions, partial-thickness insertional tears can not be excluded.Partial-thickness undersurface tear of the  right hamstring tendon origin.   Status post hysterectomy. Sigmoid colonic diverticulosis. Visualized intrapelvic contents are otherwise  grossly unremarkable   Bones/Hip:   Postoperative changes related to bilateral hip arthroplasties with associated susceptibility artifact, which limits evaluation of the surrounding bone and soft tissues. Within these limitations, no acute osseous abnormality is identified. No significant convincing marrow signal abnormality surrounding the bilateral hip arthroplasty components. Sacroiliac joints and pubic symphysis are anatomically aligned with mild degenerative changes. Degenerative changes of the visualized lower lumbar spine.   IMPRESSION: 1. Postoperative changes related to bilateral hip arthroplasties with associated susceptibility artifact, which limits evaluation of the surrounding bone and soft tissues. 2. Fluid signal at the level of the left gluteus minimus and medius cuff insertions. Partial-thickness insertional tears of the left gluteal cuff insertions can not be excluded. No significant tendon retraction. 3. Thickening of the overlying left gluteal-iliotibial band aponeurosis with surrounding edema, as can be seen in the setting of proximal friction syndrome. 4. Fluid signal also noted at the level of the right gluteus minimus and medius cuff insertions; partial-thickness insertional tears can not be excluded. 5. Partial-thickness undersurface tear of the right hamstring tendon origin.  PATIENT SURVEYS:  LEFS  Extreme difficulty/unable (0), Quite a bit of difficulty (1), Moderate difficulty (2), Little difficulty (3), No difficulty (4) Survey date:  12/18 10/31/24  Any of your usual work, housework or school activities 2 2  2. Usual hobbies, recreational or sporting activities 2 3  3. Getting into/out of the bath 1 1  4. Walking between rooms 2 2  5. Putting on socks/shoes 2 4  6. Squatting  1 1  7. Lifting an object, like a  bag of groceries from the floor 2 3  8. Performing light activities around your home 2 2  9. Performing heavy activities around your home 1 1  10. Getting into/out of a car 3 4  11. Walking 2 blocks 1 2  12. Walking 1 mile 0 1  13. Going up/down 10 stairs (1 flight) 1 2  14. Standing for 1 hour 2 1  15.  sitting for 1 hour 1 1  16. Running on even ground 0 1  17. Running on uneven ground 0 1  18. Making sharp turns while running fast 0 1  19. Hopping  0 1  20. Rolling over in bed 2 3  Score total:  25 37     COGNITIVE STATUS: Within functional limits for tasks assessed   SENSATION: Eval: numbness on left lateral thigh  EDEMA:  No  POSTURE:  Eval: wearing heel lift in left shoe Thoracic levoscoliosis with Rt shoulder drop Tends to sit with Lt leg crossed over Rt    GAIT: Comments: bilateral scissoring, Lt trendelenburg   Body Part #1 Hip   LOWER EXTREMITY ROM:     Eval: demo full available ROM through LEs   LOWER EXTREMITY MMT:     EVAL: gross MMT 4/5 in straight plane testing, is able to demo glut med activation in Lt SLS while using UE support and required cuing, unable to hold  TREATMENT DATE:    11/10/24 Nu step L5 x47min Supine bridge 2x15 Hooklying adductor squeeze 5 x20  Hooklying clam GTB x30 SLR 2x5 bilateral Hip hike with bilat UE support 2x10 Sit to stands 2x10 (staggered) Manual Therapy:  STM/IASTM to L ITB and quad HEP  Previous: Reviewed pt presentation, pain level, and response to prior treatment.   Pt ambulates with SPC and has a trendelenburg gait with R hip drop.  Nustep lvl 3 bilat UE/LE's x 5 mins Supine bridge 2x15 Seated hip abd isometric with 5 sec hold approx 10 reps Standing Hip abduction 3x5 reps bilat Hip hike with bilat UE support 2x10  Manual Therapy:  STM to L ITB and glute in R S/L'ing with pillow b/w  knees   [facility] Adult PT Treatment:                                                DATE: 11/03/24 Pt seen for aquatic therapy today.  Treatment took place in water  3.5-4.75 ft in depth at the [facility] pool. Temp of water  was 91.  Pt entered/exited the pool via stairs with hand rail.  *walking forward/  backward with cues for even step length,  * side stepping R/L with arm add/abdct with rainbow hand floats  * forward walking partial split squats with UE on yellow hand floats *UE On rainbow hand floats:  3 way LE kick x 10 each (some discomfort with Lt hip extension); heel/toe raises x 10 * forward walking kick * SLS solid blue noodle stomp x 10 slow, 10 fast each LE, repeated in hip abdct x 10 each LE * UE On wall: foot on solid noodle, ant hip circles (challenge with RLE); hip abdct/ add crossing midline x 10; alternating single leg clams x 8 each * standing balance on yellow noodle -> marching x 4 (LOB when in R SLS), mini squats x 10 (good challenge) * straddling noodle, UE on hand floats: gentle cycling    PATIENT EDUCATION:  Education details: exercise form/rationale Person educated: Patient Education method: Explanation, Demonstration, Tactile cues, and Verbal cues Education comprehension: verbalized understanding, returned demonstration, verbal cues required, tactile cues required, and needs further education  HOME EXERCISE PROGRAM: Heel strike + glut set to decrease scissoring, SLS with hip hike  Access Code: [email or web address] URL: [email or web address] Date: 11/10/2024 Prepared by: Asberry Rodes  Exercises - Supine ITB Stretch with Strap  - 1 x daily - 7 x weekly - 3 sets - 10 reps - Roller Massage Elongated IT Band Release  - 1-2 x daily - 7 x weekly - hold - Supine Bridge  - 2 x daily - 7 x weekly - 3 sets - 10 reps - Supine Active Straight Leg Raise  - 1-2 x daily - 7 x weekly - 2-3 sets - 5 reps - Hip Hiking on Step  - 2 x daily - 7 x weekly - 3  sets - 10 reps - Staggered Sit-to-Stand  - 2 x daily - 7 x weekly - 2-3 sets - 10 reps  ASSESSMENT:  CLINICAL IMPRESSION: Able to gently progress with [facility] and strengthening interventions today with good tolerance. She denied pain with any task. Cuing provided throughout session for correction of form and correct muscle activation. Again performed STM to reduce restrictions in L ITB as she had good response to this  last session. Provided land HEP and reviewed with pt with good understanding. Will continue to progress as tolerated.        REHAB POTENTIAL: Good  CLINICAL DECISION MAKING: Stable/uncomplicated  EVALUATION COMPLEXITY: Low   GOALS: Goals reviewed with patient? Yes  SHORT TERM GOALS: Target date: 11/05/24  Able to demo heel strike consistently in ambulation Baseline: Goal status: INITIAL  2.  Demo step up in pool with level pelvis Baseline:  Goal status: INITIAL  3.  Prepared for transition to land-based rehab after 4 weeks of aquatics Baseline: may need to extend goal date depending on scheduling Goal status: In progress 11/03/24    LONG TERM GOALS: Target date: POC date  Ambulation without scissoring Baseline:  Goal status: INITIAL  2.  Able to stand for at least 10 min pain <=3/10 Baseline:  Goal status: INITIAL  3.  LEFS to improve by MDC Baseline:  Goal status: INITIAL  4.  Return to yoga without limitation by hip pain Baseline:  Goal status: In progress -10/31/24    PLAN:  PT FREQUENCY: 1-2x/week  PT DURATION: POC date  PLANNED INTERVENTIONS: [id number]- PT Re-evaluation, 97750- Physical Performance Testing, 97110-Therapeutic exercises, 97530- Therapeutic activity, 97112- Neuromuscular re-education, 97535- Self Care, [id number]- Manual therapy, [phone]- Gait training, [phone]- Aquatic Therapy, [phone] (1-2 muscles), [phone] (3+ muscles)- Dry Needling, Patient/Family education, Balance training, Stair training, Taping, Joint mobilization, Spinal mobilization,  Scar mobilization, Cryotherapy, and Moist heat.  PLAN FOR NEXT SESSION: continue aquatic therapy  Amani Marseille, PTA  11/10/24 10:25 AM  [facility] [address] [place], [place], [address] Phone: [phone]   Fax:  [phone]  "

## 2024-11-15 ENCOUNTER — Ambulatory Visit (HOSPITAL_BASED_OUTPATIENT_CLINIC_OR_DEPARTMENT_OTHER)

## 2024-11-16 NOTE — Therapy (Signed)
 " OUTPATIENT PHYSICAL THERAPY TREATMENT  Progress Note Reporting Period 10/06/2024 to 11/17/2024  See note below for Objective Data and Assessment of Progress/Goals.      Patient Name: Regina Kim MRN: [id number] DOB:[birth year], 74 y.o., female Today's Date: 11/17/2024  END OF SESSION:  PT End of Session - 11/17/24 0837     Visit Number 10    Number of Visits 20    Date for Recertification  12/17/24    Authorization Type MCR    Progress Note Due on Visit 20    PT Start Time 0808    PT Stop Time 0849    PT Time Calculation (min) 41 min    Activity Tolerance Patient tolerated treatment well    Behavior During Therapy [facility] for tasks assessed/performed                Past Medical History:  Diagnosis Date   Allergy    Angina    history of angina- none in 5 yrs   Arthritis    Asthma    years ago & related to GERD   Cataract    growing now   Complication of anesthesia 30 yrs ago   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   Diabetes mellitus    dx'd 4 yrs ago-NIDDM   GERD (gastroesophageal reflux disease)    Hyperlipidemia    controlled on medicines    Myocardial infarction (HCC) 2005   no blockage found in heart cath   PONV (postoperative nausea and vomiting)    SUI (stress urinary incontinence, female) 05/08/2011   Urine protein increased    UTI (urinary tract infection)    Past Surgical History:  Procedure Laterality Date   ABDOMINAL HYSTERECTOMY     ANTERIOR LAT LUMBAR FUSION Left 07/14/2019   Procedure: Left Lumbar two-three Anterolateral decompression/lateral plate fixation;  Surgeon: Colon Shove, MD;  Location: [facility] OR;  Service: Neurosurgery;  Laterality: Left;   BACK SURGERY     BLADDER SURGERY  05/08/2011   Bladder Sling   BLADDER SURGERY  05/08/2011   Bladder Sling   BLADDER SUSPENSION  05/08/2011   Procedure: TRANSVAGINAL TAPE (TVT) PROCEDURE;  Surgeon: Lynwood FORBES Clubs II;  Location: [facility];  Service: Gynecology;  Laterality:  N/A;  Transobturator Tape With Cystoscopy   CARDIAC CATHETERIZATION     COLONOSCOPY     CYSTOSCOPY     left total hip replacment Left 09/25/2023   POLYPECTOMY     TOTAL HIP ARTHROPLASTY  03/31/2012   Procedure: TOTAL HIP ARTHROPLASTY;  Surgeon: Dempsey JINNY Sensor, MD;  Location: [facility] OR;  Service: Orthopedics;  Laterality: Right;   Patient Active Problem List   Diagnosis Date Noted   S/P total left hip arthroplasty 10/03/2024   S/P reverse total shoulder arthroplasty, right 10/03/2024   Chronic pain syndrome 10/03/2024   It band syndrome, left 10/03/2024   Weakness of left hip 10/03/2024   Hamstring tightness of both lower extremities 10/03/2024   Chronic pain of left ankle 10/03/2024   Sensorineural hearing loss, bilateral 11/11/2023   Lumbar stenosis with neurogenic claudication 07/14/2019   Avascular necrosis of hip (HCC) 04/02/2012   SUI (stress urinary incontinence, female) 05/08/2011     REFERRING PROVIDER:  Emeline Joesph BROCKS, DO      REFERRING DIAG:  [phone] (ICD-10-CM) - S/P total left hip arthroplasty  R29.898 (ICD-10-CM) - Weakness of left hip  M62.9 (ICD-10-CM) - Hamstring tightness of both lower extremities  Evaluate and treat.  Aquatherapy for left hip stabilizer weakness s/p posterior THA, and bilaterla hamstring tightness. Work on restaurant manager, fast food proximal LE muscles, stretching, and gait stability   Rationale for Evaluation and Treatment: Rehabilitation  THERAPY DIAG:  Pain in left hip  Difficulty in walking, not elsewhere classified  Muscle weakness (generalized)  ONSET DATE: THA 09/25/23   SUBJECTIVE:                                                                                                                                                                                           SUBJECTIVE STATEMENT: Pt states she is doing much better.  Rarely am I having pain here (in hip).  She states the strengthening is helping.  Her ankle still hurts.   Pt is not taking tylenol  and is not using the lidocaine  patches.  Denies any feelings of subluxation recently.  Pt states her ITB is always tight.  Pt denies any adverse effects after prior treatment.  Pt has returned to yoga 2x/wk and states she is not limited with yoga due to pain.  Pt states she is more conscious of her gait and thinks her gait has improved.  Pt states she would have 5-6/10 pain after standing 10 mins.     PERTINENT HISTORY:  Per MD note: multiple Lt hip dislocations with spontaneous reduction since THA, about 6 wks ago incr pain with pain to ankle and intermittent Lt groin pain;  H/o L5-S1 laminectomy, fusions at L3-5. Was in PT for 1 year since THA.  Rt reverse TSA in June 2025.   L THA with anterior approx 09/25/2023 R THA with posterior approach  2013   PAIN:  Are you having pain? Yes: NPRS scale:  1-2/10  /  3/10.  In the past 2 weeks:  0/10 best and 6-7/10 worst Pain location:  posterolateral L hip, anterolateral Lt ankle  Pain description: nagging ache Aggravating factors: standing still  Relieving factors: the more I am up and moving  PRECAUTIONS:  None  RED FLAGS: None   WEIGHT BEARING RESTRICTIONS:  No  FALLS:  Has patient fallen in last 6 months? No  LIVING ENVIRONMENT: Only stairs are to get into/out of the house  OCCUPATION:  Retired, surveyor, minerals  PLOF:  Independent  PATIENT GOALS:  Yoga, glass work, decrease pain    OBJECTIVE:  Note: Objective measures were completed at Evaluation unless otherwise noted.  DIAGNOSTIC FINDINGS:  Lt Hip MRI: FINDINGS: Soft tissue and Muscle:   Bilateral hip arthroplasties with associated susceptibility artifact, which limits evaluation of the surrounding bone and soft tissues. No discrete periarticular collection. Asymmetric atrophy of the left gluteus  medius and minimus muscles relative to the right. No significant intramuscular edema. The gluteal cuff insertions are not well evaluated  due to susceptibility artifact. Fluid signal at the level of the left gluteus minimus and medius cuff insertions. Partial-thickness insertional tears of the left gluteal cuff insertions can not be excluded. No significant tendon retraction. Thickening of the left gluteal-iliotibial aponeurosis with surrounding edema, as can be seen in the setting of proximal friction syndrome.   Fluid signal also noted at the level of the right gluteus minimus and medius cuff insertions, partial-thickness insertional tears can not be excluded.Partial-thickness undersurface tear of the right hamstring tendon origin.   Status post hysterectomy. Sigmoid colonic diverticulosis. Visualized intrapelvic contents are otherwise grossly unremarkable   Bones/Hip:   Postoperative changes related to bilateral hip arthroplasties with associated susceptibility artifact, which limits evaluation of the surrounding bone and soft tissues. Within these limitations, no acute osseous abnormality is identified. No significant convincing marrow signal abnormality surrounding the bilateral hip arthroplasty components. Sacroiliac joints and pubic symphysis are anatomically aligned with mild degenerative changes. Degenerative changes of the visualized lower lumbar spine.   IMPRESSION: 1. Postoperative changes related to bilateral hip arthroplasties with associated susceptibility artifact, which limits evaluation of the surrounding bone and soft tissues. 2. Fluid signal at the level of the left gluteus minimus and medius cuff insertions. Partial-thickness insertional tears of the left gluteal cuff insertions can not be excluded. No significant tendon retraction. 3. Thickening of the overlying left gluteal-iliotibial band aponeurosis with surrounding edema, as can be seen in the setting of proximal friction syndrome. 4. Fluid signal also noted at the level of the right gluteus minimus and medius cuff insertions;  partial-thickness insertional tears can not be excluded. 5. Partial-thickness undersurface tear of the right hamstring tendon origin.  PATIENT SURVEYS:  LEFS  Extreme difficulty/unable (0), Quite a bit of difficulty (1), Moderate difficulty (2), Little difficulty (3), No difficulty (4) Survey date:  12/18 10/31/24 11/17/24  Any of your usual work, housework or school activities 2 2 3   2. Usual hobbies, recreational or sporting activities 2 3 2   3. Getting into/out of the bath 1 1 1   4. Walking between rooms 2 2 4   5. Putting on socks/shoes 2 4 4   6. Squatting  1 1 1   7. Lifting an object, like a bag of groceries from the floor 2 3 4   8. Performing light activities around your home 2 2 3   9. Performing heavy activities around your home 1 1 2   10. Getting into/out of a car 3 4 4   11. Walking 2 blocks 1 2 2   12. Walking 1 mile 0 1 0  13. Going up/down 10 stairs (1 flight) 1 2 2   14. Standing for 1 hour 2 1 2   15.  sitting for 1 hour 1 1 3   16. Running on even ground 0 1 0  17. Running on uneven ground 0 1 0  18. Making sharp turns while running fast 0 1 0  19. Hopping  0 1 0  20. Rolling over in bed 2 3 3   Score total:  25 37 40     COGNITIVE STATUS: Within functional limits for tasks assessed   SENSATION: Eval: numbness on left lateral thigh  EDEMA:  No  POSTURE:  Eval: wearing heel lift in left shoe Thoracic levoscoliosis with Rt shoulder drop Tends to sit with Lt leg crossed over Rt    GAIT: Comments: bilateral scissoring, Lt trendelenburg  Body Part #1 Hip   LOWER EXTREMITY ROM:     Eval: demo full available ROM through LEs   LOWER EXTREMITY MMT:     EVAL: gross MMT 4/5 in straight plane testing, is able to demo glut med activation in Lt SLS while using UE support and required cuing, unable to hold                                                                                                            TREATMENT DATE:   11/17/24 Nu step L5  x59min  Gait:  Trendelenburg gait with R hip drop.  Pt has a heel strike.  Improved scissoring though began to when she fatigued.   MMT:   Hip flexion:  R:  5/5, L:  4+/5 Knee extension:  5/5 bilat Hip abd:  R:  16.8, L:  20.6  Standing hip hikes 2x10 with UE support Standing hip abd with UE support Staggered sit to stands from table (with elevation) L LE back  2x10 Supine bridge 2x15  Pt completed LEFS.  See above   11/10/24 Nu step L5 x69min Supine bridge 2x15 Hooklying adductor squeeze 5 x20  Hooklying clam GTB x30 SLR 2x5 bilateral Hip hike with bilat UE support 2x10 Sit to stands 2x10 (staggered) Manual Therapy:  STM/IASTM to L ITB and quad HEP  Previous: Reviewed pt presentation, pain level, and response to prior treatment.   Pt ambulates with SPC and has a trendelenburg gait with R hip drop.  Nustep lvl 3 bilat UE/LE's x 5 mins Supine bridge 2x15 Seated hip abd isometric with 5 sec hold approx 10 reps Standing Hip abduction 3x5 reps bilat Hip hike with bilat UE support 2x10  Manual Therapy:  STM to L ITB and glute in R S/L'ing with pillow b/w knees    PATIENT EDUCATION:  Education details: exercise form/rationale, objective findings, progress Person educated: Patient Education method: Explanation, Demonstration, Tactile cues, and Verbal cues Education comprehension: verbalized understanding, returned demonstration, verbal cues required, tactile cues required, and needs further education  HOME EXERCISE PROGRAM: Heel strike + glut set to decrease scissoring, SLS with hip hike  Access Code: [email or web address] URL: [email or web address] Date: 11/10/2024 Prepared by: Asberry Rodes  Exercises - Supine ITB Stretch with Strap  - 1 x daily - 7 x weekly - 3 sets - 10 reps - Roller Massage Elongated IT Band Release  - 1-2 x daily - 7 x weekly - hold - Supine Bridge  - 2 x daily - 7 x weekly - 3 sets - 10 reps - Supine Active Straight Leg Raise  - 1-2 x  daily - 7 x weekly - 2-3 sets - 5 reps - Hip Hiking on Step  - 2 x daily - 7 x weekly - 3 sets - 10 reps - Staggered Sit-to-Stand  - 2 x daily - 7 x weekly - 2-3 sets - 10 reps  ASSESSMENT:  CLINICAL IMPRESSION: Pt presents to treatment stating she is doing much better and having less pain.  Pt has received  2 prior land based treatments.  Pt is taking less medications.  Pt has returned to yoga and states she is not limited with yoga due to pain.  Pt continues to have gait deficits though is improving.  She continues to have a trendelenburg gait though has improved heel strike and scissoring.  Pt demonstrates good knee flexion strength and R hip flexion strength with slight weakness in L hip strength.  Pt has weakness in bilat hip abduction.  Pt's LEFS improved by 3 points from prior testing which was nearly 2.5 weeks ago.  This improvement was not clinically significant though is clinically significant compared to initial testing.  Pt has met 2/3 STG's and 2/4 LTG's.  She performed exercises well and reports increased pain to 4-5/10 after treatment.  Pt should benefit from cont skilled PT to address goals and impairments and improve overall function.     REHAB POTENTIAL: Good  CLINICAL DECISION MAKING: Stable/uncomplicated  EVALUATION COMPLEXITY: Low   GOALS: Goals reviewed with patient? Yes  SHORT TERM GOALS: Target date: 11/05/24  Able to demo heel strike consistently in ambulation Baseline: Goal status: GOAL MET  1/29  2.  Demo step up in pool with level pelvis Baseline:  Goal status: INITIAL  3.  Prepared for transition to land-based rehab after 4 weeks of aquatics Baseline: may need to extend goal date depending on scheduling Goal status: GOAL MET  1/29    LONG TERM GOALS: Target date: POC date  Ambulation without scissoring Baseline:  Goal status: PROGRESSING   1/29  2.  Able to stand for at least 10 min pain <=3/10 Baseline:  Goal status: ONGOING  3.  LEFS to  improve by MDC Baseline:  Goal status: GOAL MET  4.  Return to yoga without limitation by hip pain Baseline:  Goal status:  GOAL MET  1/29    PLAN:  PT FREQUENCY: 1-2x/week  PT DURATION: 4-5 weeks  PLANNED INTERVENTIONS: [id number]- PT Re-evaluation, 97750- Physical Performance Testing, 97110-Therapeutic exercises, 97530- Therapeutic activity, 97112- Neuromuscular re-education, 97535- Self Care, [id number]- Manual therapy, [phone]- Gait training, [phone]- Aquatic Therapy, [phone] (1-2 muscles), [id number] (3+ muscles)- Dry Needling, Patient/Family education, Balance training, Stair training, Taping, Joint mobilization, Spinal mobilization, Scar mobilization, Cryotherapy, and Moist heat.  PLAN FOR NEXT SESSION: continue land based therapy  Leigh Minerva III PT, DPT 11/17/24 10:08 PM   [facility] [address] [place], [place], [address] Phone: [phone]   Fax:  [phone]  "

## 2024-11-17 ENCOUNTER — Ambulatory Visit (HOSPITAL_BASED_OUTPATIENT_CLINIC_OR_DEPARTMENT_OTHER): Admitting: Physical Therapy

## 2024-11-17 ENCOUNTER — Encounter (HOSPITAL_BASED_OUTPATIENT_CLINIC_OR_DEPARTMENT_OTHER): Payer: Self-pay | Admitting: Physical Therapy

## 2024-11-17 DIAGNOSIS — M25552 Pain in left hip: Secondary | ICD-10-CM | POA: Diagnosis not present

## 2024-11-17 DIAGNOSIS — M6281 Muscle weakness (generalized): Secondary | ICD-10-CM

## 2024-11-17 DIAGNOSIS — R262 Difficulty in walking, not elsewhere classified: Secondary | ICD-10-CM

## 2024-11-22 ENCOUNTER — Ambulatory Visit (HOSPITAL_BASED_OUTPATIENT_CLINIC_OR_DEPARTMENT_OTHER)

## 2024-11-22 ENCOUNTER — Encounter (HOSPITAL_BASED_OUTPATIENT_CLINIC_OR_DEPARTMENT_OTHER): Payer: Self-pay

## 2024-11-22 DIAGNOSIS — M25552 Pain in left hip: Secondary | ICD-10-CM

## 2024-11-22 DIAGNOSIS — R262 Difficulty in walking, not elsewhere classified: Secondary | ICD-10-CM

## 2024-11-22 DIAGNOSIS — M6281 Muscle weakness (generalized): Secondary | ICD-10-CM

## 2024-11-22 NOTE — Therapy (Signed)
 " OUTPATIENT PHYSICAL THERAPY TREATMENT    Patient Name: Regina Kim MRN: [id number] DOB:1951/04/30, 74 y.o., female Today's Date: 11/22/2024  END OF SESSION:  PT End of Session - 11/22/24 0804     Visit Number 11    Number of Visits 20    Date for Recertification  12/17/24    Authorization Type MCR    Progress Note Due on Visit 20    PT Start Time 0800    PT Stop Time 0845    PT Time Calculation (min) 45 min    Activity Tolerance Patient tolerated treatment well    Behavior During Therapy [facility] for tasks assessed/performed                 Past Medical History:  Diagnosis Date   Allergy    Angina    history of angina- none in 5 yrs   Arthritis    Asthma    years ago & related to GERD   Cataract    growing now   Complication of anesthesia 30 yrs ago   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   Diabetes mellitus    dx'd 4 yrs ago-NIDDM   GERD (gastroesophageal reflux disease)    Hyperlipidemia    controlled on medicines    Myocardial infarction (HCC) 2005   no blockage found in heart cath   PONV (postoperative nausea and vomiting)    SUI (stress urinary incontinence, female) 05/08/2011   Urine protein increased    UTI (urinary tract infection)    Past Surgical History:  Procedure Laterality Date   ABDOMINAL HYSTERECTOMY     ANTERIOR LAT LUMBAR FUSION Left 07/14/2019   Procedure: Left Lumbar two-three Anterolateral decompression/lateral plate fixation;  Surgeon: Colon Shove, MD;  Location: [facility] OR;  Service: Neurosurgery;  Laterality: Left;   BACK SURGERY     BLADDER SURGERY  05/08/2011   Bladder Sling   BLADDER SURGERY  05/08/2011   Bladder Sling   BLADDER SUSPENSION  05/08/2011   Procedure: TRANSVAGINAL TAPE (TVT) PROCEDURE;  Surgeon: Lynwood FORBES Clubs II;  Location: [facility];  Service: Gynecology;  Laterality: N/A;  Transobturator Tape With Cystoscopy   CARDIAC CATHETERIZATION     COLONOSCOPY     CYSTOSCOPY     left total hip replacment  Left 09/25/2023   POLYPECTOMY     TOTAL HIP ARTHROPLASTY  03/31/2012   Procedure: TOTAL HIP ARTHROPLASTY;  Surgeon: Dempsey JINNY Sensor, MD;  Location: [facility] OR;  Service: Orthopedics;  Laterality: Right;   Patient Active Problem List   Diagnosis Date Noted   S/P total left hip arthroplasty 10/03/2024   S/P reverse total shoulder arthroplasty, right 10/03/2024   Chronic pain syndrome 10/03/2024   It band syndrome, left 10/03/2024   Weakness of left hip 10/03/2024   Hamstring tightness of both lower extremities 10/03/2024   Chronic pain of left ankle 10/03/2024   Sensorineural hearing loss, bilateral 11/11/2023   Lumbar stenosis with neurogenic claudication 07/14/2019   Avascular necrosis of hip (HCC) 04/02/2012   SUI (stress urinary incontinence, female) 05/08/2011     REFERRING PROVIDER:  Emeline Joesph BROCKS, DO      REFERRING DIAG:  [phone] (ICD-10-CM) - S/P total left hip arthroplasty  R29.898 (ICD-10-CM) - Weakness of left hip  M62.9 (ICD-10-CM) - Hamstring tightness of both lower extremities  Evaluate and treat. Aquatherapy for left hip stabilizer weakness s/p posterior THA, and bilaterla hamstring tightness. Work on electrical engineer LE  muscles, stretching, and gait stability   Rationale for Evaluation and Treatment: Rehabilitation  THERAPY DIAG:  Pain in left hip  Difficulty in walking, not elsewhere classified  Muscle weakness (generalized)  ONSET DATE: THA 09/25/23   SUBJECTIVE:                                                                                                                                                                                           SUBJECTIVE STATEMENT: Pt reports increase in pain after being stuck at home due to the weather this weekend. Rates pain 5/10. No feelings of subluxation. I had to use a lidocaine  patch last night.   PERTINENT HISTORY:  Per MD note: multiple Lt hip dislocations with spontaneous reduction since  THA, about 6 wks ago incr pain with pain to ankle and intermittent Lt groin pain;  H/o L5-S1 laminectomy, fusions at L3-5. Was in PT for 1 year since THA.  Rt reverse TSA in June 2025.   L THA with anterior approx 09/25/2023 R THA with posterior approach  2013   PAIN:  Are you having pain? Yes: NPRS scale:  5/10  /.   Pain location:  posterolateral L hip, anterolateral Lt ankle  Pain description: nagging ache Aggravating factors: standing still  Relieving factors: the more I am up and moving  PRECAUTIONS:  None  RED FLAGS: None   WEIGHT BEARING RESTRICTIONS:  No  FALLS:  Has patient fallen in last 6 months? No  LIVING ENVIRONMENT: Only stairs are to get into/out of the house  OCCUPATION:  Retired, surveyor, minerals  PLOF:  Independent  PATIENT GOALS:  Yoga, glass work, decrease pain    OBJECTIVE:  Note: Objective measures were completed at Evaluation unless otherwise noted.  DIAGNOSTIC FINDINGS:  Lt Hip MRI: FINDINGS: Soft tissue and Muscle:   Bilateral hip arthroplasties with associated susceptibility artifact, which limits evaluation of the surrounding bone and soft tissues. No discrete periarticular collection. Asymmetric atrophy of the left gluteus medius and minimus muscles relative to the right. No significant intramuscular edema. The gluteal cuff insertions are not well evaluated due to susceptibility artifact. Fluid signal at the level of the left gluteus minimus and medius cuff insertions. Partial-thickness insertional tears of the left gluteal cuff insertions can not be excluded. No significant tendon retraction. Thickening of the left gluteal-iliotibial aponeurosis with surrounding edema, as can be seen in the setting of proximal friction syndrome.   Fluid signal also noted at the level of the right gluteus minimus and medius cuff insertions, partial-thickness insertional tears can not be excluded.Partial-thickness undersurface tear of the  right hamstring tendon origin.   Status post  hysterectomy. Sigmoid colonic diverticulosis. Visualized intrapelvic contents are otherwise grossly unremarkable   Bones/Hip:   Postoperative changes related to bilateral hip arthroplasties with associated susceptibility artifact, which limits evaluation of the surrounding bone and soft tissues. Within these limitations, no acute osseous abnormality is identified. No significant convincing marrow signal abnormality surrounding the bilateral hip arthroplasty components. Sacroiliac joints and pubic symphysis are anatomically aligned with mild degenerative changes. Degenerative changes of the visualized lower lumbar spine.   IMPRESSION: 1. Postoperative changes related to bilateral hip arthroplasties with associated susceptibility artifact, which limits evaluation of the surrounding bone and soft tissues. 2. Fluid signal at the level of the left gluteus minimus and medius cuff insertions. Partial-thickness insertional tears of the left gluteal cuff insertions can not be excluded. No significant tendon retraction. 3. Thickening of the overlying left gluteal-iliotibial band aponeurosis with surrounding edema, as can be seen in the setting of proximal friction syndrome. 4. Fluid signal also noted at the level of the right gluteus minimus and medius cuff insertions; partial-thickness insertional tears can not be excluded. 5. Partial-thickness undersurface tear of the right hamstring tendon origin.  PATIENT SURVEYS:  LEFS  Extreme difficulty/unable (0), Quite a bit of difficulty (1), Moderate difficulty (2), Little difficulty (3), No difficulty (4) Survey date:  12/18 10/31/24 11/17/24  Any of your usual work, housework or school activities 2 2 3   2. Usual hobbies, recreational or sporting activities 2 3 2   3. Getting into/out of the bath 1 1 1   4. Walking between rooms 2 2 4   5. Putting on socks/shoes 2 4 4   6. Squatting  1 1 1   7.  Lifting an object, like a bag of groceries from the floor 2 3 4   8. Performing light activities around your home 2 2 3   9. Performing heavy activities around your home 1 1 2   10. Getting into/out of a car 3 4 4   11. Walking 2 blocks 1 2 2   12. Walking 1 mile 0 1 0  13. Going up/down 10 stairs (1 flight) 1 2 2   14. Standing for 1 hour 2 1 2   15.  sitting for 1 hour 1 1 3   16. Running on even ground 0 1 0  17. Running on uneven ground 0 1 0  18. Making sharp turns while running fast 0 1 0  19. Hopping  0 1 0  20. Rolling over in bed 2 3 3   Score total:  25 37 40     COGNITIVE STATUS: Within functional limits for tasks assessed   SENSATION: Eval: numbness on left lateral thigh  EDEMA:  No  POSTURE:  Eval: wearing heel lift in left shoe Thoracic levoscoliosis with Rt shoulder drop Tends to sit with Lt leg crossed over Rt    GAIT: Comments: bilateral scissoring, Lt trendelenburg   Body Part #1 Hip   LOWER EXTREMITY ROM:     Eval: demo full available ROM through LEs   LOWER EXTREMITY MMT:     EVAL: gross MMT 4/5 in straight plane testing, is able to demo glut med activation in Lt SLS while using UE support and required cuing, unable to hold  TREATMENT DATE:   11/22/24 Nu step L5 x67min Passive HSS  Passive piriformis stretching STM to L glutes in sidelying S//l clams 2x20L Cook bridge 2x10ea 3second hold Prone hip extension with knee flexed 2x10ea (challenging) Hooklying adductor squeeze 5 x20  SLR 3x5 bilateral  Hip hike with bilat UE support 3x10 Sit to stands 3x10 (staggered)   11/17/24 Nu step L5 x83min  Gait:  Trendelenburg gait with R hip drop.  Pt has a heel strike.  Improved scissoring though began to when she fatigued.   MMT:   Hip flexion:  R:  5/5, L:  4+/5 Knee extension:  5/5 bilat Hip abd:  R:  16.8, L:  20.6  Standing hip hikes 2x10  with UE support Standing hip abd with UE support Staggered sit to stands from table (with elevation) L LE back  2x10 Supine bridge 2x15  Pt completed LEFS.  See above   11/10/24 Nu step L5 x15min Supine bridge 2x15 Hooklying adductor squeeze 5 x20  Hooklying clam GTB x30 SLR 2x5 bilateral Hip hike with bilat UE support 2x10 Sit to stands 2x10 (staggered) Manual Therapy:  STM/IASTM to L ITB and quad HEP  Previous: Reviewed pt presentation, pain level, and response to prior treatment.   Pt ambulates with SPC and has a trendelenburg gait with R hip drop.  Nustep lvl 3 bilat UE/LE's x 5 mins Supine bridge 2x15 Seated hip abd isometric with 5 sec hold approx 10 reps Standing Hip abduction 3x5 reps bilat Hip hike with bilat UE support 2x10  Manual Therapy:  STM to L ITB and glute in R S/L'ing with pillow b/w knees    PATIENT EDUCATION:  Education details: exercise form/rationale, objective findings, progress Person educated: Patient Education method: Explanation, Demonstration, Tactile cues, and Verbal cues Education comprehension: verbalized understanding, returned demonstration, verbal cues required, tactile cues required, and needs further education  HOME EXERCISE PROGRAM: Heel strike + glut set to decrease scissoring, SLS with hip hike  Access Code: [email or web address] URL: [email or web address] Date: 11/10/2024 Prepared by: Asberry Rodes  Exercises - Supine ITB Stretch with Strap  - 1 x daily - 7 x weekly - 3 sets - 10 reps - Roller Massage Elongated IT Band Release  - 1-2 x daily - 7 x weekly - hold - Supine Bridge  - 2 x daily - 7 x weekly - 3 sets - 10 reps - Supine Active Straight Leg Raise  - 1-2 x daily - 7 x weekly - 2-3 sets - 5 reps - Hip Hiking on Step  - 2 x daily - 7 x weekly - 3 sets - 10 reps - Staggered Sit-to-Stand  - 2 x daily - 7 x weekly - 2-3 sets - 10 reps  ASSESSMENT:  CLINICAL IMPRESSION: Tender to palpation of L gluteal mm  during STM. She tolerated MT well without c/o increased pain. Focused on [facility] and MNR with exercises for glute firing. She fatigues very quickly in glute mm with exercises, especially prone hip extension with knee flexed. Able ot progress repetitions with most exercises compared to last session. Cuing provided for proper muscle activation/correct technique. Educated on DOMS and management provided.     REHAB POTENTIAL: Good  CLINICAL DECISION MAKING: Stable/uncomplicated  EVALUATION COMPLEXITY: Low   GOALS: Goals reviewed with patient? Yes  SHORT TERM GOALS: Target date: 11/05/24  Able to demo heel strike consistently in ambulation Baseline: Goal status: GOAL MET  1/29  2.  Demo step up in pool with level pelvis  Baseline:  Goal status: INITIAL  3.  Prepared for transition to land-based rehab after 4 weeks of aquatics Baseline: may need to extend goal date depending on scheduling Goal status: GOAL MET  1/29    LONG TERM GOALS: Target date: POC date  Ambulation without scissoring Baseline:  Goal status: PROGRESSING   1/29  2.  Able to stand for at least 10 min pain <=3/10 Baseline:  Goal status: ONGOING  3.  LEFS to improve by MDC Baseline:  Goal status: GOAL MET  4.  Return to yoga without limitation by hip pain Baseline:  Goal status:  GOAL MET  1/29    PLAN:  PT FREQUENCY: 1-2x/week  PT DURATION: 4-5 weeks  PLANNED INTERVENTIONS: 97164- PT Re-evaluation, 97750- Physical Performance Testing, 97110-Therapeutic exercises, 97530- Therapeutic activity, 97112- Neuromuscular re-education, 97535- Self Care, [id number]- Manual therapy, [phone]- Gait training, [phone]- Aquatic Therapy, [phone] (1-2 muscles), [phone] (3+ muscles)- Dry Needling, Patient/Family education, Balance training, Stair training, Taping, Joint mobilization, Spinal mobilization, Scar mobilization, Cryotherapy, and Moist heat.  PLAN FOR NEXT SESSION: continue land based therapy  Safiyya Stokes, PTA  11/22/24  9:15 AM   [facility] [address] [place], [place], [address] Phone: [phone]   Fax:  [phone]  "

## 2024-11-24 ENCOUNTER — Ambulatory Visit (HOSPITAL_BASED_OUTPATIENT_CLINIC_OR_DEPARTMENT_OTHER): Admitting: Physical Therapy

## 2024-11-24 ENCOUNTER — Encounter (HOSPITAL_BASED_OUTPATIENT_CLINIC_OR_DEPARTMENT_OTHER): Payer: Self-pay | Admitting: Physical Therapy

## 2024-11-24 DIAGNOSIS — R262 Difficulty in walking, not elsewhere classified: Secondary | ICD-10-CM

## 2024-11-24 DIAGNOSIS — M25552 Pain in left hip: Secondary | ICD-10-CM

## 2024-11-24 DIAGNOSIS — M6281 Muscle weakness (generalized): Secondary | ICD-10-CM

## 2024-11-24 NOTE — Therapy (Signed)
 " OUTPATIENT PHYSICAL THERAPY TREATMENT    Patient Name: Regina Kim MRN: [id number] DOB:[birth year], 74 y.o., female Today's Date: 11/25/2024  END OF SESSION:  PT End of Session - 11/24/24 0832     Visit Number 12    Number of Visits 20    Date for Recertification  12/17/24    Authorization Type MCR    PT Start Time 0806    PT Stop Time 0847    PT Time Calculation (min) 41 min    Activity Tolerance Patient tolerated treatment well    Behavior During Therapy [facility] for tasks assessed/performed                  Past Medical History:  Diagnosis Date   Allergy    Angina    history of angina- none in 5 yrs   Arthritis    Asthma    years ago & related to GERD   Cataract    growing now   Complication of anesthesia 30 yrs ago   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   Diabetes mellitus    dx'd 4 yrs ago-NIDDM   GERD (gastroesophageal reflux disease)    Hyperlipidemia    controlled on medicines    Myocardial infarction (HCC) 2005   no blockage found in heart cath   PONV (postoperative nausea and vomiting)    SUI (stress urinary incontinence, female) 05/08/2011   Urine protein increased    UTI (urinary tract infection)    Past Surgical History:  Procedure Laterality Date   ABDOMINAL HYSTERECTOMY     ANTERIOR LAT LUMBAR FUSION Left 07/14/2019   Procedure: Left Lumbar two-three Anterolateral decompression/lateral plate fixation;  Surgeon: Colon Shove, MD;  Location: [facility] OR;  Service: Neurosurgery;  Laterality: Left;   BACK SURGERY     BLADDER SURGERY  05/08/2011   Bladder Sling   BLADDER SURGERY  05/08/2011   Bladder Sling   BLADDER SUSPENSION  05/08/2011   Procedure: TRANSVAGINAL TAPE (TVT) PROCEDURE;  Surgeon: Lynwood FORBES Clubs II;  Location: [facility];  Service: Gynecology;  Laterality: N/A;  Transobturator Tape With Cystoscopy   CARDIAC CATHETERIZATION     COLONOSCOPY     CYSTOSCOPY     left total hip replacment Left 09/25/2023   POLYPECTOMY      TOTAL HIP ARTHROPLASTY  03/31/2012   Procedure: TOTAL HIP ARTHROPLASTY;  Surgeon: Dempsey JINNY Sensor, MD;  Location: [facility] OR;  Service: Orthopedics;  Laterality: Right;   Patient Active Problem List   Diagnosis Date Noted   S/P total left hip arthroplasty 10/03/2024   S/P reverse total shoulder arthroplasty, right 10/03/2024   Chronic pain syndrome 10/03/2024   It band syndrome, left 10/03/2024   Weakness of left hip 10/03/2024   Hamstring tightness of both lower extremities 10/03/2024   Chronic pain of left ankle 10/03/2024   Sensorineural hearing loss, bilateral 11/11/2023   Lumbar stenosis with neurogenic claudication 07/14/2019   Avascular necrosis of hip (HCC) 04/02/2012   SUI (stress urinary incontinence, female) 05/08/2011     REFERRING PROVIDER:  Emeline Joesph BROCKS, DO      REFERRING DIAG:  [phone] (ICD-10-CM) - S/P total left hip arthroplasty  R29.898 (ICD-10-CM) - Weakness of left hip  M62.9 (ICD-10-CM) - Hamstring tightness of both lower extremities  Evaluate and treat. Aquatherapy for left hip stabilizer weakness s/p posterior THA, and bilaterla hamstring tightness. Work on strengthening and balancing proximal LE muscles, stretching, and gait stability   Rationale  for Evaluation and Treatment: Rehabilitation  THERAPY DIAG:  Pain in left hip  Difficulty in walking, not elsewhere classified  Muscle weakness (generalized)  ONSET DATE: THA 09/25/23   SUBJECTIVE:                                                                                                                                                                                           SUBJECTIVE STATEMENT: Pt reports she was sore after prior treatment.  Pt states her R HS is sore to touch.  Pt reports she was having increased pain at 8 PM last night.  She had to lay down due to hurting.  Her ITB was also hurting and she used a lidocaine  patch.  She took a hot bath which helped.  Pt also took a muscle relaxer.         PERTINENT HISTORY:  Per MD note: multiple Lt hip dislocations with spontaneous reduction since THA, about 6 wks ago incr pain with pain to ankle and intermittent Lt groin pain;  H/o L5-S1 laminectomy, fusions at L3-5. Was in PT for 1 year since THA.  Rt reverse TSA in June 2025.   L THA with anterior approx 09/25/2023 R THA with posterior approach  2013   PAIN:  Are you having pain? Yes: NPRS scale:  3/10  /  2/10   Pain location:  posterolateral bilat hips, anterolateral Lt ankle /  bilat quad Pain description: nagging ache Aggravating factors: standing still  Relieving factors: the more I am up and moving  PRECAUTIONS:  None  RED FLAGS: None   WEIGHT BEARING RESTRICTIONS:  No  FALLS:  Has patient fallen in last 6 months? No  LIVING ENVIRONMENT: Only stairs are to get into/out of the house  OCCUPATION:  Retired, surveyor, minerals  PLOF:  Independent  PATIENT GOALS:  Yoga, glass work, decrease pain    OBJECTIVE:  Note: Objective measures were completed at Evaluation unless otherwise noted.  DIAGNOSTIC FINDINGS:  Lt Hip MRI: FINDINGS: Soft tissue and Muscle:   Bilateral hip arthroplasties with associated susceptibility artifact, which limits evaluation of the surrounding bone and soft tissues. No discrete periarticular collection. Asymmetric atrophy of the left gluteus medius and minimus muscles relative to the right. No significant intramuscular edema. The gluteal cuff insertions are not well evaluated due to susceptibility artifact. Fluid signal at the level of the left gluteus minimus and medius cuff insertions. Partial-thickness insertional tears of the left gluteal cuff insertions can not be excluded. No significant tendon retraction. Thickening of the left gluteal-iliotibial aponeurosis with surrounding edema, as can be seen in the setting of proximal friction syndrome.  Fluid signal also noted at the level of the right gluteus  minimus and medius cuff insertions, partial-thickness insertional tears can not be excluded.Partial-thickness undersurface tear of the right hamstring tendon origin.   Status post hysterectomy. Sigmoid colonic diverticulosis. Visualized intrapelvic contents are otherwise grossly unremarkable   Bones/Hip:   Postoperative changes related to bilateral hip arthroplasties with associated susceptibility artifact, which limits evaluation of the surrounding bone and soft tissues. Within these limitations, no acute osseous abnormality is identified. No significant convincing marrow signal abnormality surrounding the bilateral hip arthroplasty components. Sacroiliac joints and pubic symphysis are anatomically aligned with mild degenerative changes. Degenerative changes of the visualized lower lumbar spine.   IMPRESSION: 1. Postoperative changes related to bilateral hip arthroplasties with associated susceptibility artifact, which limits evaluation of the surrounding bone and soft tissues. 2. Fluid signal at the level of the left gluteus minimus and medius cuff insertions. Partial-thickness insertional tears of the left gluteal cuff insertions can not be excluded. No significant tendon retraction. 3. Thickening of the overlying left gluteal-iliotibial band aponeurosis with surrounding edema, as can be seen in the setting of proximal friction syndrome. 4. Fluid signal also noted at the level of the right gluteus minimus and medius cuff insertions; partial-thickness insertional tears can not be excluded. 5. Partial-thickness undersurface tear of the right hamstring tendon origin.  PATIENT SURVEYS:  LEFS  Extreme difficulty/unable (0), Quite a bit of difficulty (1), Moderate difficulty (2), Little difficulty (3), No difficulty (4) Survey date:  12/18 10/31/24 11/17/24  Any of your usual work, housework or school activities 2 2 3   2. Usual hobbies, recreational or sporting activities 2 3 2    3. Getting into/out of the bath 1 1 1   4. Walking between rooms 2 2 4   5. Putting on socks/shoes 2 4 4   6. Squatting  1 1 1   7. Lifting an object, like a bag of groceries from the floor 2 3 4   8. Performing light activities around your home 2 2 3   9. Performing heavy activities around your home 1 1 2   10. Getting into/out of a car 3 4 4   11. Walking 2 blocks 1 2 2   12. Walking 1 mile 0 1 0  13. Going up/down 10 stairs (1 flight) 1 2 2   14. Standing for 1 hour 2 1 2   15.  sitting for 1 hour 1 1 3   16. Running on even ground 0 1 0  17. Running on uneven ground 0 1 0  18. Making sharp turns while running fast 0 1 0  19. Hopping  0 1 0  20. Rolling over in bed 2 3 3   Score total:  25 37 40     COGNITIVE STATUS: Within functional limits for tasks assessed   SENSATION: Eval: numbness on left lateral thigh  EDEMA:  No  POSTURE:  Eval: wearing heel lift in left shoe Thoracic levoscoliosis with Rt shoulder drop Tends to sit with Lt leg crossed over Rt    GAIT: Comments: bilateral scissoring, Lt trendelenburg   Body Part #1 Hip   LOWER EXTREMITY ROM:     Eval: demo full available ROM through LEs   LOWER EXTREMITY MMT:     EVAL: gross MMT 4/5 in straight plane testing, is able to demo glut med activation in Lt SLS while using UE support and required cuing, unable to hold  TREATMENT DATE:   11/24/24 Nu step L5 x41min bilat UE/Les Pt received STM to L glutes and ITB in S/L'ing with pillow b/w knees Supine bridge 2x15 S/L clams 2x20 L LE Sit to stands 2x10 (staggered) from table  Standing hip abd 2x10 Hip hike with bilat UE support 2x10 Marching on airex without UE support except initially for a couple of reps  2x10   11/22/24 Nu step L5 x74min Passive HSS  Passive piriformis stretching STM to L glutes in sidelying S//l clams 2x20L Cook bridge 2x10ea 3second  hold Prone hip extension with knee flexed 2x10ea (challenging) Hooklying adductor squeeze 5 x20  SLR 3x5 bilateral  Hip hike with bilat UE support 3x10 Sit to stands 3x10 (staggered)   11/17/24 Nu step L5 x35min  Gait:  Trendelenburg gait with R hip drop.  Pt has a heel strike.  Improved scissoring though began to when she fatigued.   MMT:   Hip flexion:  R:  5/5, L:  4+/5 Knee extension:  5/5 bilat Hip abd:  R:  16.8, L:  20.6  Standing hip hikes 2x10 with UE support Standing hip abd with UE support Staggered sit to stands from table (with elevation) L LE back  2x10 Supine bridge 2x15  Pt completed LEFS.  See above   11/10/24 Nu step L5 x51min Supine bridge 2x15 Hooklying adductor squeeze 5 x20  Hooklying clam GTB x30 SLR 2x5 bilateral Hip hike with bilat UE support 2x10 Sit to stands 2x10 (staggered) Manual Therapy:  STM/IASTM to L ITB and quad HEP  Previous: Reviewed pt presentation, pain level, and response to prior treatment.   Pt ambulates with SPC and has a trendelenburg gait with R hip drop.  Nustep lvl 3 bilat UE/LE's x 5 mins Supine bridge 2x15 Seated hip abd isometric with 5 sec hold approx 10 reps Standing Hip abduction 3x5 reps bilat Hip hike with bilat UE support 2x10  Manual Therapy:  STM to L ITB and glute in R S/L'ing with pillow b/w knees    PATIENT EDUCATION:  Education details: exercise form/rationale, objective findings, progress Person educated: Patient Education method: Explanation, Demonstration, Tactile cues, and Verbal cues Education comprehension: verbalized understanding, returned demonstration, verbal cues required, tactile cues required, and needs further education  HOME EXERCISE PROGRAM: Heel strike + glut set to decrease scissoring, SLS with hip hike  Access Code: [email or web address] URL: [email or web address] Date: 11/10/2024 Prepared by: Asberry Rodes  Exercises - Supine ITB Stretch with Strap  - 1 x daily - 7 x  weekly - 3 sets - 10 reps - Roller Massage Elongated IT Band Release  - 1-2 x daily - 7 x weekly - hold - Supine Bridge  - 2 x daily - 7 x weekly - 3 sets - 10 reps - Supine Active Straight Leg Raise  - 1-2 x daily - 7 x weekly - 2-3 sets - 5 reps - Hip Hiking on Step  - 2 x daily - 7 x weekly - 3 sets - 10 reps - Staggered Sit-to-Stand  - 2 x daily - 7 x weekly - 2-3 sets - 10 reps  ASSESSMENT:  CLINICAL IMPRESSION: Pt is improving with tolerance for land based exercises as evidenced by progression and performance of exercises.  Pt performed exercises to improve glute and functional strength, gait, pain, and mobility.  She performed exercises well with cuing and instruction in correct form.  Pt tolerated treatment well.  Pt states her hip feels better after STM and her ITB  feels better after treatment.  She states her hip feels tired, a little achy, but no increased pain in hips after treatment.      REHAB POTENTIAL: Good  CLINICAL DECISION MAKING: Stable/uncomplicated  EVALUATION COMPLEXITY: Low   GOALS: Goals reviewed with patient? Yes  SHORT TERM GOALS: Target date: 11/05/24  Able to demo heel strike consistently in ambulation Baseline: Goal status: GOAL MET  1/29  2.  Demo step up in pool with level pelvis Baseline:  Goal status: INITIAL  3.  Prepared for transition to land-based rehab after 4 weeks of aquatics Baseline: may need to extend goal date depending on scheduling Goal status: GOAL MET  1/29    LONG TERM GOALS: Target date: POC date  Ambulation without scissoring Baseline:  Goal status: PROGRESSING   1/29  2.  Able to stand for at least 10 min pain <=3/10 Baseline:  Goal status: ONGOING  3.  LEFS to improve by MDC Baseline:  Goal status: GOAL MET  4.  Return to yoga without limitation by hip pain Baseline:  Goal status:  GOAL MET  1/29    PLAN:  PT FREQUENCY: 1-2x/week  PT DURATION: 4-5 weeks  PLANNED INTERVENTIONS: [id number]- PT  Re-evaluation, 97750- Physical Performance Testing, 97110-Therapeutic exercises, 97530- Therapeutic activity, 97112- Neuromuscular re-education, 97535- Self Care, [id number]- Manual therapy, [phone]- Gait training, [phone]- Aquatic Therapy, [phone] (1-2 muscles), [phone] (3+ muscles)- Dry Needling, Patient/Family education, Balance training, Stair training, Taping, Joint mobilization, Spinal mobilization, Scar mobilization, Cryotherapy, and Moist heat.  PLAN FOR NEXT SESSION: continue land based therapy  Leigh Minerva III PT, DPT 11/25/24 10:08 AM    [facility] [address] [place], [place], [address] Phone: [phone]   Fax:  [phone]  "

## 2024-11-29 ENCOUNTER — Ambulatory Visit (HOSPITAL_BASED_OUTPATIENT_CLINIC_OR_DEPARTMENT_OTHER)

## 2024-12-01 ENCOUNTER — Encounter (HOSPITAL_BASED_OUTPATIENT_CLINIC_OR_DEPARTMENT_OTHER): Admitting: Physical Therapy

## 2024-12-05 ENCOUNTER — Encounter: Admitting: Physical Medicine and Rehabilitation

## 2024-12-06 ENCOUNTER — Encounter (HOSPITAL_BASED_OUTPATIENT_CLINIC_OR_DEPARTMENT_OTHER): Admitting: Physical Therapy

## 2024-12-08 ENCOUNTER — Encounter (HOSPITAL_BASED_OUTPATIENT_CLINIC_OR_DEPARTMENT_OTHER)

## 2024-12-13 ENCOUNTER — Encounter (HOSPITAL_BASED_OUTPATIENT_CLINIC_OR_DEPARTMENT_OTHER): Admitting: Physical Therapy

## 2024-12-15 ENCOUNTER — Encounter (HOSPITAL_BASED_OUTPATIENT_CLINIC_OR_DEPARTMENT_OTHER): Admitting: Physical Therapy
